# Patient Record
Sex: Female | Born: 1990 | Race: White | Hispanic: No | Smoking: Former smoker
Health system: Southern US, Community
[De-identification: ages and names within clinical notes are randomized; demographics above are authoritative.]

## PROBLEM LIST (undated history)

## (undated) ENCOUNTER — Inpatient Hospital Stay (HOSPITAL_COMMUNITY): Payer: Self-pay

## (undated) ENCOUNTER — Inpatient Hospital Stay (HOSPITAL_COMMUNITY)

## (undated) ENCOUNTER — Emergency Department (HOSPITAL_COMMUNITY): Payer: Medicaid Other

## (undated) DIAGNOSIS — IMO0002 Reserved for concepts with insufficient information to code with codable children: Secondary | ICD-10-CM

## (undated) DIAGNOSIS — T7411XA Adult physical abuse, confirmed, initial encounter: Secondary | ICD-10-CM

## (undated) DIAGNOSIS — F4312 Post-traumatic stress disorder, chronic: Secondary | ICD-10-CM

## (undated) DIAGNOSIS — R112 Nausea with vomiting, unspecified: Secondary | ICD-10-CM

## (undated) DIAGNOSIS — D219 Benign neoplasm of connective and other soft tissue, unspecified: Secondary | ICD-10-CM

## (undated) DIAGNOSIS — F410 Panic disorder [episodic paroxysmal anxiety] without agoraphobia: Secondary | ICD-10-CM

## (undated) DIAGNOSIS — F32A Depression, unspecified: Secondary | ICD-10-CM

## (undated) DIAGNOSIS — N390 Urinary tract infection, site not specified: Secondary | ICD-10-CM

## (undated) DIAGNOSIS — M329 Systemic lupus erythematosus, unspecified: Secondary | ICD-10-CM

## (undated) DIAGNOSIS — Z9889 Other specified postprocedural states: Secondary | ICD-10-CM

## (undated) DIAGNOSIS — F419 Anxiety disorder, unspecified: Secondary | ICD-10-CM

## (undated) DIAGNOSIS — F329 Major depressive disorder, single episode, unspecified: Secondary | ICD-10-CM

## (undated) HISTORY — DX: Adult physical abuse, confirmed, initial encounter: T74.11XA

## (undated) HISTORY — DX: Panic disorder (episodic paroxysmal anxiety): F41.0

## (undated) HISTORY — PX: CHOLECYSTECTOMY: SHX55

## (undated) HISTORY — DX: Post-traumatic stress disorder, chronic: F43.12

## (undated) HISTORY — PX: FOREARM SURGERY: SHX651

---
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
## 2011-01-18 ENCOUNTER — Inpatient Hospital Stay (HOSPITAL_COMMUNITY)
Admission: AD | Admit: 2011-01-18 | Discharge: 2011-01-18 | Disposition: A | Discharge status: Home / Self Care | Payer: Self-pay | Source: Ambulatory Visit | Attending: Obstetrics & Gynecology | Admitting: Obstetrics & Gynecology

## 2011-01-18 ENCOUNTER — Inpatient Hospital Stay (HOSPITAL_COMMUNITY): Payer: Self-pay

## 2011-01-18 DIAGNOSIS — N940 Mittelschmerz: Secondary | ICD-10-CM | POA: Insufficient documentation

## 2011-01-18 DIAGNOSIS — N946 Dysmenorrhea, unspecified: Secondary | ICD-10-CM | POA: Insufficient documentation

## 2011-01-18 DIAGNOSIS — R109 Unspecified abdominal pain: Secondary | ICD-10-CM | POA: Insufficient documentation

## 2011-01-18 LAB — CBC
HCT: 39.4 % (ref 36.0–46.0)
Hemoglobin: 13.2 g/dL (ref 12.0–15.0)
MCH: 30.8 pg (ref 26.0–34.0)
MCV: 92.1 fL (ref 78.0–100.0)
RBC: 4.28 MIL/uL (ref 3.87–5.11)

## 2011-01-18 LAB — URINALYSIS, ROUTINE W REFLEX MICROSCOPIC
Bilirubin Urine: NEGATIVE
Glucose, UA: NEGATIVE mg/dL
Ketones, ur: NEGATIVE mg/dL
pH: 6.5 (ref 5.0–8.0)

## 2011-01-18 LAB — WET PREP, GENITAL

## 2011-01-18 MED ORDER — KETOROLAC TROMETHAMINE 10 MG PO TABS: 10.0000 mg | ORAL_TABLET | Freq: Four times a day (QID) | ORAL | Status: AC | PRN

## 2011-01-18 NOTE — Progress Notes (Signed)
Pt called from triage, no response.  No longer visiting pt in rm 7

## 2011-01-18 NOTE — ED Provider Notes (Signed)
History   The patient is a 20 y.o. year old female who presents to [facility] reporting increased, constant cramping after removal of Implanon 6 weeks ago. Pain primarily suprapubic and > on right. She reports having this pain prior to removal of the Implanon, but the pain has now increased. She  reports having a GI work-up w/ Dx IBS, but does not think that the pain is GI-related. She denies risk of STD exposure, Hx STDs or vaginal discharge. The pain is 4-8/10, well-controlled w/ Ibuprofen, but the pt worries about regular Ibuprofen use. 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 She declines pain meds at this visit.   CSN: [id number] Arrival date & time: 01/18/2011  1:17 PM  Chief Complaint  Patient presents with  . Vaginal Bleeding  . Dysmenorrhea    HPI  (Consider location/radiation/quality/duration/timing/severity/associated sxs/prior treatment)  HPI  No past medical history on file. Rx pain med dependence  No past surgical history on file.  No family history on file.  History  Substance Use Topics  . Smoking status: Not on file  . Smokeless tobacco: Not on file  . Alcohol Use: Not on file    OB History    No data available      Review of Systems  Review of Systems  She denies dysuria, frequency, flank pain, fever, chills vomiting , diarrhea or constipation. She reports a few episodes of nausea.    Allergies  Review of patient's allergies indicates no known allergies.  Home Medications  No current outpatient prescriptions on file.  Physical Exam    BP 113/76  Pulse 87  Temp(Src) 98.6 F (37 C) (Oral)  Resp 20  Ht 5' 5.5" (1.664 m)  Wt 82.328 kg (181 lb 8 oz)  BMI 29.74 kg/m2  LMP 01/09/2011 Abdomen: Soft, NT, no masses, guarding. +BS. No CVAT Pelvic: BUS normal. Vagina no lesions, discharge or VB. No CMT. Mild suprapubic and R adnexal tenderness. Cervix non-friable, no discharge. Uterus: Normal size, ML, NT. Ovaries non-palpable  US Transvaginal  Non-ob  01/18/2011  *RADIOLOGY REPORT*  Clinical Data: Right lower quadrant pelvic pain.  TRANSABDOMINAL AND TRANSVAGINAL ULTRASOUND OF PELVIS Technique:  Both transabdominal and transvaginal ultrasound examinations of the pelvis were performed. Transabdominal technique was performed for global imaging of the pelvis including uterus, ovaries, adnexal regions, and pelvic cul-de-sac.  Comparison: None.   It was necessary to proceed with endovaginal exam following the transabdominal exam to visualize the left ovary, not seen transabdominally.  Findings:  Uterus: 5.8 x 3.8 x 2.4 cm.  Anteverted, anteflexed.  No focal abnormality.  Endometrium: 7 mm.  Uniformly trilaminar in appearance.  Right ovary:  2.5 x 1.6 x 1.6 centimeters.  Normal.  Left ovary: 3.0 x 1.2 x 1.1 cm.  Normal.  Other findings: No free fluid  IMPRESSION: Normal study. No evidence of pelvic mass or other significant abnormality.  Original Report Authenticated By: Harrel Lemon, M.D.   Physical Exam  ED Course  Procedures (including critical care time)   Labs Reviewed  URINALYSIS, ROUTINE W REFLEX MICROSCOPIC  POCT PREGNANCY, URINE  CBC  POCT PREGNANCY, URINE  POCT URINE PREGNANCY  GC/CHLAMYDIA PROBE AMP, GENITAL  WET PREP, GENITAL    Assessment: 1. Dysmenorrhea/mittelschmertz vs ?interstitial cyctitis vs IBS  Plan: 1. If pain continues after three months off Implanon, recommend F/U w/ PCP. If pain worsens emergently, go to [facility] or [facility] PRN. 2. Rx Toradol #20. Discussed non-pharmacologic pain management

## 2011-01-18 NOTE — Progress Notes (Signed)
Pt states, " I had my implant taken out 6 wks ago, and I had a light period for 2 days  (August 15 th) . I started having cramping pain 3 1/2 wk after taking it out, and it has gotten worse in the past 2 wks and more on my right lower side.  I had another two day period ( Sept 15) .

## 2013-01-01 ENCOUNTER — Encounter (HOSPITAL_COMMUNITY): Payer: Self-pay

## 2013-01-01 ENCOUNTER — Emergency Department (HOSPITAL_COMMUNITY)
Admission: EM | Admit: 2013-01-01 | Discharge: 2013-01-01 | Disposition: A | Discharge status: Home / Self Care | Payer: Medicaid Other | Attending: Emergency Medicine | Admitting: Emergency Medicine

## 2013-01-01 ENCOUNTER — Emergency Department (HOSPITAL_COMMUNITY): Payer: Medicaid Other

## 2013-01-01 DIAGNOSIS — F3289 Other specified depressive episodes: Secondary | ICD-10-CM | POA: Insufficient documentation

## 2013-01-01 DIAGNOSIS — R319 Hematuria, unspecified: Secondary | ICD-10-CM | POA: Insufficient documentation

## 2013-01-01 DIAGNOSIS — F329 Major depressive disorder, single episode, unspecified: Secondary | ICD-10-CM | POA: Insufficient documentation

## 2013-01-01 DIAGNOSIS — Z79899 Other long term (current) drug therapy: Secondary | ICD-10-CM | POA: Insufficient documentation

## 2013-01-01 DIAGNOSIS — F411 Generalized anxiety disorder: Secondary | ICD-10-CM | POA: Insufficient documentation

## 2013-01-01 HISTORY — DX: Major depressive disorder, single episode, unspecified: F32.9

## 2013-01-01 HISTORY — DX: Anxiety disorder, unspecified: F41.9

## 2013-01-01 HISTORY — DX: Depression, unspecified: F32.A

## 2013-01-01 LAB — URINALYSIS, ROUTINE W REFLEX MICROSCOPIC
Ketones, ur: NEGATIVE mg/dL
Nitrite: NEGATIVE
Specific Gravity, Urine: 1.008 (ref 1.005–1.030)
pH: 7.5 (ref 5.0–8.0)

## 2013-01-01 LAB — URINE MICROSCOPIC-ADD ON

## 2013-01-01 MED ORDER — CEPHALEXIN 500 MG PO CAPS: 500.0000 mg | ORAL_CAPSULE | Freq: Four times a day (QID) | ORAL | Status: DC

## 2013-01-01 NOTE — ED Provider Notes (Signed)
CSN: [id number]     Arrival date & time 01/01/13  0554 History   First MD Initiated Contact with Patient 01/01/13 [phone]     Chief Complaint  Patient presents with  . Flank Pain   (Consider location/radiation/quality/duration/timing/severity/associated sxs/prior Treatment) Patient is a 22 y.o. female presenting with dysuria. The history is provided by the patient. No language interpreter was used.  Dysuria Pain quality:  Aching Pain severity:  Mild Onset quality:  Gradual Duration:  1 week Timing:  Constant Progression:  Worsening Chronicity:  New Recent urinary tract infections: no   Relieved by:  Nothing Worsened by:  Nothing tried Ineffective treatments: keflex and macrobid. Urinary symptoms: frequent urination and hematuria   Pt reports she was diagnosed with a uti 1 week ago.  Pt complains of continued frequency, pressure sensation.  Pt complains of burning with urination  Past Medical History  Diagnosis Date  . Anxiety   . Depression    Past Surgical History  Procedure Laterality Date  . Forearm surgery     No family history on file. History  Substance Use Topics  . Smoking status: Never Smoker   . Smokeless tobacco: Not on file  . Alcohol Use: No   OB History   Grav Para Term Preterm Abortions TAB SAB Ect Mult Living   1              Review of Systems  Genitourinary: Positive for dysuria and frequency.  All other systems reviewed and are negative.    Allergies  Hydrocodone and Morphine and related  Home Medications   Current Outpatient Rx  Name  Route  Sig  Dispense  Refill  . acetaminophen (TYLENOL) 500 MG tablet   Oral   Take 500 mg by mouth every 6 (six) hours as needed. Patient took this medication for pain.          Marland Kitchen aspirin-acetaminophen-caffeine (EXCEDRIN MIGRAINE) 250-250-65 MG per tablet   Oral   Take 1 tablet by mouth every 6 (six) hours as needed.           . diazepam (VALIUM) 10 MG tablet   Oral   Take 10 mg by mouth every 6  (six) hours as needed.           . meclizine (ANTIVERT) 12.5 MG tablet   Oral   Take 12.5 mg by mouth 3 (three) times daily as needed.           . traZODone (DESYREL) 50 MG tablet   Oral   Take 50 mg by mouth at bedtime.            BP 135/77  Pulse 78  Temp(Src) 98 F (36.7 C)  Resp 18  SpO2 96% Physical Exam  Nursing note and vitals reviewed. Constitutional: She appears well-developed and well-nourished.  HENT:  Head: Normocephalic.  Right Ear: External ear normal.  Left Ear: External ear normal.  Nose: Nose normal.  Mouth/Throat: Oropharynx is clear and moist.  Eyes: Conjunctivae are normal. Pupils are equal, round, and reactive to light.  Neck: Normal range of motion. Neck supple.  Cardiovascular: Normal rate and normal heart sounds.   Pulmonary/Chest: Effort normal.  Abdominal: Soft. Bowel sounds are normal.  Musculoskeletal: Normal range of motion.  Neurological: She is alert.  Skin: Skin is warm.  Psychiatric: She has a normal mood and affect.    ED Course  Procedures (including critical care time) Labs Review Labs Reviewed  URINALYSIS, ROUTINE W REFLEX MICROSCOPIC  Imaging Review No results found.  MDM   1. Hematuria    Urine shows rbc's  Tntc.  Ultrasound shows no hydro, no evidence of stones.    Pt advised continue keflex.   Pt advised to see her OB/Gyn for follow up,  Keflex 500mg  one po qid Pt request pain medication.  Pt reports hydrocodone not strong enough.   I counseled pt,  I advised tylenol.   I don not think narcotics for appropriate for discomforts of pregnancy.     Lonia Skinner [place], PA-C 01/01/13 1308  Elson Areas, PA-C 01/01/13 0824  Elson Areas, PA-C 01/01/13 [phone]

## 2013-01-01 NOTE — ED Notes (Signed)
0600  Pt arrives from home with right flank pain.  Pt was diagnosed with kidney infection at [facility] 1 week ago and was admitted and discharged on Friday.  Pt also thinks she is allergic to her antibiotic.  Pt does not feel any better.  Also has nausea with painful urination still

## 2013-01-01 NOTE — ED Notes (Signed)
Returned from [place]-- continues to c/o pain in right flank area. States was sent home with Vicodin-- doesn't like taking it, because it makes her feel like she has pins and needles in hands and feet.

## 2013-01-01 NOTE — ED Notes (Signed)
Report received from Andrew, RN

## 2013-01-03 NOTE — ED Provider Notes (Signed)
Medical screening examination/treatment/procedure(s) were performed by non-physician practitioner and as supervising physician I was immediately available for consultation/collaboration.  Nyriah Coote R. Lailah Marcelli, MD 01/03/13 2344 

## 2014-02-25 ENCOUNTER — Encounter (HOSPITAL_COMMUNITY): Payer: Self-pay

## 2015-09-17 DIAGNOSIS — R011 Cardiac murmur, unspecified: Secondary | ICD-10-CM | POA: Insufficient documentation

## 2015-10-01 ENCOUNTER — Encounter: Payer: Self-pay | Admitting: Cardiovascular Disease

## 2015-10-02 ENCOUNTER — Telehealth: Payer: Self-pay | Admitting: Cardiovascular Disease

## 2015-10-02 NOTE — Telephone Encounter (Signed)
Received records from [facility] for appointment on 10/16/15 with Dr Gwenlyn Found.  Records given to [facility] (medical records) for Dr Kennon Holter schedule on 10/16/15. lp

## 2015-10-07 ENCOUNTER — Ambulatory Visit: Payer: Medicaid Other | Admitting: Cardiovascular Disease

## 2015-10-16 ENCOUNTER — Encounter: Payer: Self-pay | Admitting: *Deleted

## 2015-10-16 ENCOUNTER — Ambulatory Visit: Payer: Medicaid Other | Admitting: Cardiovascular Disease

## 2016-10-27 ENCOUNTER — Emergency Department (HOSPITAL_COMMUNITY)
Admission: EM | Admit: 2016-10-27 | Discharge: 2016-10-28 | Disposition: A | Discharge status: Home / Self Care | Payer: Medicaid Other | Attending: Emergency Medicine | Admitting: Emergency Medicine

## 2016-10-27 ENCOUNTER — Emergency Department (HOSPITAL_COMMUNITY): Payer: Medicaid Other

## 2016-10-27 ENCOUNTER — Encounter (HOSPITAL_COMMUNITY): Payer: Self-pay | Admitting: Vascular Surgery

## 2016-10-27 DIAGNOSIS — Z87891 Personal history of nicotine dependence: Secondary | ICD-10-CM | POA: Diagnosis not present

## 2016-10-27 DIAGNOSIS — Z79899 Other long term (current) drug therapy: Secondary | ICD-10-CM | POA: Diagnosis not present

## 2016-10-27 DIAGNOSIS — R0789 Other chest pain: Secondary | ICD-10-CM

## 2016-10-27 DIAGNOSIS — Y9389 Activity, other specified: Secondary | ICD-10-CM | POA: Diagnosis not present

## 2016-10-27 DIAGNOSIS — S161XXA Strain of muscle, fascia and tendon at neck level, initial encounter: Secondary | ICD-10-CM | POA: Diagnosis not present

## 2016-10-27 DIAGNOSIS — G4489 Other headache syndrome: Secondary | ICD-10-CM

## 2016-10-27 DIAGNOSIS — Y929 Unspecified place or not applicable: Secondary | ICD-10-CM | POA: Insufficient documentation

## 2016-10-27 DIAGNOSIS — R079 Chest pain, unspecified: Secondary | ICD-10-CM | POA: Diagnosis present

## 2016-10-27 DIAGNOSIS — E876 Hypokalemia: Secondary | ICD-10-CM | POA: Diagnosis not present

## 2016-10-27 DIAGNOSIS — Y999 Unspecified external cause status: Secondary | ICD-10-CM | POA: Diagnosis not present

## 2016-10-27 LAB — BASIC METABOLIC PANEL
Anion gap: 7 (ref 5–15)
BUN: 6 mg/dL (ref 6–20)
CALCIUM: 9.2 mg/dL (ref 8.9–10.3)
CHLORIDE: 110 mmol/L (ref 101–111)
CO2: 24 mmol/L (ref 22–32)
CREATININE: 0.65 mg/dL (ref 0.44–1.00)
Glucose, Bld: 83 mg/dL (ref 65–99)
Potassium: 2.9 mmol/L — ABNORMAL LOW (ref 3.5–5.1)
SODIUM: 141 mmol/L (ref 135–145)

## 2016-10-27 LAB — CBC
HCT: 40.6 % (ref 36.0–46.0)
HEMOGLOBIN: 13.2 g/dL (ref 12.0–15.0)
MCH: 29.8 pg (ref 26.0–34.0)
MCHC: 32.5 g/dL (ref 30.0–36.0)
MCV: 91.6 fL (ref 78.0–100.0)
Platelets: 203 10*3/uL (ref 150–400)
RBC: 4.43 MIL/uL (ref 3.87–5.11)
RDW: 13.2 % (ref 11.5–15.5)
WBC: 8.1 10*3/uL (ref 4.0–10.5)

## 2016-10-27 LAB — I-STAT BETA HCG BLOOD, ED (MC, WL, AP ONLY)

## 2016-10-27 LAB — I-STAT TROPONIN, ED: TROPONIN I, POC: 0 ng/mL (ref 0.00–0.08)

## 2016-10-27 MED ORDER — IOPAMIDOL (ISOVUE-300) INJECTION 61%
INTRAVENOUS | Status: AC
  Administered 2016-10-27: 75 mL
  Filled 2016-10-27: qty 75

## 2016-10-27 MED ORDER — POTASSIUM CHLORIDE 10 MEQ/100ML IV SOLN
10.0000 meq | Freq: Once | INTRAVENOUS | Status: AC
  Administered 2016-10-27: 10 meq via INTRAVENOUS
  Filled 2016-10-27: qty 100

## 2016-10-27 MED ORDER — IOPAMIDOL (ISOVUE-300) INJECTION 61%
INTRAVENOUS | Status: AC
  Filled 2016-10-27: qty 75

## 2016-10-27 MED ORDER — SODIUM CHLORIDE 0.9 % IV SOLN
Freq: Once | INTRAVENOUS | Status: AC
  Administered 2016-10-27: via INTRAVENOUS

## 2016-10-27 MED ORDER — ONDANSETRON HCL 4 MG/2ML IJ SOLN
4.0000 mg | Freq: Once | INTRAMUSCULAR | Status: AC
  Administered 2016-10-27: 4 mg via INTRAVENOUS
  Filled 2016-10-27: qty 2

## 2016-10-27 MED ORDER — KETOROLAC TROMETHAMINE 15 MG/ML IJ SOLN
15.0000 mg | Freq: Once | INTRAMUSCULAR | Status: AC
  Administered 2016-10-28: 15 mg via INTRAVENOUS
  Filled 2016-10-27: qty 1

## 2016-10-27 NOTE — ED Notes (Signed)
Patient transported to CT 
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
## 2016-10-27 NOTE — ED Notes (Signed)
Pt now reporting to NP that she is experiencing some chest heaviness and mild SOB.

## 2016-10-28 MED ORDER — POTASSIUM CHLORIDE CRYS ER 20 MEQ PO TBCR: 20.0000 meq | EXTENDED_RELEASE_TABLET | Freq: Two times a day (BID) | ORAL | 0 refills | Status: DC

## 2016-10-28 MED ORDER — CYCLOBENZAPRINE HCL 10 MG PO TABS: 10.0000 mg | ORAL_TABLET | Freq: Two times a day (BID) | ORAL | 0 refills | Status: DC | PRN

## 2016-10-28 MED ORDER — NAPROXEN 500 MG PO TABS: 500.0000 mg | ORAL_TABLET | Freq: Two times a day (BID) | ORAL | 0 refills | Status: DC | PRN

## 2016-10-28 MED ORDER — ONDANSETRON HCL 4 MG PO TABS: 4.0000 mg | ORAL_TABLET | Freq: Four times a day (QID) | ORAL | 0 refills | Status: DC | PRN

## 2016-12-05 ENCOUNTER — Emergency Department (HOSPITAL_COMMUNITY)
Admission: EM | Admit: 2016-12-05 | Discharge: 2016-12-05 | Disposition: A | Discharge status: Home / Self Care | Payer: Medicaid Other | Attending: Emergency Medicine | Admitting: Emergency Medicine

## 2016-12-05 ENCOUNTER — Encounter (HOSPITAL_COMMUNITY): Payer: Self-pay

## 2016-12-05 ENCOUNTER — Emergency Department (HOSPITAL_COMMUNITY): Payer: Medicaid Other

## 2016-12-05 DIAGNOSIS — Z885 Allergy status to narcotic agent status: Secondary | ICD-10-CM | POA: Insufficient documentation

## 2016-12-05 DIAGNOSIS — K8020 Calculus of gallbladder without cholecystitis without obstruction: Secondary | ICD-10-CM | POA: Diagnosis not present

## 2016-12-05 DIAGNOSIS — Z79899 Other long term (current) drug therapy: Secondary | ICD-10-CM | POA: Insufficient documentation

## 2016-12-05 DIAGNOSIS — R1011 Right upper quadrant pain: Secondary | ICD-10-CM

## 2016-12-05 LAB — URINALYSIS, ROUTINE W REFLEX MICROSCOPIC
Bilirubin Urine: NEGATIVE
Glucose, UA: NEGATIVE mg/dL
HGB URINE DIPSTICK: NEGATIVE
KETONES UR: NEGATIVE mg/dL
Leukocytes, UA: NEGATIVE
Nitrite: NEGATIVE
PROTEIN: NEGATIVE mg/dL
Specific Gravity, Urine: 1.01 (ref 1.005–1.030)
pH: 7 (ref 5.0–8.0)

## 2016-12-05 LAB — COMPREHENSIVE METABOLIC PANEL
ALBUMIN: 3.9 g/dL (ref 3.5–5.0)
ALK PHOS: 49 U/L (ref 38–126)
ALT: 36 U/L (ref 14–54)
AST: 31 U/L (ref 15–41)
Anion gap: 7 (ref 5–15)
BUN: 7 mg/dL (ref 6–20)
CALCIUM: 9.2 mg/dL (ref 8.9–10.3)
CO2: 27 mmol/L (ref 22–32)
CREATININE: 0.74 mg/dL (ref 0.44–1.00)
Chloride: 107 mmol/L (ref 101–111)
GFR calc non Af Amer: >60 mL/min (ref >60)
Glucose, Bld: 98 mg/dL (ref 65–99)
Potassium: 3.9 mmol/L (ref 3.5–5.1)
SODIUM: 141 mmol/L (ref 135–145)
Total Bilirubin: 0.9 mg/dL (ref 0.3–1.2)
Total Protein: 7.4 g/dL (ref 6.5–8.1)

## 2016-12-05 LAB — CBC
HCT: 39 % (ref 36.0–46.0)
Hemoglobin: 13 g/dL (ref 12.0–15.0)
MCH: 29.7 pg (ref 26.0–34.0)
MCHC: 33.3 g/dL (ref 30.0–36.0)
MCV: 89.2 fL (ref 78.0–100.0)
PLATELETS: 202 10*3/uL (ref 150–400)
RBC: 4.37 MIL/uL (ref 3.87–5.11)
RDW: 12.7 % (ref 11.5–15.5)
WBC: 5 10*3/uL (ref 4.0–10.5)

## 2016-12-05 LAB — I-STAT BETA HCG BLOOD, ED (MC, WL, AP ONLY)

## 2016-12-05 LAB — LIPASE, BLOOD: Lipase: 30 U/L (ref 11–51)

## 2016-12-05 MED ORDER — FENTANYL CITRATE (PF) 100 MCG/2ML IJ SOLN
50.0000 ug | Freq: Once | INTRAMUSCULAR | Status: AC
  Administered 2016-12-05: 50 ug via INTRAVENOUS
  Filled 2016-12-05: qty 2

## 2016-12-05 MED ORDER — PROMETHAZINE HCL 25 MG PO TABS: 25.0000 mg | ORAL_TABLET | Freq: Four times a day (QID) | ORAL | 0 refills | Status: DC | PRN

## 2016-12-05 MED ORDER — HYDROMORPHONE HCL 1 MG/ML IJ SOLN
1.0000 mg | Freq: Once | INTRAMUSCULAR | Status: AC
  Administered 2016-12-05: 1 mg via INTRAVENOUS
  Filled 2016-12-05: qty 1

## 2016-12-05 MED ORDER — ONDANSETRON HCL 4 MG/2ML IJ SOLN
4.0000 mg | Freq: Once | INTRAMUSCULAR | Status: AC
Start: 2016-12-05 — End: 2016-12-05
  Administered 2016-12-05: 4 mg via INTRAVENOUS
  Filled 2016-12-05: qty 2

## 2016-12-05 MED ORDER — OXYCODONE-ACETAMINOPHEN 5-325 MG PO TABS: 1.0000 | ORAL_TABLET | ORAL | 0 refills | Status: DC | PRN

## 2016-12-05 NOTE — ED Provider Notes (Signed)
[facility] DEPT Provider Note   CSN: [id number] Arrival date & time: 12/05/16  1537     History   Chief Complaint Chief Complaint  Patient presents with  . Abdominal Pain    HPI Felicia Davila is a 26 y.o. female.  The history is provided by the patient and medical records.  Abdominal Pain   Associated symptoms include nausea and vomiting.    25 year old female with history of anxiety and depression, presenting to the ED with right upper quadrant abdominal pain. Patient reports a few years ago when she was pregnant with her son she was diagnosed with gallstones. States she has not had any real issues until the past few days.  States over the past few days she has been having nagging pain in the RUQ that has started now wrapped around to the flank.  She reports associated nausea/vomiting.  States symptoms got worse yesterday after eating [employer].  States no follow-up with the gallstones since pregnancy.  Prior c-section x2, no other surgeries.  Has tried ODT zofran at home without relief.  Past Medical History:  Diagnosis Date  . Anxiety   . Depression     There are no active problems to display for this patient.   Past Surgical History:  Procedure Laterality Date  . CESAREAN SECTION    . FOREARM SURGERY      OB History    Gravida Para Term Preterm AB Living   1             SAB TAB Ectopic Multiple Live Births                   Home Medications    Prior to Admission medications   Medication Sig Start Date End Date Taking? Authorizing Provider  cephALEXin (KEFLEX) 500 MG capsule Take 500 mg by mouth 4 (four) times daily.    [provider]  cephALEXin (KEFLEX) 500 MG capsule Take 1 capsule (500 mg total) by mouth 4 (four) times daily. 01/01/13   Fransico Meadow, PA-C  cyclobenzaprine (FLEXERIL) 10 MG tablet Take 1 tablet (10 mg total) by mouth 2 (two) times daily as needed for muscle spasms. 10/28/16   Ashley Murrain, NP  FLUoxetine (PROZAC) 40 MG  capsule Take 40 mg by mouth daily.    [provider]  naproxen (NAPROSYN) 500 MG tablet Take 1 tablet (500 mg total) by mouth 2 (two) times daily as needed for moderate pain. 10/28/16   Ashley Murrain, NP  nitrofurantoin, macrocrystal-monohydrate, (MACROBID) 100 MG capsule Take 100 mg by mouth 2 (two) times daily.    [provider]  ondansetron (ZOFRAN) 4 MG tablet Take 1 tablet (4 mg total) by mouth every 6 (six) hours as needed for nausea or vomiting. 10/28/16   Ashley Murrain, NP  potassium chloride SA (K-DUR,KLOR-CON) 20 MEQ tablet Take 1 tablet (20 mEq total) by mouth 2 (two) times daily. 10/28/16   Ashley Murrain, NP    Family History History reviewed. No pertinent family history.  Social History Social History  Substance Use Topics  . Smoking status: Former Research scientist (life sciences)  . Smokeless tobacco: Never Used  . Alcohol use No     Allergies   Morphine and related and Hydrocodone   Review of Systems Review of Systems  Gastrointestinal: Positive for abdominal pain, nausea and vomiting.  All other systems reviewed and are negative.    Physical Exam Updated Vital Signs BP 123/81 (BP Location: Right Arm)  Pulse 78   Temp 98.3 F (36.8 C) (Oral)   Resp 16   Ht 5\' 7"  (1.702 m)   Wt 65.8 kg (145 lb)   SpO2 100%   BMI 22.71 kg/m   Physical Exam  Constitutional: She is oriented to person, place, and time. She appears well-developed and well-nourished.  HENT:  Head: Normocephalic and atraumatic.  Mouth/Throat: Oropharynx is clear and moist.  Eyes: Pupils are equal, round, and reactive to light. Conjunctivae and EOM are normal.  Neck: Normal range of motion.  Cardiovascular: Normal rate, regular rhythm and normal heart sounds.   Pulmonary/Chest: Effort normal and breath sounds normal.  Abdominal: Soft. Bowel sounds are normal. There is tenderness in the right upper quadrant. There is positive Murphy's sign. There is no CVA tenderness.  Tenderness in the right upper  quadrant with positive Murphy sign, does appear to be some radiation of pain into the right flank, no focal CVA tenderness  Musculoskeletal: Normal range of motion.  Neurological: She is alert and oriented to person, place, and time.  Skin: Skin is warm and dry.  Psychiatric: She has a normal mood and affect.  Nursing note and vitals reviewed.    ED Treatments / Results  Labs (all labs ordered are listed, but only abnormal results are displayed) Labs Reviewed  URINALYSIS, ROUTINE W REFLEX MICROSCOPIC - Abnormal; Notable for the following:       Result Value   APPearance HAZY (*)    All other components within normal limits  LIPASE, BLOOD  COMPREHENSIVE METABOLIC PANEL  CBC  I-STAT BETA HCG BLOOD, ED ([facility], [facility], [facility] ONLY)    EKG  EKG Interpretation None       Radiology US Abdomen Limited Ruq  Result Date: 12/05/2016 CLINICAL DATA:  Right upper quadrant abdominal pain EXAM: ULTRASOUND ABDOMEN LIMITED RIGHT UPPER QUADRANT COMPARISON:  10/19/2014 FINDINGS: Gallbladder: Shadowing gallstone present. Slightly contracted. This measures up to 1.2 cm. Mild wall thickening up to 3.7 mm. Negative sonographic Murphy's. Common bile duct: Diameter: 1.5 mm Liver: Increased echogenicity.  No focal abnormality is seen. IMPRESSION: 1. Cholelithiasis. Gallbladder slightly contracted which may be contributing to slightly thickened appearance of the gallbladder wall, although acute or chronic cholecystitis could also produce this appearance. No focal tenderness elicited. Negative for biliary dilatation. 2. Echogenic liver consistent with fatty change Electronically Signed   By: Donavan Foil M.D.   On: 12/05/2016 18:21    Procedures Procedures (including critical care time)  Medications Ordered in ED Medications  fentaNYL (SUBLIMAZE) injection 50 mcg (50 mcg Intravenous Given 12/05/16 1732)  ondansetron (ZOFRAN) injection 4 mg (4 mg Intravenous Given 12/05/16 1730)  HYDROmorphone (DILAUDID) injection  1 mg (1 mg Intravenous Given 12/05/16 1912)     Initial Impression / Assessment and Plan / ED Course  I have reviewed the triage vital signs and the nursing notes.  Pertinent labs & imaging results that were available during my care of the patient were reviewed by me and considered in my medical decision making (see chart for details).  26 year old female here with right upper quadrant pain for the past few days, worse after eating [employer] yesterday. She is afebrile and nontoxic in appearance here. Abdominal exam with tenderness in the right upper quadrant, positive Murphy sign. Screening labs were sent from triage and are overall reassuring. She has history of gallstones, will order ultrasound.  Pain and nausea meds ordered.  Ultrasound revealing gallstones, gallbladder slightly contracted, mildly thickened appearance of the gallbladder wall. Question of  acute versus chronically cystitis. Patient received second dose of pain medicine and is now comfortable. She's not had any active emesis here. Will discuss with general surgery.  7:45 PM Discussed with general surgery, Dr. Grandville Silos-- agrees that patient stable enough to go home as there is full case load/pushed cases from earlier today that will occupy schedule for the next 2 days.  She can follow-up in clinic on Monday.  Discussed this with patient, she is comfortable well with this. We'll plan to discharge home with pain and nausea medicine. She understands to return here for any new or worsening symptoms including worsening pain, uncontrolled vomiting, trouble eating, high fever, etc. Patient discharged home in stable condition.  Final Clinical Impressions(s) / ED Diagnoses   Final diagnoses:  RUQ pain  Symptomatic cholelithiasis    New Prescriptions Discharge Medication List as of 12/05/2016  7:57 PM    START taking these medications   Details  oxyCODONE-acetaminophen (PERCOCET) 5-325 MG tablet Take 1 tablet by mouth every 4 (four)  hours as needed., Starting Sun 12/05/2016, Print    promethazine (PHENERGAN) 25 MG tablet Take 1 tablet (25 mg total) by mouth every 6 (six) hours as needed for nausea or vomiting., Starting Sun 12/05/2016, Print         Larene Pickett, PA-C 12/05/16 2118    Julianne Rice, MD 12/10/16 2348

## 2016-12-05 NOTE — Discharge Instructions (Signed)
Take the prescribed medication as directed.  Follow low-fat diet for the next few days to plan worsening flares. Follow-up with the surgery clinic tomorrow if possible.: The morning to schedule an appointment. Return to the ED for new or worsening symptoms-- increased pain, uncontrolled vomiting, high fever, etc.

## 2016-12-05 NOTE — ED Triage Notes (Signed)
Pt states she has had RUQ pain for several days. She reports the pain has gotten gradually worse to the point of not being able to lift her children. Pt states hx of gall stones. N/v as well. Pt reports minimal relief with zofran at home.

## 2016-12-09 ENCOUNTER — Encounter (HOSPITAL_COMMUNITY): Payer: Self-pay | Admitting: Emergency Medicine

## 2016-12-09 DIAGNOSIS — R112 Nausea with vomiting, unspecified: Secondary | ICD-10-CM | POA: Insufficient documentation

## 2016-12-09 DIAGNOSIS — R109 Unspecified abdominal pain: Secondary | ICD-10-CM | POA: Diagnosis present

## 2016-12-09 DIAGNOSIS — Z885 Allergy status to narcotic agent status: Secondary | ICD-10-CM | POA: Diagnosis not present

## 2016-12-09 LAB — I-STAT BETA HCG BLOOD, ED (MC, WL, AP ONLY): I-stat hCG, quantitative: <5 m[IU]/mL (ref <5)

## 2016-12-09 LAB — CBC
HEMATOCRIT: 35.2 % — AB (ref 36.0–46.0)
Hemoglobin: 11.6 g/dL — ABNORMAL LOW (ref 12.0–15.0)
MCH: 29.4 pg (ref 26.0–34.0)
MCHC: 33 g/dL (ref 30.0–36.0)
MCV: 89.3 fL (ref 78.0–100.0)
PLATELETS: 191 10*3/uL (ref 150–400)
RBC: 3.94 MIL/uL (ref 3.87–5.11)
RDW: 12.3 % (ref 11.5–15.5)
WBC: 4.3 10*3/uL (ref 4.0–10.5)

## 2016-12-09 MED ORDER — OXYCODONE-ACETAMINOPHEN 5-325 MG PO TABS
1.0000 | ORAL_TABLET | Freq: Once | ORAL | Status: AC
  Administered 2016-12-09: 1 via ORAL

## 2016-12-09 MED ORDER — ONDANSETRON 4 MG PO TBDP
4.0000 mg | ORAL_TABLET | Freq: Once | ORAL | Status: AC | PRN
  Administered 2016-12-09: 4 mg via ORAL

## 2016-12-09 MED ORDER — ONDANSETRON 4 MG PO TBDP
ORAL_TABLET | ORAL | Status: AC
  Filled 2016-12-09: qty 1

## 2016-12-09 MED ORDER — OXYCODONE-ACETAMINOPHEN 5-325 MG PO TABS
ORAL_TABLET | ORAL | Status: AC
  Filled 2016-12-09: qty 1

## 2016-12-09 NOTE — ED Triage Notes (Signed)
Patient reports upper abdominal pain with nausea this evening , S/P cholecystectomy last Monday at [facility] , fever today , afebrile at triage . No diarrhea or emesis .

## 2016-12-10 ENCOUNTER — Emergency Department (HOSPITAL_COMMUNITY)
Admission: EM | Admit: 2016-12-10 | Discharge: 2016-12-10 | Disposition: A | Discharge status: Home / Self Care | Payer: Medicaid Other | Attending: Emergency Medicine | Admitting: Emergency Medicine

## 2016-12-10 ENCOUNTER — Emergency Department (HOSPITAL_COMMUNITY): Payer: Medicaid Other

## 2016-12-10 DIAGNOSIS — R109 Unspecified abdominal pain: Secondary | ICD-10-CM

## 2016-12-10 DIAGNOSIS — R112 Nausea with vomiting, unspecified: Secondary | ICD-10-CM

## 2016-12-10 LAB — URINALYSIS, ROUTINE W REFLEX MICROSCOPIC
Bilirubin Urine: NEGATIVE
Glucose, UA: NEGATIVE mg/dL
Hgb urine dipstick: NEGATIVE
Ketones, ur: NEGATIVE mg/dL
LEUKOCYTES UA: NEGATIVE
Nitrite: NEGATIVE
PROTEIN: NEGATIVE mg/dL
Specific Gravity, Urine: 1.018 (ref 1.005–1.030)
pH: 5 (ref 5.0–8.0)

## 2016-12-10 LAB — COMPREHENSIVE METABOLIC PANEL
ALBUMIN: 3.9 g/dL (ref 3.5–5.0)
ALT: 51 U/L (ref 14–54)
ANION GAP: 8 (ref 5–15)
AST: 44 U/L — ABNORMAL HIGH (ref 15–41)
Alkaline Phosphatase: 45 U/L (ref 38–126)
BILIRUBIN TOTAL: 0.8 mg/dL (ref 0.3–1.2)
BUN: <5 mg/dL — ABNORMAL LOW (ref 6–20)
CO2: 27 mmol/L (ref 22–32)
Calcium: 8.8 mg/dL — ABNORMAL LOW (ref 8.9–10.3)
Chloride: 105 mmol/L (ref 101–111)
Creatinine, Ser: 0.74 mg/dL (ref 0.44–1.00)
Glucose, Bld: 105 mg/dL — ABNORMAL HIGH (ref 65–99)
POTASSIUM: 3.4 mmol/L — AB (ref 3.5–5.1)
Sodium: 140 mmol/L (ref 135–145)
TOTAL PROTEIN: 6.4 g/dL — AB (ref 6.5–8.1)

## 2016-12-10 LAB — LIPASE, BLOOD: Lipase: 27 U/L (ref 11–51)

## 2016-12-10 MED ORDER — IOPAMIDOL (ISOVUE-300) INJECTION 61%
INTRAVENOUS | Status: AC
  Administered 2016-12-10: 100 mL
  Filled 2016-12-10: qty 100

## 2016-12-10 MED ORDER — ACETAMINOPHEN 500 MG PO TABS: 1000.0000 mg | ORAL_TABLET | Freq: Four times a day (QID) | ORAL | 0 refills | Status: DC

## 2016-12-10 MED ORDER — IOPAMIDOL (ISOVUE-300) INJECTION 61%
INTRAVENOUS | Status: AC
  Filled 2016-12-10: qty 30

## 2016-12-10 MED ORDER — FENTANYL CITRATE (PF) 100 MCG/2ML IJ SOLN
50.0000 ug | Freq: Once | INTRAMUSCULAR | Status: AC
  Administered 2016-12-10: 50 ug via INTRAVENOUS
  Filled 2016-12-10: qty 2

## 2016-12-10 MED ORDER — SODIUM CHLORIDE 0.9 % IV BOLUS (SEPSIS)
1000.0000 mL | Freq: Once | INTRAVENOUS | Status: AC
  Administered 2016-12-10: 1000 mL via INTRAVENOUS

## 2016-12-10 MED ORDER — IBUPROFEN 600 MG PO TABS: 600.0000 mg | ORAL_TABLET | Freq: Three times a day (TID) | ORAL | 0 refills | Status: DC | PRN

## 2016-12-10 MED ORDER — PROMETHAZINE HCL 25 MG/ML IJ SOLN
25.0000 mg | Freq: Once | INTRAMUSCULAR | Status: AC
  Administered 2016-12-10: 25 mg via INTRAVENOUS
  Filled 2016-12-10: qty 1

## 2016-12-10 MED ORDER — FENTANYL CITRATE (PF) 100 MCG/2ML IJ SOLN
75.0000 ug | Freq: Once | INTRAMUSCULAR | Status: AC
  Administered 2016-12-10: 75 ug via INTRAVENOUS
  Filled 2016-12-10: qty 2

## 2016-12-10 MED ORDER — ONDANSETRON 4 MG PO TBDP: 4.0000 mg | ORAL_TABLET | Freq: Three times a day (TID) | ORAL | 0 refills | Status: DC | PRN

## 2016-12-10 MED ORDER — HYDROCODONE-ACETAMINOPHEN 5-325 MG PO TABS: 1.0000 | ORAL_TABLET | Freq: Four times a day (QID) | ORAL | 0 refills | Status: DC | PRN

## 2016-12-10 MED ORDER — ONDANSETRON HCL 4 MG/2ML IJ SOLN
4.0000 mg | Freq: Once | INTRAMUSCULAR | Status: AC
  Administered 2016-12-10: 4 mg via INTRAVENOUS
  Filled 2016-12-10: qty 2

## 2016-12-10 NOTE — ED Provider Notes (Signed)
[facility] DEPT Provider Note   CSN: [id number] Arrival date & time: 12/09/16  2300     History   Chief Complaint Chief Complaint  Patient presents with  . Abdominal Pain    HPI Felicia Davila is a 26 y.o. female presenting with acute worsening abdominal pain.  Patient states she had a cholecystectomy on Monday, 8/14 at [facility]. There are no consultations with the surgery, and she was discharged home. She has been doing well, until last night when she developed worsening abdominal pain. She states she has had 4 episodes of very sharp and severe epigastric abdominal pain. 2 of the episodes were associated with solid food intake, and the other 2 were associated with spray intake. She reports the pain is very sharp, and then fades on its own. Nothing makes it better or worse. She is not sure if her at-home pain medication was helping. She reports associated nausea and vomiting with these episodes, and has had chills throughout the night. She denies fevers, chest pain, shortness of breath, urinary symptoms, or abnormal bowel movements. She does not have any blood in her stool. She was eating well prior to these events last night. Besides recent surgery, she has no other risk factors for PE including nonsmoker, no mobilization, no pain or swelling of calves, no chest pain, no tachycardia, no estrogen use.  HPI  Past Medical History:  Diagnosis Date  . Anxiety   . Depression     There are no active problems to display for this patient.   Past Surgical History:  Procedure Laterality Date  . CESAREAN SECTION    . CHOLECYSTECTOMY    . FOREARM SURGERY      OB History    Gravida Para Term Preterm AB Living   1             SAB TAB Ectopic Multiple Live Births                   Home Medications    Prior to Admission medications   Medication Sig Start Date End Date Taking? Authorizing Provider  oxyCODONE-acetaminophen (PERCOCET) 5-325 MG tablet Take 1 tablet by mouth  every 4 (four) hours as needed. 12/05/16  Yes Larene Pickett, PA-C  promethazine (PHENERGAN) 25 MG tablet Take 1 tablet (25 mg total) by mouth every 6 (six) hours as needed for nausea or vomiting. 12/05/16  Yes Larene Pickett, PA-C  acetaminophen (TYLENOL) 500 MG tablet Take 2 tablets (1,000 mg total) by mouth every 6 (six) hours. 12/10/16   Suliman Termini, PA-C  HYDROcodone-acetaminophen (NORCO/VICODIN) 5-325 MG tablet Take 1 tablet by mouth every 6 (six) hours as needed for severe pain. 12/10/16   Andre Gallego, PA-C  ibuprofen (ADVIL,MOTRIN) 600 MG tablet Take 1 tablet (600 mg total) by mouth every 8 (eight) hours as needed for moderate pain. 12/10/16   Contrell Ballentine, PA-C  ondansetron (ZOFRAN-ODT) 4 MG disintegrating tablet Take 1 tablet (4 mg total) by mouth every 8 (eight) hours as needed for nausea or vomiting. 12/10/16   Janace Decker, PA-C    Family History No family history on file.  Social History Social History  Substance Use Topics  . Smoking status: Former Research scientist (life sciences)  . Smokeless tobacco: Never Used  . Alcohol use No     Allergies   Morphine and related and Hydrocodone   Review of Systems Review of Systems  Constitutional: Positive for chills. Negative for fever.  HENT: Negative for sore throat.   Eyes:  Negative for photophobia and visual disturbance.  Respiratory: Negative for cough and shortness of breath.   Cardiovascular: Negative for chest pain, palpitations and leg swelling.  Gastrointestinal: Positive for abdominal pain, nausea and vomiting. Negative for blood in stool, constipation and diarrhea.  Genitourinary: Negative for dysuria, flank pain, frequency and hematuria.  Musculoskeletal: Negative for arthralgias and neck pain.  Skin: Negative for wound.  Neurological: Negative for light-headedness and headaches.  Hematological: Does not bruise/bleed easily.  Psychiatric/Behavioral: Negative for confusion.     Physical Exam Updated Vital Signs BP  105/65 (BP Location: Left Arm)   Davila (!) 54   Temp 98.3 F (36.8 C) (Oral)   Resp 16   Ht 5\' 7"  (1.702 m)   Wt 67.1 kg (148 lb)   LMP 06/12/2016   SpO2 100%   BMI 23.18 kg/m   Physical Exam  Constitutional: She is oriented to person, place, and time. She appears well-developed and well-nourished. No distress.  HENT:  Head: Normocephalic and atraumatic.  Eyes: Pupils are equal, round, and reactive to light. EOM are normal.  Neck: Normal range of motion.  Cardiovascular: Normal rate, regular rhythm and intact distal pulses.   Pulmonary/Chest: Effort normal and breath sounds normal. No respiratory distress. She has no wheezes. She exhibits no tenderness.  Abdominal: Soft. Bowel sounds are normal. She exhibits no distension and no mass. There is tenderness in the epigastric area. There is no rigidity, no rebound, no guarding, no CVA tenderness, no tenderness at McBurney's point and negative Murphy's sign.  Tenderness of the epigastric and right upper quadrant abdomen. No obvious masses, bruising, or distention. Patient with 4 well healing laparoscopic incisions, without signs of cellulitis including drainage or erythema.  Musculoskeletal: Normal range of motion.  Lymphadenopathy:    She has no cervical adenopathy.  Neurological: She is alert and oriented to person, place, and time.  Skin: Skin is warm and dry. She is not diaphoretic.  Psychiatric: She has a normal mood and affect.  Nursing note and vitals reviewed.    ED Treatments / Results  Labs (all labs ordered are listed, but only abnormal results are displayed) Labs Reviewed  COMPREHENSIVE METABOLIC PANEL - Abnormal; Notable for the following:       Result Value   Potassium 3.4 (*)    Glucose, Bld 105 (*)    BUN <5 (*)    Calcium 8.8 (*)    Total Protein 6.4 (*)    AST 44 (*)    All other components within normal limits  CBC - Abnormal; Notable for the following:    Hemoglobin 11.6 (*)    HCT 35.2 (*)    All other  components within normal limits  URINALYSIS, ROUTINE W REFLEX MICROSCOPIC - Abnormal; Notable for the following:    APPearance HAZY (*)    All other components within normal limits  LIPASE, BLOOD  I-STAT BETA HCG BLOOD, ED ([facility], [facility], [facility] ONLY)    EKG  EKG Interpretation None       Radiology Ct Abdomen Pelvis W Contrast  Result Date: 12/10/2016 CLINICAL DATA:  Acute upper abdominal pain. EXAM: CT ABDOMEN AND PELVIS WITH CONTRAST TECHNIQUE: Multidetector CT imaging of the abdomen and pelvis was performed using the standard protocol following bolus administration of intravenous contrast. CONTRAST:  131mL ISOVUE-300 IOPAMIDOL (ISOVUE-300) INJECTION 61% COMPARISON:  CT scan of May 02, 2012. FINDINGS: Lower chest: No acute abnormality. Hepatobiliary: No focal liver abnormality is seen. Status post cholecystectomy. No biliary dilatation. Pancreas: Unremarkable. No  pancreatic ductal dilatation or surrounding inflammatory changes. Spleen: Normal in size without focal abnormality. Adrenals/Urinary Tract: Adrenal glands are unremarkable. Kidneys are normal, without renal calculi, focal lesion, or hydronephrosis. Bladder is unremarkable. Stomach/Bowel: Stomach is within normal limits. Appendix appears normal. No evidence of bowel wall thickening, distention, or inflammatory changes. Vascular/Lymphatic: No significant vascular findings are present. No enlarged abdominal or pelvic lymph nodes. Reproductive: Intrauterine device is noted. Other: Moderate amount of free fluid is seen in the pelvis and adnexal regions. Small amount of pneumoperitoneum is noted in the epigastric region as well as in the anterior subcutaneous tissues of the epigastric region consistent with recent laparoscopic surgery. Musculoskeletal: No acute or significant osseous findings. IMPRESSION: Findings are noted in the epigastric region consistent with the history of recent laparoscopic cholecystectomy. Moderate amount of free fluid is  also noted in the pelvis consistent with postoperative status. No abnormal fluid collection is seen in the gallbladder fossa. No other significant abnormality seen in the abdomen or pelvis. Electronically Signed   By: Marijo Conception, M.D.   On: 12/10/2016 12:15    Procedures Procedures (including critical care time)  Medications Ordered in ED Medications  ondansetron (ZOFRAN-ODT) 4 MG disintegrating tablet (not administered)  oxyCODONE-acetaminophen (PERCOCET/ROXICET) 5-325 MG per tablet (not administered)  ondansetron (ZOFRAN-ODT) disintegrating tablet 4 mg (4 mg Oral Given 12/09/16 2321)  oxyCODONE-acetaminophen (PERCOCET/ROXICET) 5-325 MG per tablet 1 tablet (1 tablet Oral Given 12/09/16 2321)  sodium chloride 0.9 % bolus 1,000 mL (0 mLs Intravenous Stopped 12/10/16 1216)  fentaNYL (SUBLIMAZE) injection 75 mcg (75 mcg Intravenous Given 12/10/16 0902)  ondansetron (ZOFRAN) injection 4 mg (4 mg Intravenous Given 12/10/16 0901)  promethazine (PHENERGAN) injection 25 mg (25 mg Intravenous Given 12/10/16 1014)  fentaNYL (SUBLIMAZE) injection 50 mcg (50 mcg Intravenous Given 12/10/16 1018)  iopamidol (ISOVUE-300) 61 % injection (100 mLs  Contrast Given 12/10/16 1143)     Initial Impression / Assessment and Plan / ED Course  I have reviewed the triage vital signs and the nursing notes.  Pertinent labs & imaging results that were available during my care of the patient were reviewed by me and considered in my medical decision making (see chart for details).     Patient presented with acute onset abdominal pain status post laparoscopic cholecystectomy on 8/14. Physical exam shows tenderness to palpation of epigastric and right upper quadrant area. No obvious infection around incision sites. Vital signs and reassuring as patient is afebrile, not tachycardic, and not hypotensive. Lab work reassuring, no leukocytosis, and mild anemia expected after surgery. Case discussed with attending, Dr. Leonette Monarch  evaluated the patient. Will order CT to ensure no infection, free fluid, free air, or other acute abdominal pathology.  Patient resting pain medicine and further assistance with nausea. Will give Phenergan and reorder fentanyl.  CT negative. At this time, doubt infectious etiology, perforation, obstx, or intraabd bleeding. Doubt PE or other pulm cause. Discussed findings with patient. Will ensure that she is able to tolerate by mouth intake prior to discharge. Will dc home with scheduled Tylenol and Norco for breakthrough pain. Zofran as needed. Patient to follow-up with surgeon. Discussed importance of diet for symptom control. At this time, patient appears safe for discharge. Return precautions given. Patient states she understands and agrees to plan.  Final Clinical Impressions(s) / ED Diagnoses   Final diagnoses:  Acute abdominal pain  Non-intractable vomiting with nausea, unspecified vomiting type    New Prescriptions Discharge Medication List as of 12/10/2016 12:41 PM  START taking these medications   Details  acetaminophen (TYLENOL) 500 MG tablet Take 2 tablets (1,000 mg total) by mouth every 6 (six) hours., Starting Fri 12/10/2016, Print    HYDROcodone-acetaminophen (NORCO/VICODIN) 5-325 MG tablet Take 1 tablet by mouth every 6 (six) hours as needed for severe pain., Starting Fri 12/10/2016, Print         Severn, Aziah Brostrom, PA-C 12/10/16 1755    Fatima Blank, MD 12/15/16 [phone]

## 2016-12-10 NOTE — ED Notes (Signed)
Patient transported to CT 

## 2016-12-10 NOTE — ED Notes (Signed)
Pt drinking oral contrast, c/o worsening abdominal pain, nausea. PA made aware, will order meds.

## 2016-12-10 NOTE — Discharge Instructions (Signed)
Use Zofran as needed for nausea or vomiting. He may take Norco as needed for severe pain. Continue taking Tylenol as scheduled to help prevent pain. Try to eat small meals of the day. Avoid fatty, spicy, or acidic foods. It is important that you follow-up with your surgeon for further evaluation of your pain and symptoms. Return to the emergency room if you develop fever, chills, persistent vomiting, swelling of your abdomen, or any new or worsening symptoms.

## 2016-12-10 NOTE — ED Notes (Signed)
PO challenge. Water given at this time.

## 2017-02-01 DIAGNOSIS — D447 Neoplasm of uncertain behavior of aortic body and other paraganglia: Secondary | ICD-10-CM | POA: Insufficient documentation

## 2018-06-25 ENCOUNTER — Ambulatory Visit (HOSPITAL_COMMUNITY)
Admission: EM | Admit: 2018-06-25 | Discharge: 2018-06-25 | Disposition: A | Discharge status: Home / Self Care | Payer: Self-pay | Attending: Family Medicine | Admitting: Family Medicine

## 2018-06-25 ENCOUNTER — Encounter (HOSPITAL_COMMUNITY): Payer: Self-pay | Admitting: Emergency Medicine

## 2018-06-25 DIAGNOSIS — M329 Systemic lupus erythematosus, unspecified: Secondary | ICD-10-CM

## 2018-06-25 HISTORY — DX: Reserved for concepts with insufficient information to code with codable children: IMO0002

## 2018-06-25 HISTORY — DX: Systemic lupus erythematosus, unspecified: M32.9

## 2018-06-25 MED ORDER — METHYLPREDNISOLONE 4 MG PO TBPK: ORAL_TABLET | ORAL | 0 refills | Status: DC

## 2018-06-25 NOTE — Discharge Instructions (Signed)
Take the medrol as directed Take all of day one today

## 2018-06-25 NOTE — ED Triage Notes (Addendum)
Pt states she has lupus and shes had joint pain x5 days, feels like a flair up, states her medicaid is out so she is in between doctors at the moment. Pt states shes been taking old prednisone 10mg  pills for the last few days which has helped.

## 2018-06-25 NOTE — ED Provider Notes (Signed)
[place]    CSN: [id number] Arrival date & time: 06/25/18  1334     History   Chief Complaint Chief Complaint  Patient presents with  . Joint Pain    HPI Felicia Davila is a 28 y.o. female.   HPI  Patient is here for body aches.  She feels very tired.  She feels that she is having a flare of her lupus.  She states that she ate green peppers for 5 days ago.  She thinks that this triggered her reaction.  Shortly after eating the pepper she developed a rash across her cheeks.  Then the body aches.  She is tried taking some over-the-counter anti-inflammatories.  She tried taking some old prednisone pills.  The prednisone helped somewhat.  She is currently not under the care of a rheumatologist on any suppressive medication  Past Medical History:  Diagnosis Date  . Anxiety   . Depression   . Lupus ([place])     There are no active problems to display for this patient.   Past Surgical History:  Procedure Laterality Date  . CESAREAN SECTION    . CHOLECYSTECTOMY    . FOREARM SURGERY      OB History    Gravida  1   Para      Term      Preterm      AB      Living        SAB      TAB      Ectopic      Multiple      Live Births               Home Medications    Prior to Admission medications   Medication Sig Start Date End Date Taking? Authorizing Provider  methylPREDNISolone (MEDROL DOSEPAK) 4 MG TBPK tablet tad 06/25/18   Raylene Everts, MD    Family History Family History  Problem Relation Age of Onset  . Lupus Father     Social History Social History   Tobacco Use  . Smoking status: Former Research scientist (life sciences)  . Smokeless tobacco: Never Used  Substance Use Topics  . Alcohol use: No  . Drug use: No     Allergies   Morphine and related; Hydrocodone; and Macrobid [nitrofurantoin]   Review of Systems Review of Systems  Constitutional: Positive for fatigue. Negative for chills and fever.  HENT: Negative for ear pain and sore  throat.   Eyes: Negative for pain and visual disturbance.  Respiratory: Negative for cough and shortness of breath.   Cardiovascular: Negative for chest pain and palpitations.  Gastrointestinal: Negative for abdominal pain and vomiting.  Genitourinary: Negative for dysuria and hematuria.  Musculoskeletal: Positive for arthralgias and myalgias. Negative for back pain.  Skin: Positive for rash. Negative for color change.  Neurological: Negative for seizures and syncope.  All other systems reviewed and are negative.    Physical Exam Triage Vital Signs ED Triage Vitals  Enc Vitals Group     BP 06/25/18 1355 119/79     Pulse Rate 06/25/18 1355 86     Resp 06/25/18 1355 16     Temp 06/25/18 1355 98.3 F (36.8 C)     Temp Source 06/25/18 1355 Oral     SpO2 06/25/18 1355 99 %     Weight --      Height --      Head Circumference --      Peak Flow --  Pain Score 06/25/18 1356 7     Pain Loc --      Pain Edu? --      Excl. in [place]? --    No data found.  Updated Vital Signs BP 119/79 (BP Location: Left Arm)   Pulse 86   Temp 98.3 F (36.8 C) (Oral)   Resp 16   SpO2 99%      Physical Exam Constitutional:      General: She is not in acute distress.    Appearance: She is well-developed.  HENT:     Head: Normocephalic and atraumatic.     Comments: Malar rash across cheeks    Right Ear: Tympanic membrane and ear canal normal.     Left Ear: Tympanic membrane and ear canal normal.     Nose: No rhinorrhea.  Eyes:     Conjunctiva/sclera: Conjunctivae normal.     Pupils: Pupils are equal, round, and reactive to light.  Neck:     Musculoskeletal: Normal range of motion.  Cardiovascular:     Rate and Rhythm: Normal rate and regular rhythm.     Heart sounds: Normal heart sounds.  Pulmonary:     Effort: Pulmonary effort is normal. No respiratory distress.     Breath sounds: Normal breath sounds.  Abdominal:     General: There is no distension.     Palpations: Abdomen is  soft.  Musculoskeletal: Normal range of motion.     Comments: No joint swelling or synovitis appreciated  Skin:    General: Skin is warm and dry.  Neurological:     Mental Status: She is alert.      UC Treatments / Results  Labs (all labs ordered are listed, but only abnormal results are displayed) Labs Reviewed - No data to display  EKG None  Radiology No results found.  Procedures Procedures (including critical care time)  Medications Ordered in UC Medications - No data to display  Initial Impression / Assessment and Plan / UC Course  I have reviewed the triage vital signs and the nursing notes.  Pertinent labs & imaging results that were available during my care of the patient were reviewed by me and considered in my medical decision making (see chart for details).     Patient needs a PCP.  She needs to be under the care of a rheumatologist.  I recommend she go to the [facility] health wellness clinic. Final Clinical Impressions(s) / UC Diagnoses   Final diagnoses:  Systemic lupus erythematosus, unspecified SLE type, unspecified organ involvement status [facility])     Discharge Instructions     Take the medrol as directed Take all of day one today   ED Prescriptions    Medication Sig Dispense Auth. Provider   methylPREDNISolone (MEDROL DOSEPAK) 4 MG TBPK tablet tad 21 tablet Raylene Everts, MD     Controlled Substance Prescriptions [place] Controlled Substance Registry consulted? Not Applicable   Raylene Everts, MD 06/25/18 1836

## 2018-12-13 ENCOUNTER — Emergency Department (HOSPITAL_COMMUNITY)
Admission: EM | Admit: 2018-12-13 | Discharge: 2018-12-13 | Disposition: A | Discharge status: Home / Self Care | Payer: Medicaid Other | Attending: Emergency Medicine | Admitting: Emergency Medicine

## 2018-12-13 ENCOUNTER — Encounter (HOSPITAL_COMMUNITY): Payer: Self-pay

## 2018-12-13 ENCOUNTER — Other Ambulatory Visit: Payer: Self-pay

## 2018-12-13 DIAGNOSIS — K029 Dental caries, unspecified: Secondary | ICD-10-CM

## 2018-12-13 DIAGNOSIS — Z87891 Personal history of nicotine dependence: Secondary | ICD-10-CM | POA: Insufficient documentation

## 2018-12-13 MED ORDER — PENICILLIN V POTASSIUM 250 MG PO TABS
500.0000 mg | ORAL_TABLET | Freq: Once | ORAL | Status: AC
Start: 2018-12-13 — End: 2018-12-13
  Administered 2018-12-13: 500 mg via ORAL
  Filled 2018-12-13: qty 2

## 2018-12-13 MED ORDER — OXYCODONE-ACETAMINOPHEN 5-325 MG PO TABS
1.0000 | ORAL_TABLET | Freq: Once | ORAL | Status: AC
  Administered 2018-12-13: 1 via ORAL
  Filled 2018-12-13: qty 1

## 2018-12-13 MED ORDER — NAPROXEN 500 MG PO TABS: 500.0000 mg | ORAL_TABLET | Freq: Two times a day (BID) | ORAL | 0 refills | Status: DC

## 2018-12-13 MED ORDER — PENICILLIN V POTASSIUM 500 MG PO TABS: 500.0000 mg | ORAL_TABLET | Freq: Four times a day (QID) | ORAL | 0 refills | Status: AC

## 2018-12-13 NOTE — ED Provider Notes (Signed)
[place] EMERGENCY DEPARTMENT Provider Note   CSN: [id number] Arrival date & time: 12/13/18  5366    History   Chief Complaint Chief Complaint  Patient presents with  . Dental Pain    HPI Felicia Davila is a 28 y.o. female presenting to the emergency department with 4 days of left upper molar dental pain.  The pain is radiating to her left ear. She states she is planning to be able to get an appointment next week, however her pain is difficult to control with ibuprofen and Goody's powder at home.  She denies fever or swelling,  Difficulty breathing or swallowing.     The history is provided by the patient.    Past Medical History:  Diagnosis Date  . Anxiety   . Depression   . Lupus ([place])     There are no active problems to display for this patient.   Past Surgical History:  Procedure Laterality Date  . CESAREAN SECTION    . CHOLECYSTECTOMY    . FOREARM SURGERY       OB History    Gravida  1   Para      Term      Preterm      AB      Living        SAB      TAB      Ectopic      Multiple      Live Births               Home Medications    Prior to Admission medications   Medication Sig Start Date End Date Taking? Authorizing Provider  methylPREDNISolone (MEDROL DOSEPAK) 4 MG TBPK tablet tad 06/25/18   Raylene Everts, MD  naproxen (NAPROSYN) 500 MG tablet Take 1 tablet (500 mg total) by mouth 2 (two) times daily. 12/13/18   Robinson, [place] N, PA-C  penicillin v potassium (VEETID) 500 MG tablet Take 1 tablet (500 mg total) by mouth 4 (four) times daily for 7 days. 12/14/18 12/21/18  Robinson, [place] N, PA-C    Family History Family History  Problem Relation Age of Onset  . Lupus Father     Social History Social History   Tobacco Use  . Smoking status: Former Research scientist (life sciences)  . Smokeless tobacco: Never Used  Substance Use Topics  . Alcohol use: No  . Drug use: No     Allergies   Morphine and related,  Hydrocodone, and Macrobid [nitrofurantoin]   Review of Systems Review of Systems  Constitutional: Negative for fever.  HENT: Positive for dental problem.      Physical Exam Updated Vital Signs BP 114/75   Pulse 72   Temp 98.5 F (36.9 C) (Oral)   Resp 16   SpO2 98%   Physical Exam Vitals signs and nursing note reviewed.  Constitutional:      General: She is not in acute distress.    Appearance: She is well-developed.  HENT:     Head: Normocephalic and atraumatic.     Left Ear: Tympanic membrane and ear canal normal.     Mouth/Throat:     Comments: Poor dentition throughout.  Left upper last molar with dental carry.  There is no gingival erythema or fluctuance.  Uvula is midline, tolerating secretions.  No facial swelling. Eyes:     Conjunctiva/sclera: Conjunctivae normal.  Pulmonary:     Effort: Pulmonary effort is normal.  Neurological:     Mental Status: She  is alert.  Psychiatric:        Mood and Affect: Mood normal.        Behavior: Behavior normal.      ED Treatments / Results  Labs (all labs ordered are listed, but only abnormal results are displayed) Labs Reviewed - No data to display  EKG None  Radiology No results found.  Procedures Procedures (including critical care time)  Medications Ordered in ED Medications  penicillin v potassium (VEETID) tablet 500 mg (has no administration in time range)  oxyCODONE-acetaminophen (PERCOCET/ROXICET) 5-325 MG per tablet 1 tablet (has no administration in time range)     Initial Impression / Assessment and Plan / ED Course  I have reviewed the triage vital signs and the nursing notes.  Pertinent labs & imaging results that were available during my care of the patient were reviewed by me and considered in my medical decision making (see chart for details).        Patient with dental caries.  No gross abscess.  VSS, afebrile, tolerating secretions. Exam unconcerning for peritonsillar abscess, Ludwig's  angina or spread of infection.  Will treat with penicillin and pain medicine.  Urged patient to follow-up with dentist. Pt safe for discharge.  Discussed results, findings, treatment and follow up. Patient advised of return precautions. Patient verbalized understanding and agreed with plan.   Final Clinical Impressions(s) / ED Diagnoses   Final diagnoses:  Pain due to dental caries    ED Discharge Orders         Ordered    penicillin v potassium (VEETID) 500 MG tablet  4 times daily     12/13/18 2013    naproxen (NAPROSYN) 500 MG tablet  2 times daily     12/13/18 2013           Robinson, [place] N, Hershal Coria 12/13/18 2015    Veryl Speak, MD 12/13/18 2053

## 2018-12-13 NOTE — ED Notes (Signed)
Patient verbalizes understanding of discharge instructions. Opportunity for questioning and answers were provided. Armband removed by staff, pt discharged from ED.  

## 2018-12-13 NOTE — Discharge Instructions (Addendum)
Please read instructions below. Take the antibiotic, Penicillin V, 4 times per day until they are gone. You can take Naproxen up to 2 times per day with meals, as needed for pain. Schedule an appointment with a dentist, using the dental resource guide attached. Return to the ER for difficulty swallowing or breathing, fever, or new or worsening symptoms.

## 2018-12-13 NOTE — ED Triage Notes (Signed)
Onset 1 week ago upper left back molar pain.  Tooth has needed a root canal for about a year but lost insurance.

## 2019-01-28 ENCOUNTER — Other Ambulatory Visit: Payer: Self-pay

## 2019-01-28 ENCOUNTER — Encounter (HOSPITAL_COMMUNITY): Payer: Self-pay | Admitting: Emergency Medicine

## 2019-01-28 ENCOUNTER — Emergency Department (HOSPITAL_COMMUNITY)
Admission: EM | Admit: 2019-01-28 | Discharge: 2019-01-28 | Disposition: A | Discharge status: Home / Self Care | Payer: Self-pay | Attending: Emergency Medicine | Admitting: Emergency Medicine

## 2019-01-28 DIAGNOSIS — K0889 Other specified disorders of teeth and supporting structures: Secondary | ICD-10-CM | POA: Insufficient documentation

## 2019-01-28 DIAGNOSIS — Z79899 Other long term (current) drug therapy: Secondary | ICD-10-CM | POA: Insufficient documentation

## 2019-01-28 DIAGNOSIS — Z87891 Personal history of nicotine dependence: Secondary | ICD-10-CM | POA: Insufficient documentation

## 2019-01-28 MED ORDER — CLINDAMYCIN HCL 150 MG PO CAPS
300.0000 mg | ORAL_CAPSULE | Freq: Once | ORAL | Status: AC
Start: 2019-01-28 — End: 2019-01-28
  Administered 2019-01-28: 300 mg via ORAL
  Filled 2019-01-28: qty 2

## 2019-01-28 MED ORDER — CLINDAMYCIN HCL 150 MG PO CAPS: 150.0000 mg | ORAL_CAPSULE | Freq: Three times a day (TID) | ORAL | 0 refills | Status: AC

## 2019-01-28 MED ORDER — OXYCODONE-ACETAMINOPHEN 5-325 MG PO TABS
1.0000 | ORAL_TABLET | Freq: Once | ORAL | Status: AC
  Administered 2019-01-28: 18:00:00 1 via ORAL
  Filled 2019-01-28: qty 1

## 2019-01-28 NOTE — ED Provider Notes (Signed)
[place] EMERGENCY DEPARTMENT Provider Note   CSN: [id number] Arrival date & time: 01/28/19  1603     History   Chief Complaint Chief Complaint  Patient presents with  . Dental Pain    HPI Felicia Davila is a 28 y.o. female who presents to the ED today complaining of gradual onset, constant, achy, left upper dental pain that began yesterday.  Patient reports she had a tooth pulled 3 days ago.  She states that her pain was controlled until last night.  She could not call them as they are closed over the weekend so she came to the ED instead.  She reports she was on penicillin prior to getting her tooth pulled although she states that the dentist told her after the tooth was pulled that there may still be infection there.  She was not prescribed antibiotics after her tooth was pulled.  Denies fever, chills, facial swelling, sore throat, difficulty swallowing, drainage from tooth, any other associated symptoms.  Patient states she has been taking ibuprofen and Tylenol as needed for the pain and applying ice without much relief.        Past Medical History:  Diagnosis Date  . Anxiety   . Depression   . Lupus ([place])     There are no active problems to display for this patient.   Past Surgical History:  Procedure Laterality Date  . CESAREAN SECTION    . CHOLECYSTECTOMY    . FOREARM SURGERY       OB History    Gravida  1   Para      Term      Preterm      AB      Living        SAB      TAB      Ectopic      Multiple      Live Births               Home Medications    Prior to Admission medications   Medication Sig Start Date End Date Taking? Authorizing Provider  clindamycin (CLEOCIN) 150 MG capsule Take 1 capsule (150 mg total) by mouth 3 (three) times daily for 7 days. 01/28/19 02/04/19  Eustaquio Maize, PA-C  methylPREDNISolone (MEDROL DOSEPAK) 4 MG TBPK tablet tad 06/25/18   Raylene Everts, MD  naproxen (NAPROSYN) 500 MG  tablet Take 1 tablet (500 mg total) by mouth 2 (two) times daily. 12/13/18   Robinson, [place] N, PA-C    Family History Family History  Problem Relation Age of Onset  . Lupus Father     Social History Social History   Tobacco Use  . Smoking status: Former Research scientist (life sciences)  . Smokeless tobacco: Never Used  Substance Use Topics  . Alcohol use: No  . Drug use: No     Allergies   Morphine and related, Hydrocodone, and Macrobid [nitrofurantoin]   Review of Systems Review of Systems  Constitutional: Negative for chills and fever.  HENT: Positive for dental problem. Negative for drooling, ear discharge, ear pain, facial swelling, sore throat and trouble swallowing.      Physical Exam Updated Vital Signs BP 110/71 (BP Location: Left Arm)   Pulse 83   Temp 98.3 F (36.8 C) (Oral)   Resp 12   Ht 5\' 6"  (1.676 m)   Wt 61.2 kg   SpO2 100%   BMI 21.79 kg/m   Physical Exam Vitals signs and nursing note reviewed.  Constitutional:      Appearance: She is not ill-appearing.  HENT:     Head: Normocephalic and atraumatic.     Right Ear: Tympanic membrane normal.     Left Ear: Tympanic membrane normal.     Mouth/Throat:     Comments: Nose clear.  L upper tooth #15 removed; no erythema or edema; no dental abscess appreciated;  Minimal gingival swelling around the area, no evidence of ludwig's.  Oropharynx clear and moist, without uvular swelling or deviation, no trismus or drooling, no tonsillar swelling or erythema, no exudates.    Eyes:     Conjunctiva/sclera: Conjunctivae normal.  Cardiovascular:     Rate and Rhythm: Normal rate and regular rhythm.  Pulmonary:     Effort: Pulmonary effort is normal.     Breath sounds: Normal breath sounds.  Skin:    General: Skin is warm and dry.     Coloration: Skin is not jaundiced.  Neurological:     Mental Status: She is alert.      ED Treatments / Results  Labs (all labs ordered are listed, but only abnormal results are  displayed) Labs Reviewed - No data to display  EKG None  Radiology No results found.  Procedures Procedures (including critical care time)  Medications Ordered in ED Medications  oxyCODONE-acetaminophen (PERCOCET/ROXICET) 5-325 MG per tablet 1 tablet (1 tablet Oral Given 01/28/19 1801)  clindamycin (CLEOCIN) capsule 300 mg (300 mg Oral Given 01/28/19 1801)     Initial Impression / Assessment and Plan / ED Course  I have reviewed the triage vital signs and the nursing notes.  Pertinent labs & imaging results that were available during my care of the patient were reviewed by me and considered in my medical decision making (see chart for details).    -year-old female who presents the ED today complaining of worsening dental pain after getting tooth extracted 3 days ago.  Unable to follow-up with dentist given they are closed for the weekend.  Is afebrile in the ED tachycardia or tachypnea.  Does have some mild gingival swelling around the extracted tooth but no signs of abscess today.  No Ludwigs angina.  She reports she took antibiotics prior to getting tooth extracted -penicillin.  When she is having worsening pain will prescribe clindamycin today to cover her until she can be seen by dentist again.  I have given 1 dose narcotic pain medication in the ED but have advised that I will not be writing for narcotics today.  Patient advised to continue taking ibuprofen and Tylenol as needed for the pain as well as applying ice to the area.   This note was prepared using Dragon voice recognition software and may include unintentional dictation errors due to the inherent limitations of voice recognition software.       Final Clinical Impressions(s) / ED Diagnoses   Final diagnoses:  Pain, dental    ED Discharge Orders         Ordered    clindamycin (CLEOCIN) 150 MG capsule  3 times daily     01/28/19 1820           Eustaquio Maize, PA-C 01/28/19 1820    Elnora Morrison,  MD 01/30/19 [phone]

## 2019-01-28 NOTE — Discharge Instructions (Signed)
Please take antibiotics as prescribed Follow up with your dentist tomorrow morning Continue taking Ibuprofen and Tylenol every 8 hours for pain

## 2019-01-28 NOTE — ED Triage Notes (Signed)
C/o left upper dental pain since yesterday.

## 2019-02-13 ENCOUNTER — Emergency Department (HOSPITAL_COMMUNITY)
Admission: EM | Admit: 2019-02-13 | Discharge: 2019-02-13 | Disposition: A | Discharge status: Home / Self Care | Payer: Medicaid Other | Attending: Emergency Medicine | Admitting: Emergency Medicine

## 2019-02-13 ENCOUNTER — Other Ambulatory Visit: Payer: Self-pay

## 2019-02-13 ENCOUNTER — Encounter (HOSPITAL_COMMUNITY): Payer: Self-pay

## 2019-02-13 DIAGNOSIS — J988 Other specified respiratory disorders: Secondary | ICD-10-CM

## 2019-02-13 DIAGNOSIS — Z87891 Personal history of nicotine dependence: Secondary | ICD-10-CM | POA: Insufficient documentation

## 2019-02-13 DIAGNOSIS — Z20828 Contact with and (suspected) exposure to other viral communicable diseases: Secondary | ICD-10-CM | POA: Insufficient documentation

## 2019-02-13 DIAGNOSIS — J22 Unspecified acute lower respiratory infection: Secondary | ICD-10-CM | POA: Insufficient documentation

## 2019-02-13 DIAGNOSIS — M3210 Systemic lupus erythematosus, organ or system involvement unspecified: Secondary | ICD-10-CM | POA: Insufficient documentation

## 2019-02-13 NOTE — ED Triage Notes (Addendum)
Patient complains of fever with cough and congestion x 2 days. Denies known covid exposure. Alert and oriented, NAD. Complains of chest tightness with cough and inspiration

## 2019-02-13 NOTE — ED Notes (Signed)
Patient verbalizes understanding of discharge instructions. Opportunity for questioning and answers were provided. Armband removed by staff, pt discharged from ED.  

## 2019-02-13 NOTE — ED Provider Notes (Signed)
[facility] EMERGENCY DEPARTMENT Provider Note   CSN: [id number] Arrival date & time: 02/13/19  [id number]     History   Chief Complaint Chief Complaint  Patient presents with  . cough/ congestion    HPI Felicia Davila is a 28 y.o. female.     HPI   28 year old female presents today with complaints of respiratory infection.  Patient notes that approximate 4 days ago she developed tightness and pain in her chest.  This was followed by cough and minor shortness of breath.  She notes the cough is dry nonproductive and minor sore throat.  She notes that shortness of breath is worse with ambulation.  She notes no immediate exposure to any Covid positive patients but notes that her grandmother has Covid and she has been spending time around people that have been with her.  She notes she vapes and does not smoke cigarettes, she denies any cardiac history, no history DVT or PE, no lower extremity swelling or edema.  She notes a fever up to 100.2 last night, she took NyQuil last night and DayQuil this morning.  Past Medical History:  Diagnosis Date  . Anxiety   . Depression   . Lupus ([place])     There are no active problems to display for this patient.   Past Surgical History:  Procedure Laterality Date  . CESAREAN SECTION    . CHOLECYSTECTOMY    . FOREARM SURGERY       OB History    Gravida  1   Para      Term      Preterm      AB      Living        SAB      TAB      Ectopic      Multiple      Live Births               Home Medications    Prior to Admission medications   Medication Sig Start Date End Date Taking? Authorizing Provider  methylPREDNISolone (MEDROL DOSEPAK) 4 MG TBPK tablet tad 06/25/18   Raylene Everts, MD  naproxen (NAPROSYN) 500 MG tablet Take 1 tablet (500 mg total) by mouth 2 (two) times daily. 12/13/18   Robinson, [place] N, PA-C    Family History Family History  Problem Relation Age of Onset  . Lupus Father      Social History Social History   Tobacco Use  . Smoking status: Former Research scientist (life sciences)  . Smokeless tobacco: Never Used  Substance Use Topics  . Alcohol use: No  . Drug use: No     Allergies   Morphine and related, Hydrocodone, and Macrobid [nitrofurantoin]   Review of Systems Review of Systems  All other systems reviewed and are negative.    Physical Exam Updated Vital Signs BP 119/70 (BP Location: Right Arm)   Pulse 68   Temp 98.5 F (36.9 C) (Oral)   Resp 14   SpO2 100%   Physical Exam Vitals signs and nursing note reviewed.  Constitutional:      Appearance: She is well-developed.  HENT:     Head: Normocephalic and atraumatic.  Eyes:     General: No scleral icterus.       Right eye: No discharge.        Left eye: No discharge.     Conjunctiva/sclera: Conjunctivae normal.     Pupils: Pupils are equal, round, and reactive to light.  Neck:     Musculoskeletal: Normal range of motion.     Vascular: No JVD.     Trachea: No tracheal deviation.  Cardiovascular:     Rate and Rhythm: Normal rate and regular rhythm.  Pulmonary:     Effort: Pulmonary effort is normal. No respiratory distress.     Breath sounds: Normal breath sounds. No stridor. No wheezing, rhonchi or rales.  Musculoskeletal:     Comments: No edema to the lower extremities-nontender  Neurological:     Mental Status: She is alert and oriented to person, place, and time.     Coordination: Coordination normal.  Psychiatric:        Behavior: Behavior normal.        Thought Content: Thought content normal.        Judgment: Judgment normal.      ED Treatments / Results  Labs (all labs ordered are listed, but only abnormal results are displayed) Labs Reviewed - No data to display  EKG None  Radiology No results found.  Procedures Procedures (including critical care time)  Medications Ordered in ED Medications - No data to display   Initial Impression / Assessment and Plan / ED Course  I  have reviewed the triage vital signs and the nursing notes.  Pertinent labs & imaging results that were available during my care of the patient were reviewed by me and considered in my medical decision making (see chart for details).        28 year old female presents today with likely viral illness.  She is very well-appearing no acute distress.  She is afebrile nontoxic.  Clear lung sounds no respiratory distress.  No signs of cardiac or significant pulmonary etiology including pulmonary embolism.  Discharged with symptomatic care and strict return precautions.  Verbalized understanding and agreement to today's plan.  Felicia Davila was evaluated in Emergency Department on 02/13/2019 for the symptoms described in the history of present illness. She was evaluated in the context of the global COVID-19 pandemic, which necessitated consideration that the patient might be at risk for infection with the SARS-CoV-2 virus that causes COVID-19. Institutional protocols and algorithms that pertain to the evaluation of patients at risk for COVID-19 are in a state of rapid change based on information released by regulatory bodies including the CDC and federal and state organizations. These policies and algorithms were followed during the patient's care in the ED.  Final Clinical Impressions(s) / ED Diagnoses   Final diagnoses:  Respiratory infection    ED Discharge Orders    None       Francee Gentile 02/13/19 [id number]    Hayden Rasmussen, MD 02/13/19 [phone]

## 2019-02-13 NOTE — Discharge Instructions (Signed)
Please read attached information. If you experience any new or worsening signs or symptoms please return to the emergency room for evaluation. Please follow-up with your primary care provider or specialist as discussed.  °

## 2019-02-14 LAB — NOVEL CORONAVIRUS, NAA (HOSP ORDER, SEND-OUT TO REF LAB; TAT 18-24 HRS): SARS-CoV-2, NAA: NOT DETECTED

## 2019-02-15 ENCOUNTER — Emergency Department (HOSPITAL_COMMUNITY)
Admission: EM | Admit: 2019-02-15 | Discharge: 2019-02-16 | Disposition: A | Discharge status: Home / Self Care | Payer: Medicaid Other | Attending: Emergency Medicine | Admitting: Emergency Medicine

## 2019-02-15 ENCOUNTER — Emergency Department (HOSPITAL_COMMUNITY): Payer: Medicaid Other

## 2019-02-15 ENCOUNTER — Encounter (HOSPITAL_COMMUNITY): Payer: Self-pay | Admitting: Emergency Medicine

## 2019-02-15 ENCOUNTER — Other Ambulatory Visit: Payer: Self-pay

## 2019-02-15 DIAGNOSIS — Z5321 Procedure and treatment not carried out due to patient leaving prior to being seen by health care provider: Secondary | ICD-10-CM | POA: Insufficient documentation

## 2019-02-15 DIAGNOSIS — R0602 Shortness of breath: Secondary | ICD-10-CM | POA: Insufficient documentation

## 2019-02-15 DIAGNOSIS — R079 Chest pain, unspecified: Secondary | ICD-10-CM | POA: Insufficient documentation

## 2019-02-15 LAB — BASIC METABOLIC PANEL
Anion gap: 11 (ref 5–15)
BUN: 5 mg/dL — ABNORMAL LOW (ref 6–20)
CO2: 23 mmol/L (ref 22–32)
Calcium: 9.1 mg/dL (ref 8.9–10.3)
Chloride: 108 mmol/L (ref 98–111)
Creatinine, Ser: 0.75 mg/dL (ref 0.44–1.00)
GFR calc Af Amer: >60 mL/min (ref >60)
GFR calc non Af Amer: >60 mL/min (ref >60)
Glucose, Bld: 66 mg/dL — ABNORMAL LOW (ref 70–99)
Potassium: 3.2 mmol/L — ABNORMAL LOW (ref 3.5–5.1)
Sodium: 142 mmol/L (ref 135–145)

## 2019-02-15 LAB — CBC
HCT: 39.6 % (ref 36.0–46.0)
Hemoglobin: 12.5 g/dL (ref 12.0–15.0)
MCH: 30.3 pg (ref 26.0–34.0)
MCHC: 31.6 g/dL (ref 30.0–36.0)
MCV: 95.9 fL (ref 80.0–100.0)
Platelets: 239 10*3/uL (ref 150–400)
RBC: 4.13 MIL/uL (ref 3.87–5.11)
RDW: 11.8 % (ref 11.5–15.5)
WBC: 4.6 10*3/uL (ref 4.0–10.5)
nRBC: 0 % (ref 0.0–0.2)

## 2019-02-15 LAB — TROPONIN I (HIGH SENSITIVITY): Troponin I (High Sensitivity): <2 ng/L (ref <18)

## 2019-02-15 LAB — I-STAT BETA HCG BLOOD, ED (MC, WL, AP ONLY): I-stat hCG, quantitative: <5 m[IU]/mL (ref <5)

## 2019-02-15 MED ORDER — SODIUM CHLORIDE 0.9% FLUSH: 3.0000 mL | Freq: Once | INTRAVENOUS | Status: DC

## 2019-02-15 NOTE — ED Triage Notes (Signed)
Pt in with c/o chest pain/pressure x 3 days. Endorses cough, came to ED 3 days ago and tested negative for Covid. States pain is worse today - worse w/mvmt and deep breaths

## 2019-02-16 NOTE — ED Notes (Addendum)
Pt called for repeat blood work, no answer

## 2019-03-01 ENCOUNTER — Other Ambulatory Visit: Payer: Self-pay

## 2019-03-01 ENCOUNTER — Encounter (HOSPITAL_COMMUNITY): Payer: Self-pay | Admitting: Emergency Medicine

## 2019-03-01 ENCOUNTER — Emergency Department (HOSPITAL_COMMUNITY)
Admission: EM | Admit: 2019-03-01 | Discharge: 2019-03-01 | Disposition: A | Discharge status: Home / Self Care | Payer: Medicaid Other | Attending: Emergency Medicine | Admitting: Emergency Medicine

## 2019-03-01 DIAGNOSIS — Z791 Long term (current) use of non-steroidal anti-inflammatories (NSAID): Secondary | ICD-10-CM | POA: Insufficient documentation

## 2019-03-01 DIAGNOSIS — Y9389 Activity, other specified: Secondary | ICD-10-CM | POA: Insufficient documentation

## 2019-03-01 DIAGNOSIS — M25512 Pain in left shoulder: Secondary | ICD-10-CM

## 2019-03-01 DIAGNOSIS — Y990 Civilian activity done for income or pay: Secondary | ICD-10-CM | POA: Insufficient documentation

## 2019-03-01 DIAGNOSIS — X500XXA Overexertion from strenuous movement or load, initial encounter: Secondary | ICD-10-CM | POA: Insufficient documentation

## 2019-03-01 DIAGNOSIS — Y9259 Other trade areas as the place of occurrence of the external cause: Secondary | ICD-10-CM | POA: Insufficient documentation

## 2019-03-01 DIAGNOSIS — Z87891 Personal history of nicotine dependence: Secondary | ICD-10-CM | POA: Insufficient documentation

## 2019-03-01 MED ORDER — KETOROLAC TROMETHAMINE 60 MG/2ML IM SOLN
30.0000 mg | Freq: Once | INTRAMUSCULAR | Status: AC
  Administered 2019-03-01: 11:00:00 30 mg via INTRAMUSCULAR
  Filled 2019-03-01: qty 2

## 2019-03-01 MED ORDER — MELOXICAM 15 MG PO TBDP: 15.0000 mg | ORAL_TABLET | Freq: Every day | ORAL | 0 refills | Status: DC | PRN

## 2019-03-01 MED ORDER — CYCLOBENZAPRINE HCL 10 MG PO TABS: 10.0000 mg | ORAL_TABLET | Freq: Two times a day (BID) | ORAL | 0 refills | Status: DC | PRN

## 2019-03-01 NOTE — ED Notes (Signed)
Patient verbalizes understanding of discharge instructions. Opportunity for questioning and answers were provided. Armband removed by staff, pt discharged from ED.  

## 2019-03-01 NOTE — ED Triage Notes (Signed)
Pt states she went to an ED on Monday and was told she tore her rotator cuff. Pt states she continues to have pain. Pt's left arm in a sling. Requesting pain medication.

## 2019-03-01 NOTE — ED Provider Notes (Signed)
[facility] DEPART[facility] Provider Note   CSN: [id number] Arrival date & time: 03/01/19  [id number]     History   Chief Complaint Chief Complaint  Patient presents with  . Shoulder Pain    HPI Felicia Davila is a 28 y.o. female with a hx of anxiety, depression, & lupus who presents to the ED with complaints of continued L shoulder pain & request for work note. Patient states that several days prior she was doing overhead lifting of heavy furniture at work when she felt a pop in her L shoulder. She has been having mild soreness prior to this but with pop developed more significant pain which has been constant, worse with movement, no alleviating factors. Went to [facility] had xrays which were negative, there was concern for rotator cuff injury, given prescriptions for naproxen/robaxin/lidoderm which she has tried without much relief. Her insurance company is trying to coordinate ortho referral.  Denies fever, chills, numbness, tingling, weakness, or chance of pregnancy.  Denies Reinjury.    HPI  Past Medical History:  Diagnosis Date  . Anxiety   . Depression   . Lupus ([place])     There are no active problems to display for this patient.   Past Surgical History:  Procedure Laterality Date  . CESAREAN SECTION    . CHOLECYSTECTOMY    . FOREARM SURGERY       OB History    Gravida  1   Para      Term      Preterm      AB      Living        SAB      TAB      Ectopic      Multiple      Live Births               Home Medications    Prior to Admission medications   Medication Sig Start Date End Date Taking? Authorizing Provider  methylPREDNISolone (MEDROL DOSEPAK) 4 MG TBPK tablet tad 06/25/18   Raylene Everts, MD  naproxen (NAPROSYN) 500 MG tablet Take 1 tablet (500 mg total) by mouth 2 (two) times daily. 12/13/18   Robinson, [place] N, PA-C    Family History Family History  Problem Relation Age of Onset  . Lupus Father     Social History Social History   Tobacco Use  . Smoking status: Former Research scientist (life sciences)  . Smokeless tobacco: Never Used  Substance Use Topics  . Alcohol use: No  . Drug use: No     Allergies   Morphine and related, Hydrocodone, and Macrobid [nitrofurantoin]   Review of Systems Review of Systems  Constitutional: Negative for chills and fever.  Respiratory: Negative for shortness of breath.   Cardiovascular: Negative for chest pain.  Musculoskeletal: Positive for arthralgias.  Skin: Negative for color change, rash and wound.  Neurological: Negative for weakness and numbness.     Physical Exam Updated Vital Signs BP 112/76 (BP Location: Right Arm)   Pulse 73   Temp 98.3 F (36.8 C) (Oral)   Resp 14   SpO2 100%   Physical Exam Vitals signs and nursing note reviewed.  Constitutional:      General: She is not in acute distress.    Appearance: Normal appearance. She is not ill-appearing or toxic-appearing.  HENT:     Head: Normocephalic and atraumatic.  Neck:     Musculoskeletal: Normal range of motion and neck supple.  Comments: No midline tenderness.  Cardiovascular:     Rate and Rhythm: Normal rate.     Pulses:          Radial pulses are 2+ on the right side and 2+ on the left side.  Pulmonary:     Effort: No respiratory distress.     Breath sounds: Normal breath sounds.  Musculoskeletal:     Comments: Upper extremities: Patient initially in shoulder sling. No obvious deformity, appreciable swelling, edema, erythema, ecchymosis, warmth, or open wounds. Patient has intact AROM throughout with the exception of L shoulder- able to flex/abduct to about 90 degrees- difficult past this actively. Tender to palpation over the L shoulder SITS muscles & the trapezius. No point/focal bony tenderness.  She has pain with empty can test with the left upper extremity but does have good strength.  Skin:    General: Skin is warm and dry.     Capillary Refill: Capillary refill takes less  than 2 seconds.  Neurological:     Mental Status: She is alert.     Comments: Alert. Clear speech. Sensation grossly intact to bilateral upper extremities. 5/5 symmetric grip strength. Ambulatory.   Psychiatric:        Mood and Affect: Mood normal.        Behavior: Behavior normal.    ED Treatments / Results  Labs (all labs ordered are listed, but only abnormal results are displayed) Labs Reviewed - No data to display  EKG None  Radiology No results found.  Procedures Procedures (including critical care time)  Medications Ordered in ED Medications  ketorolac (TORADOL) injection 30 mg (has no administration in time range)     Initial Impression / Assessment and Plan / ED Course  I have reviewed the triage vital signs and the nursing notes.  Pertinent labs & imaging results that were available during my care of the patient were reviewed by me and considered in my medical decision making (see chart for details).   Patient presents to the emergency department with continued left shoulder pain and request for work note after injury several days prior.  She was seen at an alternative ER a few days prior with negative x-rays-no fracture or dislocation.  She is nontoxic-appearing, no apparent distress, vitals WNL.  No fever, erythema, warmth, or redness to indicate septic joint.  She is able to range the joint somewhat, not consistent with frozen shoulder.  No point/focal bony tenderness, given recent negative x-ray do not feel this needs repeating.  Potential rotator cuff injury.  Given naproxen/Robaxin as not giving her much relief will trial meloxicam/Flexeril, discussed with patient not to drive or operate heavy machinery when taking this medicine.  We will also provide IM Toradol in the emergency department.  Orthopedic surgery information for follow-up provided.  Additional work note provided as well. I discussed  treatment plan, need for follow-up, and return precautions with the  patient. Provided opportunity for questions, patient confirmed understanding and is in agreement with plan.    Final Clinical Impressions(s) / ED Diagnoses   Final diagnoses:  Acute pain of left shoulder    ED Discharge Orders         Ordered    Meloxicam 15 MG TBDP  Daily PRN     03/01/19 1053    cyclobenzaprine (FLEXERIL) 10 MG tablet  2 times daily PRN     03/01/19 1053           Coalton Arch R, PA-C 03/01/19 1058  Carmin Muskrat, MD 03/01/19 1121

## 2019-03-01 NOTE — Discharge Instructions (Signed)
You were seen in the emergency department today for shoulder pain.  It is possible that this pain is related to a rotator cuff injury, please see attached exercises to help strengthen this area.  We are sending you home with the following medicines:  - Meloxicam is a nonsteroidal anti-inflammatory medication that will help with pain and swelling. Be sure to take this medication as prescribed with food, 1 pill every 12 hours,  It should be taken with food, as it can cause stomach upset, and more seriously, stomach bleeding. Do not take other nonsteroidal anti-inflammatory medications with this such as Advil, Motrin, Aleve, Mobic, naproxen, Goodie Powder, or Motrin.    - Flexeril is the muscle relaxer I have prescribed, this is meant to help with muscle tightness. Be aware that this medication may make you drowsy therefore the first time you take this it should be at a time you are in an environment where you can rest. Do not drive or operate heavy machinery when taking this medication. Do not drink alcohol or take other sedating medications with this medicine such as narcotics or benzodiazepines.   You make take Tylenol per over the counter dosing with these medications.   We have prescribed you new medication(s) today. Discuss the medications prescribed today with your pharmacist as they can have adverse effects and interactions with your other medicines including over the counter and prescribed medications. Seek medical evaluation if you start to experience new or abnormal symptoms after taking one of these medicines, seek care immediately if you start to experience difficulty breathing, feeling of your throat closing, facial swelling, or rash as these could be indications of a more serious allergic reaction    We would like you to follow-up with orthopedics within 3 days.  Return to the ER for new or worsening symptoms or any other concerns.

## 2019-06-30 ENCOUNTER — Ambulatory Visit
Admission: EM | Admit: 2019-06-30 | Discharge: 2019-06-30 | Disposition: A | Discharge status: Home / Self Care | Payer: BC Managed Care – PPO | Attending: Physician Assistant | Admitting: Physician Assistant

## 2019-06-30 ENCOUNTER — Other Ambulatory Visit: Payer: Self-pay

## 2019-06-30 ENCOUNTER — Ambulatory Visit: Payer: BC Managed Care – PPO

## 2019-06-30 ENCOUNTER — Ambulatory Visit (INDEPENDENT_AMBULATORY_CARE_PROVIDER_SITE_OTHER): Payer: BC Managed Care – PPO

## 2019-06-30 DIAGNOSIS — M79632 Pain in left forearm: Secondary | ICD-10-CM

## 2019-06-30 DIAGNOSIS — M79602 Pain in left arm: Secondary | ICD-10-CM | POA: Diagnosis not present

## 2019-06-30 MED ORDER — PREDNISONE 50 MG PO TABS: 50.0000 mg | ORAL_TABLET | Freq: Every day | ORAL | 0 refills | Status: DC

## 2019-06-30 MED ORDER — KETOROLAC TROMETHAMINE 30 MG/ML IJ SOLN
15.0000 mg | Freq: Once | INTRAMUSCULAR | Status: AC
  Administered 2019-06-30: 15 mg via INTRAMUSCULAR

## 2019-06-30 NOTE — Discharge Instructions (Signed)
X-ray negative for new injury.  Toradol injection in office today.  Start prednisone as directed.  Ice compress, rest.  Follow-up with orthopedics for further evaluation if symptoms not improving.

## 2019-06-30 NOTE — ED Provider Notes (Signed)
[facility]    CSN: [id number] Arrival date & time: 06/30/19  [id number]      History   Chief Complaint Chief Complaint  Patient presents with  . Arm Pain    HPI Felicia Davila is a 29 y.o. female.   29 year old female comes in for left forearm pain after injury 2 days ago. States had fracture to the proximal ulna requiring surgical repair as a child approx 15 years ago. At baseline, able to feel the 2 screws s/p surgery. 2 days ago, swung her hand and hit the area on a table. Has had swelling to the screw head since with increased pain. When injury first happened, felt numbness/tingling to the 4th and 5th finger, this has resolved. Denies contusion. States wrist ROM has been causing more pain. Ibuprofen/tylenol without relief.      Past Medical History:  Diagnosis Date  . Anxiety   . Depression   . Lupus ([place])     There are no problems to display for this patient.   Past Surgical History:  Procedure Laterality Date  . CESAREAN SECTION    . CHOLECYSTECTOMY    . FOREARM SURGERY      OB History    Gravida  1   Para      Term      Preterm      AB      Living        SAB      TAB      Ectopic      Multiple      Live Births               Home Medications    Prior to Admission medications   Medication Sig Start Date End Date Taking? Authorizing Provider  predniSONE (DELTASONE) 50 MG tablet Take 1 tablet (50 mg total) by mouth daily with breakfast. 06/30/19   Ok Edwards, PA-C    Family History Family History  Problem Relation Age of Onset  . Lupus Father     Social History Social History   Tobacco Use  . Smoking status: Former Research scientist (life sciences)  . Smokeless tobacco: Never Used  Substance Use Topics  . Alcohol use: No  . Drug use: No     Allergies   Morphine and related, Hydrocodone, and Macrobid [nitrofurantoin]   Review of Systems Review of Systems  Reason unable to perform ROS: See HPI as above.     Physical Exam Triage  Vital Signs ED Triage Vitals  Enc Vitals Group     BP 06/30/19 0849 117/85     Pulse Rate 06/30/19 0849 93     Resp 06/30/19 0849 15     Temp 06/30/19 0849 98.2 F (36.8 C)     Temp Source 06/30/19 0849 Oral     SpO2 06/30/19 0849 99 %     Weight --      Height --      Head Circumference --      Peak Flow --      Pain Score 06/30/19 0850 7     Pain Loc --      Pain Edu? --      Excl. in [place]? --    No data found.  Updated Vital Signs BP 117/85 (BP Location: Right Arm)   Pulse 93   Temp 98.2 F (36.8 C) (Oral)   Resp 15   SpO2 99%   Physical Exam Constitutional:  General: She is not in acute distress.    Appearance: Normal appearance. She is well-developed. She is not toxic-appearing or diaphoretic.  HENT:     Head: Normocephalic and atraumatic.  Eyes:     Conjunctiva/sclera: Conjunctivae normal.     Pupils: Pupils are equal, round, and reactive to light.  Pulmonary:     Effort: Pulmonary effort is normal. No respiratory distress.     Comments: Speaking in full sentences without difficulty Musculoskeletal:     Cervical back: Normal range of motion and neck supple.     Comments: Left arm with localized swelling around proximal ulna where hardware is also felt. No contusion, erythema, warmth. No tenderness to palpation of the elbow, radius, hand. Tenderness to palpation along proximal ulna. Second screw felt distal to the first, no tenderness. Full ROM of the elbow. Full ROM of wrist, though pain with supination/pronation/flexion. Grip strength 5/5 bilaterally. Sensation intact and equal bilaterally. Radial pulse 2+  Skin:    General: Skin is warm and dry.  Neurological:     Mental Status: She is alert and oriented to person, place, and time.      UC Treatments / Results  Labs (all labs ordered are listed, but only abnormal results are displayed) Labs Reviewed - No data to display  EKG   Radiology DG Forearm Left  Result Date: 06/30/2019 CLINICAL DATA:   29 year old with prior ORIF of proximal radius and ulnar fractures, presenting with LATERAL LEFT forearm pain after striking her forearm at work 2 days ago. Initial encounter. EXAM: LEFT FOREARM - 2 VIEW COMPARISON:  None. FINDINGS: No evidence of acute fracture involving the radius or ulna. Prior ORIF of proximal radius and ulna fractures with plate and screw fixation, normal healing, without complicating features. Visualized elbow joint and wrist joint anatomically aligned without degenerative changes. IMPRESSION: No acute osseous abnormality. Prior ORIF of proximal radius and ulna fractures with normal healing and no complicating features. Electronically Signed   By: Evangeline Dakin M.D.   On: 06/30/2019 09:54    Procedures Procedures (including critical care time)  Medications Ordered in UC Medications  ketorolac (TORADOL) 30 MG/ML injection 15 mg (15 mg Intramuscular Given 06/30/19 1032)    Initial Impression / Assessment and Plan / UC Course  I have reviewed the triage vital signs and the nursing notes.  Pertinent labs & imaging results that were available during my care of the patient were reviewed by me and considered in my medical decision making (see chart for details).    X-ray negative for complicating features on prior ORIF.  Will provide symptomatic treatment at this time.  Sugar tong Sam-splint applied to provide protection to that area.  Toradol injection in office today for symptomatic treatment per patient request.  Prednisone as directed.  Return precautions given.  Final Clinical Impressions(s) / UC Diagnoses   Final diagnoses:  Left arm pain    ED Prescriptions    Medication Sig Dispense Auth. Provider   predniSONE (DELTASONE) 50 MG tablet Take 1 tablet (50 mg total) by mouth daily with breakfast. 5 tablet Ok Edwards, PA-C     I have reviewed the PDMP during this encounter.   Ok Edwards, PA-C 06/30/19 1246

## 2019-06-30 NOTE — ED Triage Notes (Signed)
Patient her for evaluation of left forearm pain that started 2 nights ago when she bumped her arm while at work. She has h/o orthopedic surgery 15 years ago to the affected arm.  Home intervention without relief.

## 2020-02-14 ENCOUNTER — Other Ambulatory Visit: Payer: Self-pay | Admitting: Family Medicine

## 2020-02-14 ENCOUNTER — Ambulatory Visit: Payer: Self-pay

## 2020-02-14 ENCOUNTER — Other Ambulatory Visit: Payer: Self-pay

## 2020-02-14 DIAGNOSIS — M79644 Pain in right finger(s): Secondary | ICD-10-CM

## 2020-05-29 ENCOUNTER — Other Ambulatory Visit: Payer: Self-pay | Admitting: Family Medicine

## 2020-05-29 DIAGNOSIS — M25512 Pain in left shoulder: Secondary | ICD-10-CM

## 2020-06-17 ENCOUNTER — Ambulatory Visit
Admission: RE | Admit: 2020-06-17 | Discharge: 2020-06-17 | Disposition: A | Discharge status: Home / Self Care | Payer: Medicaid Other | Source: Ambulatory Visit | Attending: Family Medicine | Admitting: Family Medicine

## 2020-06-17 ENCOUNTER — Other Ambulatory Visit: Payer: Self-pay

## 2020-06-17 DIAGNOSIS — M25512 Pain in left shoulder: Secondary | ICD-10-CM

## 2021-01-22 ENCOUNTER — Ambulatory Visit
Admission: EM | Admit: 2021-01-22 | Discharge: 2021-01-22 | Disposition: A | Discharge status: Home / Self Care | Payer: Medicaid Other | Attending: Internal Medicine | Admitting: Internal Medicine

## 2021-01-22 ENCOUNTER — Other Ambulatory Visit: Payer: Self-pay

## 2021-01-22 DIAGNOSIS — H65193 Other acute nonsuppurative otitis media, bilateral: Secondary | ICD-10-CM

## 2021-01-22 DIAGNOSIS — J069 Acute upper respiratory infection, unspecified: Secondary | ICD-10-CM

## 2021-01-22 DIAGNOSIS — U071 COVID-19: Secondary | ICD-10-CM

## 2021-01-22 MED ORDER — PREDNISONE 20 MG PO TABS: 40.0000 mg | ORAL_TABLET | Freq: Every day | ORAL | 0 refills | Status: AC

## 2021-01-22 MED ORDER — OFLOXACIN 0.3 % OT SOLN: 10.0000 [drp] | Freq: Every day | OTIC | 0 refills | Status: AC

## 2021-01-22 MED ORDER — GUAIFENESIN 200 MG PO TABS: 200.0000 mg | ORAL_TABLET | ORAL | 0 refills | Status: DC | PRN

## 2021-01-22 MED ORDER — AMOXICILLIN-POT CLAVULANATE 875-125 MG PO TABS: 1.0000 | ORAL_TABLET | Freq: Two times a day (BID) | ORAL | 0 refills | Status: DC

## 2021-01-22 NOTE — ED Triage Notes (Signed)
Pt tested covid(+) 9 days ago with home rapid test. States she had sore throat and headache at that time. Now pt c/o congestion with cough, left ear painful, states she "is in a bubble" is what her head feels like, states the sounds she hears in the left ear is not normal. States she had an old rx for keflex at home that she initiated herself without relief. Also tried hydrogen peroxide without relief.

## 2021-01-22 NOTE — ED Provider Notes (Signed)
[facility]    CSN: [id number] Arrival date & time: 01/22/21  [id number]      History   Chief Complaint Chief Complaint  Patient presents with   Otalgia    HPI Gordon Carlson is a 30 y.o. female.   Patient presents with pain, nasal congestion, cough that has been present for multiple days.  Patient reports she tested positive for COVID-19 with a rapid test at [facility] approximately 9 days ago.  Although, current symptoms started a few days prior.  Denies any known fevers or sick contacts.  Left ear pain is worse in the left ear and patient has noticed drainage from the left ear.  Denies any decreased hearing to either ear.  Patient has taken ibuprofen and cephalexin for a few days.  Patient reports that she had cephalexin leftover at home and took a few days of the medication.   Otalgia  Past Medical History:  Diagnosis Date   Anxiety    Depression    Lupus ([place])     There are no problems to display for this patient.   Past Surgical History:  Procedure Laterality Date   CESAREAN SECTION     CHOLECYSTECTOMY     FOREARM SURGERY      OB History     Gravida  1   Para      Term      Preterm      AB      Living         SAB      IAB      Ectopic      Multiple      Live Births               Home Medications    Prior to Admission medications   Medication Sig Start Date End Date Taking? Authorizing Provider  amoxicillin-clavulanate (AUGMENTIN) 875-125 MG tablet Take 1 tablet by mouth every 12 (twelve) hours. 01/22/21  Yes Odis Luster, FNP  guaiFENesin 200 MG tablet Take 1 tablet (200 mg total) by mouth every 4 (four) hours as needed for cough or to loosen phlegm. 01/22/21  Yes Odis Luster, FNP  ofloxacin (FLOXIN) 0.3 % OTIC solution Place 10 drops into the left ear daily for 7 days. 01/22/21 01/29/21 Yes Odis Luster, FNP  predniSONE (DELTASONE) 20 MG tablet Take 2 tablets (40 mg total) by mouth daily for 5 days. 01/22/21 01/27/21  Yes Odis Luster, FNP    Family History Family History  Problem Relation Age of Onset   Lupus Father     Social History Social History   Tobacco Use   Smoking status: Former   Smokeless tobacco: Never  Substance Use Topics   Alcohol use: No   Drug use: No     Allergies   Morphine and related, Hydrocodone, and Macrobid [nitrofurantoin]   Review of Systems Review of Systems Per HPI  Physical Exam Triage Vital Signs ED Triage Vitals  Enc Vitals Group     BP 01/22/21 0943 134/79     Pulse Rate 01/22/21 0943 86     Resp 01/22/21 0943 16     Temp 01/22/21 0943 98.8 F (37.1 C)     Temp Source 01/22/21 0943 Oral     SpO2 01/22/21 0943 96 %     Weight --      Height --      Head Circumference --      Peak Flow --  Pain Score 01/22/21 0948 0     Pain Loc --      Pain Edu? --      Excl. in [place]? --    No data found.  Updated Vital Signs BP 134/79 (BP Location: Left Arm)   Pulse 86   Temp 98.8 F (37.1 C) (Oral)   Resp 16   SpO2 96%   Breastfeeding No   Visual Acuity Right Eye Distance:   Left Eye Distance:   Bilateral Distance:    Right Eye Near:   Left Eye Near:    Bilateral Near:     Physical Exam Constitutional:      General: She is not in acute distress.    Appearance: Normal appearance. She is not toxic-appearing or diaphoretic.  HENT:     Head: Normocephalic and atraumatic.     Right Ear: Ear canal normal. No mastoid tenderness. Tympanic membrane is erythematous. Tympanic membrane is not bulging.     Left Ear: Ear canal normal. Tenderness present. No swelling. No mastoid tenderness. Tympanic membrane is erythematous. Tympanic membrane is not bulging.     Ears:     Comments: Erythema noted to left external canal.  No swelling noted.    Nose: Congestion present.     Mouth/Throat:     Mouth: Mucous membranes are moist.     Pharynx: Posterior oropharyngeal erythema present.  Eyes:     Extraocular Movements: Extraocular movements intact.      Conjunctiva/sclera: Conjunctivae normal.     Pupils: Pupils are equal, round, and reactive to light.  Cardiovascular:     Rate and Rhythm: Normal rate and regular rhythm.     Pulses: Normal pulses.     Heart sounds: Normal heart sounds.  Pulmonary:     Effort: Pulmonary effort is normal. No respiratory distress.     Breath sounds: Normal breath sounds. No wheezing.  Abdominal:     General: Abdomen is flat. Bowel sounds are normal.     Palpations: Abdomen is soft.  Musculoskeletal:        General: Normal range of motion.     Cervical back: Normal range of motion.  Skin:    General: Skin is warm and dry.  Neurological:     General: No focal deficit present.     Mental Status: She is alert and oriented to person, place, and time. Mental status is at baseline.  Psychiatric:        Mood and Affect: Mood normal.        Behavior: Behavior normal.     UC Treatments / Results  Labs (all labs ordered are listed, but only abnormal results are displayed) Labs Reviewed - No data to display  EKG   Radiology No results found.  Procedures Procedures (including critical care time)  Medications Ordered in UC Medications - No data to display  Initial Impression / Assessment and Plan / UC Course  I have reviewed the triage vital signs and the nursing notes.  Pertinent labs & imaging results that were available during my care of the patient were reviewed by me and considered in my medical decision making (see chart for details).     Augmentin and ofloxacin prescribed to treat bilateral ear infection.  Ofloxacin to treat left otitis externa.  Do not think that prednisone steroid treatment is necessary at this time, but patient was adamant about receiving steroid treatment.  Patient states that she has taken prednisone steroid before and has tolerated well.  Prescribe  low-dose and short course of prednisone steroid to help alleviate cough and inflammation in the chest.  Also  prescribed guaifenesin to take as needed for cough.Discussed strict return precautions. Patient verbalized understanding and is agreeable with plan.  Final Clinical Impressions(s) / UC Diagnoses   Final diagnoses:  Other non-recurrent acute nonsuppurative otitis media of both ears  Acute upper respiratory infection  COVID-19     Discharge Instructions      You have been prescribed Augmentin antibiotic to treat respiratory infection as well as bilateral ear infection.  You have been prescribed prednisone steroid to decrease inflammation in chest and help with cough.  You have also been prescribed a cough medication to take as needed for cough.     ED Prescriptions     Medication Sig Dispense Auth. Provider   predniSONE (DELTASONE) 20 MG tablet Take 2 tablets (40 mg total) by mouth daily for 5 days. 10 tablet Odis Luster, FNP   amoxicillin-clavulanate (AUGMENTIN) 875-125 MG tablet Take 1 tablet by mouth every 12 (twelve) hours. 14 tablet Phillips Odor E, FNP   guaiFENesin 200 MG tablet Take 1 tablet (200 mg total) by mouth every 4 (four) hours as needed for cough or to loosen phlegm. 30 suppository Odis Luster, FNP   ofloxacin (FLOXIN) 0.3 % OTIC solution Place 10 drops into the left ear daily for 7 days. 3.5 mL Odis Luster, FNP      PDMP not reviewed this encounter.   Odis Luster, FNP 01/22/21 1022

## 2021-01-22 NOTE — Discharge Instructions (Addendum)
You have been prescribed Augmentin antibiotic to treat respiratory infection as well as bilateral ear infection.  You have been prescribed prednisone steroid to decrease inflammation in chest and help with cough.  You have also been prescribed a cough medication to take as needed for cough.

## 2021-07-05 ENCOUNTER — Other Ambulatory Visit: Payer: Self-pay

## 2021-07-05 ENCOUNTER — Emergency Department (HOSPITAL_COMMUNITY)
Admission: EM | Admit: 2021-07-05 | Discharge: 2021-07-05 | Disposition: A | Discharge status: Home / Self Care | Payer: Medicaid Other | Attending: Emergency Medicine | Admitting: Emergency Medicine

## 2021-07-05 ENCOUNTER — Emergency Department (HOSPITAL_COMMUNITY): Payer: Medicaid Other

## 2021-07-05 DIAGNOSIS — R102 Pelvic and perineal pain: Secondary | ICD-10-CM | POA: Diagnosis present

## 2021-07-05 DIAGNOSIS — R52 Pain, unspecified: Secondary | ICD-10-CM

## 2021-07-05 DIAGNOSIS — R42 Dizziness and giddiness: Secondary | ICD-10-CM | POA: Insufficient documentation

## 2021-07-05 DIAGNOSIS — R110 Nausea: Secondary | ICD-10-CM | POA: Insufficient documentation

## 2021-07-05 LAB — WET PREP, GENITAL
Clue Cells Wet Prep HPF POC: NONE SEEN
Sperm: NONE SEEN
Trich, Wet Prep: NONE SEEN
WBC, Wet Prep HPF POC: >=10 — AB (ref <10)
Yeast Wet Prep HPF POC: NONE SEEN

## 2021-07-05 LAB — CBC
HCT: 46.5 % — ABNORMAL HIGH (ref 36.0–46.0)
Hemoglobin: 15.5 g/dL — ABNORMAL HIGH (ref 12.0–15.0)
MCH: 29.9 pg (ref 26.0–34.0)
MCHC: 33.3 g/dL (ref 30.0–36.0)
MCV: 89.8 fL (ref 80.0–100.0)
Platelets: 250 10*3/uL (ref 150–400)
RBC: 5.18 MIL/uL — ABNORMAL HIGH (ref 3.87–5.11)
RDW: 12.6 % (ref 11.5–15.5)
WBC: 5.9 10*3/uL (ref 4.0–10.5)
nRBC: 0 % (ref 0.0–0.2)

## 2021-07-05 LAB — URINALYSIS, ROUTINE W REFLEX MICROSCOPIC
Bilirubin Urine: NEGATIVE
Glucose, UA: NEGATIVE mg/dL
Hgb urine dipstick: NEGATIVE
Ketones, ur: 80 mg/dL — AB
Leukocytes,Ua: NEGATIVE
Nitrite: NEGATIVE
Protein, ur: NEGATIVE mg/dL
Specific Gravity, Urine: 1.01 (ref 1.005–1.030)
pH: 5 (ref 5.0–8.0)

## 2021-07-05 LAB — BASIC METABOLIC PANEL
Anion gap: 16 — ABNORMAL HIGH (ref 5–15)
BUN: 7 mg/dL (ref 6–20)
CO2: 19 mmol/L — ABNORMAL LOW (ref 22–32)
Calcium: 9.4 mg/dL (ref 8.9–10.3)
Chloride: 100 mmol/L (ref 98–111)
Creatinine, Ser: 0.92 mg/dL (ref 0.44–1.00)
GFR, Estimated: >60 mL/min (ref >60)
Glucose, Bld: 86 mg/dL (ref 70–99)
Potassium: 4.2 mmol/L (ref 3.5–5.1)
Sodium: 135 mmol/L (ref 135–145)

## 2021-07-05 LAB — I-STAT BETA HCG BLOOD, ED (MC, WL, AP ONLY): I-stat hCG, quantitative: <5 m[IU]/mL (ref <5)

## 2021-07-05 LAB — DIFFERENTIAL
Abs Immature Granulocytes: 0.02 10*3/uL (ref 0.00–0.07)
Basophils Absolute: 0 10*3/uL (ref 0.0–0.1)
Basophils Relative: 1 %
Eosinophils Absolute: 0.1 10*3/uL (ref 0.0–0.5)
Eosinophils Relative: 2 %
Immature Granulocytes: 0 %
Lymphocytes Relative: 23 %
Lymphs Abs: 1.4 10*3/uL (ref 0.7–4.0)
Monocytes Absolute: 0.6 10*3/uL (ref 0.1–1.0)
Monocytes Relative: 10 %
Neutro Abs: 3.8 10*3/uL (ref 1.7–7.7)
Neutrophils Relative %: 64 %

## 2021-07-05 MED ORDER — SODIUM CHLORIDE 0.9 % IV BOLUS
1000.0000 mL | Freq: Once | INTRAVENOUS | Status: AC
  Administered 2021-07-05: 1000 mL via INTRAVENOUS

## 2021-07-05 MED ORDER — ONDANSETRON HCL 4 MG/2ML IJ SOLN
4.0000 mg | Freq: Once | INTRAMUSCULAR | Status: AC
  Administered 2021-07-05: 4 mg via INTRAVENOUS
  Filled 2021-07-05: qty 2

## 2021-07-05 MED ORDER — ONDANSETRON HCL 4 MG PO TABS: 4.0000 mg | ORAL_TABLET | Freq: Four times a day (QID) | ORAL | 0 refills | Status: DC

## 2021-07-05 MED ORDER — FENTANYL CITRATE PF 50 MCG/ML IJ SOSY
100.0000 ug | PREFILLED_SYRINGE | Freq: Once | INTRAMUSCULAR | Status: AC
  Administered 2021-07-05: 100 ug via INTRAVENOUS
  Filled 2021-07-05: qty 2

## 2021-07-05 MED ORDER — KETOROLAC TROMETHAMINE 30 MG/ML IJ SOLN
30.0000 mg | Freq: Once | INTRAMUSCULAR | Status: AC
  Administered 2021-07-05: 30 mg via INTRAVENOUS
  Filled 2021-07-05: qty 1

## 2021-07-05 NOTE — ED Provider Notes (Addendum)
[place] EMERGENCY DEPARTMENT Provider Note   CSN: [id number] Arrival date & time: 07/05/21  1240     History  Chief Complaint  Patient presents with   Dizziness   Pelvic Pain    Felicia Davila is a 31 y.o. female.   Dizziness Pelvic Pain   Patient is a 31 year old female presenting today with pelvic pain and dizziness.  Her symptoms started about 2 months ago, the pelvic pain is intermittent.  It is mostly on suprapubic lower part of the abdomen but it will moved to the right and left side.  Otherwise been going on for months the pain worsened 3 days ago.  Patient states there is an uncomfortable dull pain that is constant with a sharp pain is still intermittent just more severe.  She has sharp pain that goes from the vagina to the back.  Unable to identify any aggravating factors, states laying down helps alleviate pain.  Is related to movement.  States she has irregular menstrual cycles in general, 2 prior C-sections and cholecystectomy.  She has nausea but no vomiting.  Not currently on any birth control.  Denies any chest pain or shortness of breath.  She does states she feels dizzy starting the last few days, feels like the room is spinning.  Is worse when the pain is worse.  Home Medications Prior to Admission medications   Medication Sig Start Date End Date Taking? Authorizing Provider  ondansetron (ZOFRAN) 4 MG tablet Take 1 tablet (4 mg total) by mouth every 6 (six) hours. 07/05/21  Yes Sherrill Raring, PA-C  amoxicillin-clavulanate (AUGMENTIN) 875-125 MG tablet Take 1 tablet by mouth every 12 (twelve) hours. 01/22/21   Teodora Medici, FNP  guaiFENesin 200 MG tablet Take 1 tablet (200 mg total) by mouth every 4 (four) hours as needed for cough or to loosen phlegm. 01/22/21   Teodora Medici, FNP      Allergies    Morphine and related, Nitrofurantoin, and Hydrocodone    Review of Systems   Review of Systems  Genitourinary:  Positive for pelvic pain.   Neurological:  Positive for dizziness.   Physical Exam Updated Vital Signs BP 114/72    Pulse 86    Temp 97.7 F (36.5 C) (Oral)    Resp 16    SpO2 100%  Physical Exam Vitals and nursing note reviewed. Exam conducted with a chaperone present.  Constitutional:      Appearance: Normal appearance.  HENT:     Head: Normocephalic and atraumatic.  Eyes:     General: No scleral icterus.       Right eye: No discharge.        Left eye: No discharge.     Extraocular Movements: Extraocular movements intact.     Pupils: Pupils are equal, round, and reactive to light.  Cardiovascular:     Rate and Rhythm: Regular rhythm. Tachycardia present.     Pulses: Normal pulses.     Heart sounds: Normal heart sounds. No murmur heard.   No friction rub. No gallop.  Pulmonary:     Effort: Pulmonary effort is normal. No respiratory distress.     Breath sounds: Normal breath sounds.  Abdominal:     General: Abdomen is flat. Bowel sounds are normal. There is no distension.     Palpations: Abdomen is soft.     Tenderness: There is no abdominal tenderness.  Genitourinary:    Comments: Bilateral adnexal tenderness, no discharge. No chandelier sign  Skin:  General: Skin is warm and dry.     Coloration: Skin is not jaundiced.  Neurological:     Mental Status: She is alert. Mental status is at baseline.     Coordination: Coordination normal.    ED Results / Procedures / Treatments   Labs (all labs ordered are listed, but only abnormal results are displayed) Labs Reviewed  WET PREP, GENITAL - Abnormal; Notable for the following components:      Result Value   WBC, Wet Prep HPF POC >=10 (*)    All other components within normal limits  BASIC METABOLIC PANEL - Abnormal; Notable for the following components:   CO2 19 (*)    Anion gap 16 (*)    All other components within normal limits  CBC - Abnormal; Notable for the following components:   RBC 5.18 (*)    Hemoglobin 15.5 (*)    HCT 46.5 (*)     All other components within normal limits  URINALYSIS, ROUTINE W REFLEX MICROSCOPIC - Abnormal; Notable for the following components:   Color, Urine STRAW (*)    Ketones, ur 80 (*)    All other components within normal limits  DIFFERENTIAL  I-STAT BETA HCG BLOOD, ED ([facility], [facility], [facility] ONLY)  GC/CHLAMYDIA PROBE AMP ([place]) NOT AT [facility]    EKG EKG Interpretation  Date/Time:  Sunday July 05 2021 12:47:22 EDT Ventricular Rate:  124 PR Interval:  112 QRS Duration: 68 QT Interval:  406 QTC Calculation: 583 R Axis:   60 Text Interpretation: Sinus tachycardia Low voltage QRS Cannot rule out Anterior infarct , age undetermined Abnormal ECG When compared with ECG of 15-Feb-2019 16:48, PREVIOUS ECG IS PRESENT Confirmed by Davonna Belling [phone]) on 07/05/2021 1:31:43 PM  Radiology US PELVIC COMPLETE W TRANSVAGINAL AND TORSION R/O  Result Date: 07/05/2021 CLINICAL DATA:  pelvic pain.  Last menstrual period 06/18/2021. EXAM: TRANSABDOMINAL AND TRANSVAGINAL ULTRASOUND OF PELVIS DOPPLER ULTRASOUND OF OVARIES TECHNIQUE: Both transabdominal and transvaginal ultrasound examinations of the pelvis were performed. Transabdominal technique was performed for global imaging of the pelvis including uterus, ovaries, adnexal regions, and pelvic cul-de-sac. It was necessary to proceed with endovaginal exam following the transabdominal exam to visualize the endometrium and bilateral ovaries/adnexa. Color and duplex Doppler ultrasound was utilized to evaluate blood flow to the ovaries. COMPARISON:  None. FINDINGS: Uterus Measurements: 7.4 x 3.4 x 5 cm = volume: 65 mL. No fibroids or other mass visualized. Endometrium Thickness: 7 mm.  No focal abnormality visualized. Right ovary Measurements: 3.1 x 1.9 x 1.6 cm = volume: 5 mL. Normal appearance/no adnexal mass. Left ovary Measurements: 3.9 x 1.8 x 2.3 cm = volume: 9 mL. Normal appearance/no adnexal mass. Pulsed Doppler evaluation of both ovaries demonstrates normal  low-resistance arterial and venous waveforms. Other findings Trace pelvic free fluid. IMPRESSION: Unremarkable pelvic ultrasound. Electronically Signed   By: Iven Finn M.D.   On: 07/05/2021 15:07    Procedures Procedures    Medications Ordered in ED Medications  sodium chloride 0.9 % bolus 1,000 mL (1,000 mLs Intravenous New Bag/Given 07/05/21 1507)  ketorolac (TORADOL) 30 MG/ML injection 30 mg (30 mg Intravenous Given 07/05/21 1508)  fentaNYL (SUBLIMAZE) injection 100 mcg (100 mcg Intravenous Given 07/05/21 1558)  ondansetron (ZOFRAN) injection 4 mg (4 mg Intravenous Given 07/05/21 1558)    ED Course/ Medical Decision Making/ A&P  Medical Decision Making Amount and/or Complexity of Data Reviewed Labs: ordered. Radiology: ordered.  Risk Prescription drug management.   This patient presents to the ED for concern of pelvic pain, this involves an extensive number of treatment options, and is a complaint that carries with it a high risk of complications and morbidity.  The differential diagnosis includes ovarian torsion, ruptured ectopic pregnancy, ruptured ovarian cyst, tubo-ovarian abscess, UTI, pyelonephritis, BV, trichomoniasis, pelvic inflammatory disease, other   Additional history obtained:    Reviewed surgical history and patient is status post C-section and cholecystectomy.  Currently on birth control, medical history is notable for anxiety and depression but currently not on any medications.    Lab Tests:  I ordered, viewed, and personally interpreted labs.  The pertinent results include:   -CBC without leukocytosis.  No anemia but appears slightly hemoconcentrated. - BMP does not show any gross electrolyte derangement.  Slightly decreased bicarb, no hypoglycemia noted. - Beta hcg negative, not pregnant. - Wet prep pending - GC chlamydia pending - UA negative    Imaging Studies ordered:  I directly visualized the pelvic ultrasound,  which showed no acute process   I agree with the radiologist interpretation    ECG/Cardiac monitoring:   Per my interpretation, EKG shows sinus tachycardia.  There is slight prolongation of the QT interval so we will abstain from any antiemetics at this time.  On the monitor there is no evidence of V-fib despite her prolonged dizziness, do not think the QT prolongation is contributing to her intermittent dizziness.  The patient was maintained on a cardiac monitor.  Visualized monitor strip which showed sinus rhythm and sinus tachycardia with a rate up to 130 in the room. Per my interpretation.    Medicines ordered and prescription drug management:  I ordered medication including: Toradol, fluids  I have reviewed the patients home medicines and have made adjustments as needed   Test Considered:  Consider CT abdomen pelvis but ultimately patient does not have leukocytosis, not febrile there is no real focal tenderness to the abdomen.  Given the pain is pelvic I suspect this is more gynecologic in nature than intra-abdominal.  I discussed this with my attending Davonna Belling who is in agreement.    Reevaluation:  After the interventions noted above, I reevaluated the patient and found improvement of pain.    Problems addressed / ED Course: Patient is a 31 year old female presenting due to pelvic pain and dizziness.  The pain has been intermittent with periods of picture that could be suggestive of ovarian torsion.  It did become constant 3 days ago although the sharpness comes and goes, she also having some lower abdominal pain which is likely radiation but given the tenderness cannot rule out lower intra-abdominal pathology such as appendicitis at this time.  However, she does not have a leukocytosis and the pain is been long enough where I would expect her to be febrile, have a leukocytosis, have more obvious peritoneal signs as she did have an intra-abdominal process  occurring.  Laboratory work-up is as documented above.  Ultrasound unremarkable, I doubt ovarian torsion.  I considered ultimately think PID, pyelonephritis, ovarian torsion, ectopic pregnancy or any emergent etiology for her pelvic pain is unlikely.  Additionally the fact pain has been going on for multiple months is somewhat reassuring.   I think patient is appropriate for outpatient discharge and follow-up with gynecology.  I provided resources for gynecology and primary care, patient also given Zofran as needed for any  nausea that she experiences at home.  These instructions were discussed with the patient.  Social Determinants of Health: No consistent out patient PCP. Provided resource and gynecology follow up   Disposition:   After consideration of the diagnostic results and the patients response to treatment, I feel that the patent would benefit from D/C if UA normal.       Final Clinical Impression(s) / ED Diagnoses Final diagnoses:  Pelvic pain in female  Dizziness    Rx / DC Orders ED Discharge Orders          Ordered    ondansetron (ZOFRAN) 4 MG tablet  Every 6 hours        07/05/21 1552              Sherrill Raring, PA-C 07/05/21 1607    Sherrill Raring, PA-C 07/05/21 1609    Davonna Belling, MD 07/05/21 2048

## 2021-07-05 NOTE — Discharge Instructions (Addendum)
Your work-up today looked good.  The ultrasound did not show any signs of ovarian torsion or emergent source of the pelvic pain.  Though the pain is real, it would be better followed up on by a gynecologist.  Information provided above for gynecology, also provided information for primary care provider if you currently do not have 1.  Drink plenty of fluids, take Tylenol and ibuprofen for pain.  If you have any nausea at home you can take the Zofran every 8 hours as needed.  If the pain becomes more abdominal and localized you should return back to the ED for additional evaluation as we do not scan your abdomen. ?

## 2021-07-05 NOTE — Care Management (Signed)
Patient asked about financial assistance for OBGYN follow up. Sent information over to [facility] for consideration. Also she can call the OBGYN office to make payment arrangemets ?

## 2021-07-05 NOTE — ED Notes (Signed)
Awaiting discharge at this time per PA for social work  ?

## 2021-07-05 NOTE — ED Notes (Signed)
Social worker at bedside.

## 2021-07-05 NOTE — ED Triage Notes (Signed)
Pt here for pelvic pain x3 days, denies discharge or urinary symptoms. Pt also c/o dizziness, lightheadedness, tachycardia and HA x2 days. No LOC ?

## 2021-07-06 LAB — GC/CHLAMYDIA PROBE AMP (~~LOC~~) NOT AT ARMC
Chlamydia: NEGATIVE
Comment: NEGATIVE
Comment: NORMAL
Neisseria Gonorrhea: NEGATIVE

## 2021-07-23 ENCOUNTER — Inpatient Hospital Stay (HOSPITAL_COMMUNITY)
Admission: AD | Admit: 2021-07-23 | Discharge: 2021-07-23 | Disposition: A | Discharge status: Home / Self Care | Payer: Medicaid Other | Attending: Obstetrics and Gynecology | Admitting: Obstetrics and Gynecology

## 2021-07-23 ENCOUNTER — Inpatient Hospital Stay (HOSPITAL_COMMUNITY): Payer: Medicaid Other

## 2021-07-23 ENCOUNTER — Encounter (HOSPITAL_COMMUNITY): Payer: Self-pay | Admitting: Obstetrics and Gynecology

## 2021-07-23 DIAGNOSIS — O219 Vomiting of pregnancy, unspecified: Secondary | ICD-10-CM | POA: Diagnosis not present

## 2021-07-23 DIAGNOSIS — R109 Unspecified abdominal pain: Secondary | ICD-10-CM

## 2021-07-23 DIAGNOSIS — O209 Hemorrhage in early pregnancy, unspecified: Secondary | ICD-10-CM | POA: Insufficient documentation

## 2021-07-23 DIAGNOSIS — O26891 Other specified pregnancy related conditions, first trimester: Secondary | ICD-10-CM | POA: Insufficient documentation

## 2021-07-23 DIAGNOSIS — R1032 Left lower quadrant pain: Secondary | ICD-10-CM | POA: Insufficient documentation

## 2021-07-23 DIAGNOSIS — O3680X0 Pregnancy with inconclusive fetal viability, not applicable or unspecified: Secondary | ICD-10-CM

## 2021-07-23 DIAGNOSIS — O0281 Inappropriate change in quantitative human chorionic gonadotropin (hCG) in early pregnancy: Secondary | ICD-10-CM | POA: Insufficient documentation

## 2021-07-23 DIAGNOSIS — Z3A01 Less than 8 weeks gestation of pregnancy: Secondary | ICD-10-CM | POA: Diagnosis not present

## 2021-07-23 LAB — COMPREHENSIVE METABOLIC PANEL
ALT: 50 U/L — ABNORMAL HIGH (ref 0–44)
AST: 27 U/L (ref 15–41)
Albumin: 4 g/dL (ref 3.5–5.0)
Alkaline Phosphatase: 62 U/L (ref 38–126)
Anion gap: 7 (ref 5–15)
BUN: 6 mg/dL (ref 6–20)
CO2: 26 mmol/L (ref 22–32)
Calcium: 9.2 mg/dL (ref 8.9–10.3)
Chloride: 107 mmol/L (ref 98–111)
Creatinine, Ser: 0.77 mg/dL (ref 0.44–1.00)
GFR, Estimated: >60 mL/min (ref >60)
Glucose, Bld: 94 mg/dL (ref 70–99)
Potassium: 4.2 mmol/L (ref 3.5–5.1)
Sodium: 140 mmol/L (ref 135–145)
Total Bilirubin: 0.5 mg/dL (ref 0.3–1.2)
Total Protein: 6.9 g/dL (ref 6.5–8.1)

## 2021-07-23 LAB — CBC
HCT: 40.5 % (ref 36.0–46.0)
Hemoglobin: 13.3 g/dL (ref 12.0–15.0)
MCH: 29.8 pg (ref 26.0–34.0)
MCHC: 32.8 g/dL (ref 30.0–36.0)
MCV: 90.8 fL (ref 80.0–100.0)
Platelets: 228 10*3/uL (ref 150–400)
RBC: 4.46 MIL/uL (ref 3.87–5.11)
RDW: 13.2 % (ref 11.5–15.5)
WBC: 5.4 10*3/uL (ref 4.0–10.5)
nRBC: 0 % (ref 0.0–0.2)

## 2021-07-23 LAB — WET PREP, GENITAL
Sperm: NONE SEEN
Trich, Wet Prep: NONE SEEN
WBC, Wet Prep HPF POC: <10 (ref <10)
Yeast Wet Prep HPF POC: NONE SEEN

## 2021-07-23 LAB — ABO/RH: ABO/RH(D): A POS

## 2021-07-23 LAB — URINALYSIS, ROUTINE W REFLEX MICROSCOPIC
Bilirubin Urine: NEGATIVE
Glucose, UA: NEGATIVE mg/dL
Ketones, ur: NEGATIVE mg/dL
Leukocytes,Ua: NEGATIVE
Nitrite: NEGATIVE
Protein, ur: NEGATIVE mg/dL
Specific Gravity, Urine: 1.015 (ref 1.005–1.030)
pH: 7 (ref 5.0–8.0)

## 2021-07-23 LAB — HCG, QUANTITATIVE, PREGNANCY: hCG, Beta Chain, Quant, S: 203 m[IU]/mL — ABNORMAL HIGH (ref <5)

## 2021-07-23 MED ORDER — CYCLOBENZAPRINE HCL 5 MG PO TABS
10.0000 mg | ORAL_TABLET | Freq: Once | ORAL | Status: AC
  Administered 2021-07-23: 10 mg via ORAL
  Filled 2021-07-23: qty 2

## 2021-07-23 MED ORDER — OXYCODONE-ACETAMINOPHEN 5-325 MG PO TABS
2.0000 | ORAL_TABLET | Freq: Once | ORAL | Status: AC
  Administered 2021-07-23: 2 via ORAL
  Filled 2021-07-23: qty 2

## 2021-07-23 MED ORDER — CYCLOBENZAPRINE HCL 10 MG PO TABS: 10.0000 mg | ORAL_TABLET | Freq: Two times a day (BID) | ORAL | 0 refills | Status: DC | PRN

## 2021-07-23 MED ORDER — METOCLOPRAMIDE HCL 10 MG PO TABS
10.0000 mg | ORAL_TABLET | Freq: Once | ORAL | Status: AC
  Administered 2021-07-23: 10 mg via ORAL
  Filled 2021-07-23: qty 1

## 2021-07-23 NOTE — MAU Note (Signed)
.  Felicia Davila is a 31 y.o. at Unknown here in MAU reporting: pt c/o lower abd pain in LLQ. Pain is sharp and pt stated it feels like someone is trying to stab me with a knife.c/o dark brown discharge ?LMP: 06/17/2021 ?Onset of complaint: 2 hours ago ?Pain score: 7/10 ?Vitals:  ? 07/23/21 1239  ?BP: 123/68  ?Pulse: (!) 108  ?Resp: 18  ?Temp: 98.8 ?F (37.1 ?C)  ?   ?FHT:n/a ?Lab orders placed from triage:   u/a ?

## 2021-07-23 NOTE — MAU Provider Note (Signed)
?History  ?  ? ?CSN: [id number] ? ?Arrival date and time: 07/23/21 1211 ? ? Event Date/Time  ? First Provider Initiated Contact with Patient 07/23/21 1415   ?  ? ?Chief Complaint  ?Patient presents with  ? Abdominal Pain  ? ?Felicia Davila is a 31 y.o. [id number] at 71w1dby Definite LMP of 06/17/2021.  She presents today for Abdominal Pain.  She reports her pain started about one week ago on her left side.  She describes the pain as a dull constant sensation that is intermittently sharp.  She rates the pain a 7/10 and states it is worsened with movement, but not relieved with any known factors.  Patient questions if the pain could be gas pain/pressure. She reports some nausea/vomiting and requests medication.  She also reports some vaginal bleeding that has been ongoing.  She describes it as a brown color and states it is a "trickle." She denies recent sexual activity.  ? ? ?OB History   ? ? Gravida  ?3  ? Para  ?2  ? Term  ?1  ? Preterm  ?1  ? AB  ?   ? Living  ?2  ?  ? ? SAB  ?   ? IAB  ?   ? Ectopic  ?   ? Multiple  ?   ? Live Births  ?2  ?   ?  ?  ? ? ?Past Medical History:  ?Diagnosis Date  ? Anxiety   ? Depression   [place]   ? ? ?Past Surgical History:  ?Procedure Laterality Date  ? CESAREAN SECTION    ? CHOLECYSTECTOMY    ? FOREARM SURGERY    ? ? ?Family History  ?Problem Relation Age of Onset  ? Lupus Father   ? ? ?Social History  ? ?Tobacco Use  ? Smoking status: Former  ? Smokeless tobacco: Never  ?Substance Use Topics  ? Alcohol use: No  ? Drug use: No  ? ? ?Allergies:  ?Allergies  ?Allergen Reactions  ? Morphine And Related Anaphylaxis, Rash and Other (See Comments)  ?  Per extensive conversation with patient, surgical attending and surgical PA, patient does NOT have an anaphylactic reaction to morphine. Has taken for short periods in the past without issues. Pins and needle feeling in extremities. (??) ? ?  ? Nitrofurantoin Nausea And Vomiting and Other (See Comments)  ?  Also, "felt "weird in the  head" ?  ? Hydrocodone Other (See Comments)  ?  Pins and needle feeling in extremities  ? ? ?Medications Prior to Admission  ?Medication Sig Dispense Refill Last Dose  ? amoxicillin-clavulanate (AUGMENTIN) 875-125 MG tablet Take 1 tablet by mouth every 12 (twelve) hours. (Patient not taking: Reported on 07/23/2021) 14 tablet 0 Not Taking  ? guaiFENesin 200 MG tablet Take 1 tablet (200 mg total) by mouth every 4 (four) hours as needed for cough or to loosen phlegm. (Patient not taking: Reported on 07/05/2021) 30 suppository 0   ? ibuprofen (ADVIL) 200 MG tablet Take 400-600 mg by mouth every 6 (six) hours as needed for mild pain or headache.     ? ondansetron (ZOFRAN) 4 MG tablet Take 1 tablet (4 mg total) by mouth every 6 (six) hours. 12 tablet 0   ? ? ?Review of Systems  ?Constitutional:  Negative for chills and fever.  ?Gastrointestinal:  Positive for abdominal pain (Left side), nausea and vomiting.  ?Genitourinary:  Positive for vaginal bleeding and vaginal discharge. Negative for difficulty  urinating and dysuria.  ?Neurological:  Positive for dizziness, light-headedness and headaches.  ?Physical Exam  ? ?Blood pressure 115/69, pulse 93, temperature 98.8 ?F (37.1 ?C), resp. rate 18, height '5\' 7"'$  (1.702 m), weight 76.7 kg, last menstrual period 06/17/2021. ? ?Physical Exam ?Vitals reviewed.  ?Constitutional:   ?   Appearance: Normal appearance. She is well-developed.  ?HENT:  ?   Head: Normocephalic and atraumatic.  ?Eyes:  ?   Conjunctiva/sclera: Conjunctivae normal.  ?Cardiovascular:  ?   Rate and Rhythm: Normal rate.  ?   Heart sounds: Normal heart sounds.  ?Pulmonary:  ?   Effort: Pulmonary effort is normal. No respiratory distress.  ?   Breath sounds: Normal breath sounds.  ?Abdominal:  ?   General: Bowel sounds are normal.  ?   Tenderness: There is abdominal tenderness in the left lower quadrant.  ?Musculoskeletal:     ?   General: Normal range of motion.  ?   Cervical back: Normal range of motion.  ?Skin: ?    General: Skin is warm and dry.  ?Neurological:  ?   Mental Status: She is alert and oriented to person, place, and time.  ?Psychiatric:     ?   Mood and Affect: Mood normal.     ?   Behavior: Behavior normal.  ? ? ?MAU Course  ?Procedures ?Results for orders placed or performed during the hospital encounter of 07/23/21 (from the past 24 hour(s))  ?Urinalysis, Routine w reflex microscopic Urine, Clean Catch     Status: Abnormal  ? Collection Time: 07/23/21 12:41 PM  ?Result Value Ref Range  ? Color, Urine YELLOW YELLOW  ? APPearance HAZY (A) CLEAR  ? Specific Gravity, Urine 1.015 1.005 - 1.030  ? pH 7.0 5.0 - 8.0  ? Glucose, UA NEGATIVE NEGATIVE mg/dL  ? Hgb urine dipstick LARGE (A) NEGATIVE  ? Bilirubin Urine NEGATIVE NEGATIVE  ? Ketones, ur NEGATIVE NEGATIVE mg/dL  ? Protein, ur NEGATIVE NEGATIVE mg/dL  ? Nitrite NEGATIVE NEGATIVE  ? Leukocytes,Ua NEGATIVE NEGATIVE  ? RBC / HPF 0-5 0 - 5 RBC/hpf  ? WBC, UA 0-5 0 - 5 WBC/hpf  ? Bacteria, UA RARE (A) NONE SEEN  ? Squamous Epithelial / LPF 11-20 0 - 5  ?CBC     Status: None  ? Collection Time: 07/23/21 12:50 PM  ?Result Value Ref Range  ? WBC 5.4 4.0 - 10.5 K/uL  ? RBC 4.46 3.87 - 5.11 MIL/uL  ? Hemoglobin 13.3 12.0 - 15.0 g/dL  ? HCT 40.5 36.0 - 46.0 %  ? MCV 90.8 80.0 - 100.0 fL  ? MCH 29.8 26.0 - 34.0 pg  ? MCHC 32.8 30.0 - 36.0 g/dL  ? RDW 13.2 11.5 - 15.5 %  ? Platelets 228 150 - 400 K/uL  ? nRBC 0.0 0.0 - 0.2 %  ?ABO/Rh     Status: None  ? Collection Time: 07/23/21 12:50 PM  ?Result Value Ref Range  ? ABO/RH(D) A POS   ? No rh immune globuloin    ?  NOT A RH IMMUNE GLOBULIN CANDIDATE, PT RH POSITIVE ?Performed at [facility], [place] [address], [place], [place] [address] ?  ?hCG, quantitative, pregnancy     Status: Abnormal  ? Collection Time: 07/23/21 12:50 PM  ?Result Value Ref Range  ? hCG, Beta Chain, Quant, S 203 (H) <5 mIU/mL  ?Comprehensive metabolic panel     Status: Abnormal  ? Collection Time: 07/23/21 12:50 PM  ?Result Value Ref  Range  ? Sodium  140 135 - 145 mmol/L  ? Potassium 4.2 3.5 - 5.1 mmol/L  ? Chloride 107 98 - 111 mmol/L  ? CO2 26 22 - 32 mmol/L  ? Glucose, Bld 94 70 - 99 mg/dL  ? BUN 6 6 - 20 mg/dL  ? Creatinine, Ser 0.77 0.44 - 1.00 mg/dL  ? Calcium 9.2 8.9 - 10.3 mg/dL  ? Total Protein 6.9 6.5 - 8.1 g/dL  ? Albumin 4.0 3.5 - 5.0 g/dL  ? AST 27 15 - 41 U/L  ? ALT 50 (H) 0 - 44 U/L  ? Alkaline Phosphatase 62 38 - 126 U/L  ? Total Bilirubin 0.5 0.3 - 1.2 mg/dL  ? GFR, Estimated >60 >60 mL/min  ? Anion gap 7 5 - 15  ?Wet prep, genital     Status: Abnormal  ? Collection Time: 07/23/21  1:07 PM  ? Specimen: Vaginal  ?Result Value Ref Range  ? Yeast Wet Prep HPF POC NONE SEEN NONE SEEN  ? Trich, Wet Prep NONE SEEN NONE SEEN  ? Clue Cells Wet Prep HPF POC PRESENT (A) NONE SEEN  ? WBC, Wet Prep HPF POC <10 <10  ? Sperm NONE SEEN   ? ?US OB LESS THAN 14 WEEKS WITH OB TRANSVAGINAL ? ?Result Date: 07/23/2021 ?CLINICAL DATA:  Left lower quadrant abdominal pain over the last day. EXAM: OBSTETRIC <14 WK ULTRASOUND TECHNIQUE: Transabdominal ultrasound was performed for evaluation of the gestation as well as the maternal uterus and adnexal regions. COMPARISON:  Pelvic ultrasound 07/05/2021 FINDINGS: Intrauterine gestational sac: Not present Maternal uterus/adnexae: The uterus appears normal. Endometrial tissue appears normal. Both ovaries are normal, the right measuring 2.7 by 1.5 x 2.0 cm and the left measuring 3.8 x 1.4 x 1.8 cm. Trace amount of free fluid. IMPRESSION: No intrauterine pregnancy. Normal appearance of the uterus and ovaries. Trace amount of free fluid. Electronically Signed   By: Nelson Chimes M.D.   On: 07/23/2021 15:49   ? ?MDM ?Exam ?Culture: Wet Prep and GC/CT ?Labs: UA, UPT, CBC, CMP, hCG, ABO ?Ultrasound ?Antiemetic ?Pain Medication ?Assessment and Plan  ?31 year old ?[id number] at 5.1 weeks ?Vaginal Bleeding ?Abdominal Pain ? ?-Reviewed POC with patient. ?-Reviewed previous labs from outside hospital via care everywhere. Previous hCG  175 ?-Informed that hCG level will need to be repeated in 48 hours for adequate evaluation. ?-Exam performed.  ?-Discussed labs today and Ultrasound. ?-Patient offered and accepts pain medication. Will give pe

## 2021-07-24 LAB — GC/CHLAMYDIA PROBE AMP (~~LOC~~) NOT AT ARMC
Chlamydia: NEGATIVE
Comment: NEGATIVE
Comment: NORMAL
Neisseria Gonorrhea: NEGATIVE

## 2021-07-25 ENCOUNTER — Inpatient Hospital Stay (HOSPITAL_COMMUNITY): Payer: Medicaid Other

## 2021-07-25 ENCOUNTER — Encounter (HOSPITAL_COMMUNITY): Payer: Self-pay | Admitting: Obstetrics & Gynecology

## 2021-07-25 ENCOUNTER — Other Ambulatory Visit: Payer: Self-pay

## 2021-07-25 ENCOUNTER — Inpatient Hospital Stay (HOSPITAL_COMMUNITY)
Admission: EM | Admit: 2021-07-25 | Discharge: 2021-07-26 | Disposition: A | Discharge status: Home / Self Care | Payer: Medicaid Other | Attending: Obstetrics & Gynecology | Admitting: Obstetrics & Gynecology

## 2021-07-25 DIAGNOSIS — O26891 Other specified pregnancy related conditions, first trimester: Secondary | ICD-10-CM | POA: Insufficient documentation

## 2021-07-25 DIAGNOSIS — O3680X0 Pregnancy with inconclusive fetal viability, not applicable or unspecified: Secondary | ICD-10-CM

## 2021-07-25 DIAGNOSIS — Z881 Allergy status to other antibiotic agents status: Secondary | ICD-10-CM | POA: Insufficient documentation

## 2021-07-25 DIAGNOSIS — O26899 Other specified pregnancy related conditions, unspecified trimester: Secondary | ICD-10-CM

## 2021-07-25 DIAGNOSIS — R109 Unspecified abdominal pain: Secondary | ICD-10-CM | POA: Diagnosis present

## 2021-07-25 DIAGNOSIS — Z3A01 Less than 8 weeks gestation of pregnancy: Secondary | ICD-10-CM | POA: Diagnosis not present

## 2021-07-25 DIAGNOSIS — Z885 Allergy status to narcotic agent status: Secondary | ICD-10-CM | POA: Insufficient documentation

## 2021-07-25 DIAGNOSIS — R1032 Left lower quadrant pain: Secondary | ICD-10-CM | POA: Diagnosis not present

## 2021-07-25 NOTE — MAU Provider Note (Signed)
?History  ?  ? ?CSN: [id number] ? ?Arrival date and time: 07/25/21 2241 ? ? None  ?  ? ?Chief Complaint  ?Patient presents with  ? Abdominal Pain  ? ?HPI ?Felicia Davila is a 31 y.o. [id number] at 26w3dby LMP who presents to MAU for abdominal pain. Patient reports around 1700 she started having LLQ pain. Pain is a constant, stabbing sensation that she rates 7/10. She reports she had an episode of vomiting at one point because the pain was so bad. She tried taking Tylenol around 1800, but is unsure if she kept it down because of the vomiting. She says she has a lot of pressure with voiding or bowel movements, but denies urinary s/s, constipation, or diarrhea. She reports intermittent vaginal bleeding and spotting for the past few days, not "as heavy" today, although she is not having to wear a pad. ? ?OB History   ? ? Gravida  ?3  ? Para  ?2  ? Term  ?1  ? Preterm  ?1  ? AB  ?   ? Living  ?2  ?  ? ? SAB  ?   ? IAB  ?   ? Ectopic  ?   ? Multiple  ?   ? Live Births  ?2  ?   ?  ?  ? ? ?Past Medical History:  ?Diagnosis Date  ? Anxiety   ? Depression   ? Lupus ([place]   ? ? ?Past Surgical History:  ?Procedure Laterality Date  ? CESAREAN SECTION    ? CHOLECYSTECTOMY    ? FOREARM SURGERY    ? ? ?Family History  ?Problem Relation Age of Onset  ? Lupus Father   ? ? ?Social History  ? ?Tobacco Use  ? Smoking status: Former  ? Smokeless tobacco: Never  ?Substance Use Topics  ? Alcohol use: No  ? Drug use: No  ? ? ?Allergies:  ?Allergies  ?Allergen Reactions  ? Morphine And Related Anaphylaxis, Rash and Other (See Comments)  ?  Per extensive conversation with patient, surgical attending and surgical PA, patient does NOT have an anaphylactic reaction to morphine. Has taken for short periods in the past without issues. Pins and needle feeling in extremities. (??) ? ?  ? Nitrofurantoin Nausea And Vomiting and Other (See Comments)  ?  Also, "felt "weird in the head" ?  ? Hydrocodone Other (See Comments)  ?  Pins and needle feeling in  extremities  ? ? ?No medications prior to admission.  ? ?Review of Systems  ?Constitutional: Negative.   ?Respiratory: Negative.    ?Cardiovascular: Negative.   ?Gastrointestinal:  Positive for abdominal pain and vomiting. Negative for constipation and diarrhea.  ?Genitourinary:  Positive for vaginal bleeding. Negative for dysuria and frequency.  ?Musculoskeletal: Negative.   ?Neurological: Negative.   ? ?Physical Exam  ? ?Blood pressure 120/67, pulse 99, temperature 97.9 ?F (36.6 ?C), temperature source Oral, resp. rate 18, height '5\' 7"'$  (1.702 m), weight 78 kg, last menstrual period 06/17/2021, SpO2 100 %. ? ?Physical Exam ?Vitals and nursing note reviewed.  ?Constitutional:   ?   General: She is not in acute distress. ?   Comments: tearful  ?Eyes:  ?   Extraocular Movements: Extraocular movements intact.  ?   Pupils: Pupils are equal, round, and reactive to light.  ?Cardiovascular:  ?   Rate and Rhythm: Tachycardia present.  ?Pulmonary:  ?   Effort: Pulmonary effort is normal.  ?Abdominal:  ?   Palpations: Abdomen  is soft.  ?   Tenderness: There is abdominal tenderness in the left lower quadrant. There is no guarding or rebound.  ?Skin: ?   General: Skin is warm and dry.  ?Neurological:  ?   General: No focal deficit present.  ?   Mental Status: She is alert and oriented to person, place, and time.  ?Psychiatric:     ?   Mood and Affect: Mood normal.     ?   Behavior: Behavior normal.  ? ?US OB Transvaginal ? ?Result Date: 07/26/2021 ?CLINICAL DATA:  Left lower quadrant abdominal pain, positive beta HCG EXAM: TRANSVAGINAL OB ULTRASOUND TECHNIQUE: Transvaginal ultrasound was performed for complete evaluation of the gestation as well as the maternal uterus, adnexal regions, and pelvic cul-de-sac. COMPARISON:  07/23/2021 FINDINGS: Intrauterine gestational sac: None identified Maternal uterus/adnexae: The uterus is anteverted. The cervix is unremarkable. No intrauterine masses are seen. C-section defect noted within the  lower anterior uterine segment. The endometrial stripe is uniform measuring 6 mm in thickness. No free fluid seen within the cul-de-sac. The maternal ovaries are normal in size and echogenicity. Multiple follicles are seen within the ovaries bilaterally. No adnexal masses are seen. IMPRESSION: No intrauterine gestational sac identified. No interval change since prior examination. Electronically Signed   By: Fidela Salisbury M.D.   On: 07/26/2021 00:27   ? ?MAU Course  ?Procedures ? ?MDM ?HCG slightly increased from previous MAU visit, however not an appropriate rise. [place] unchanged. Given various HCG levels from MAU and [facility], I reviewed results with Dr. Roselie Awkward. Will repeat HCG in 48 hours. Patient given strict return precautions.  ? ?Assessment and Plan  ?Pregnancy of unknown location ?Abdominal pain affecting pregnancy ? ? ?- Discharge home in stable condition ?- F/u bHCG in 48 hours at [facility] ?- Strict return precautions reviewed at length. Return to MAU sooner or as needed for worsening symptoms  ? ? ?Renee Harder, CNM ?07/26/2021, 2:10 AM  ?

## 2021-07-25 NOTE — MAU Note (Signed)
.  Felicia Davila is a 31 y.o. at 73w3dhere in MAU reporting: with c/o increased abdominal pain on left side since her last visit to MAU on 07/22/21.  Pt reports taking Tylenol around 1900.   ?LMP: 06/17/21 ?Onset of complaint: 1700 ?Pain score: 7 ?Vitals:  ? 07/25/21 2309 07/25/21 2310  ?BP:  120/75  ?Pulse:  (!) 103  ?Resp:  17  ?Temp:  98.4 ?F (36.9 ?C)  ?SpO2: 100% 100%  ?   ?FQJF:HLKTGYto obtain due to early gestation  ?Lab orders placed from triage:   UA ?

## 2021-07-26 DIAGNOSIS — O3680X0 Pregnancy with inconclusive fetal viability, not applicable or unspecified: Secondary | ICD-10-CM | POA: Diagnosis not present

## 2021-07-26 LAB — URINALYSIS, ROUTINE W REFLEX MICROSCOPIC
Bilirubin Urine: NEGATIVE
Glucose, UA: NEGATIVE mg/dL
Ketones, ur: NEGATIVE mg/dL
Leukocytes,Ua: NEGATIVE
Nitrite: NEGATIVE
Protein, ur: NEGATIVE mg/dL
Specific Gravity, Urine: 1.006 (ref 1.005–1.030)
pH: 6 (ref 5.0–8.0)

## 2021-07-26 LAB — HCG, QUANTITATIVE, PREGNANCY: hCG, Beta Chain, Quant, S: 295 m[IU]/mL — ABNORMAL HIGH (ref <5)

## 2021-07-28 ENCOUNTER — Encounter (HOSPITAL_COMMUNITY): Payer: Self-pay | Admitting: Family Medicine

## 2021-07-28 ENCOUNTER — Ambulatory Visit (INDEPENDENT_AMBULATORY_CARE_PROVIDER_SITE_OTHER): Payer: Self-pay

## 2021-07-28 ENCOUNTER — Inpatient Hospital Stay (HOSPITAL_COMMUNITY): Payer: Medicaid Other

## 2021-07-28 ENCOUNTER — Other Ambulatory Visit: Payer: Self-pay

## 2021-07-28 ENCOUNTER — Inpatient Hospital Stay (HOSPITAL_COMMUNITY)
Admission: AD | Admit: 2021-07-28 | Discharge: 2021-07-28 | Disposition: A | Discharge status: Home / Self Care | Payer: Medicaid Other | Attending: Family Medicine | Admitting: Family Medicine

## 2021-07-28 VITALS — BP 116/79 | HR 77 | Wt 170.0 lb

## 2021-07-28 DIAGNOSIS — Z885 Allergy status to narcotic agent status: Secondary | ICD-10-CM | POA: Diagnosis not present

## 2021-07-28 DIAGNOSIS — Z881 Allergy status to other antibiotic agents status: Secondary | ICD-10-CM | POA: Diagnosis not present

## 2021-07-28 DIAGNOSIS — Z3A01 Less than 8 weeks gestation of pregnancy: Secondary | ICD-10-CM | POA: Insufficient documentation

## 2021-07-28 DIAGNOSIS — O26891 Other specified pregnancy related conditions, first trimester: Secondary | ICD-10-CM | POA: Diagnosis not present

## 2021-07-28 DIAGNOSIS — O26899 Other specified pregnancy related conditions, unspecified trimester: Secondary | ICD-10-CM

## 2021-07-28 DIAGNOSIS — Z9221 Personal history of antineoplastic chemotherapy: Secondary | ICD-10-CM

## 2021-07-28 DIAGNOSIS — O0281 Inappropriate change in quantitative human chorionic gonadotropin (hCG) in early pregnancy: Secondary | ICD-10-CM

## 2021-07-28 DIAGNOSIS — R109 Unspecified abdominal pain: Secondary | ICD-10-CM | POA: Diagnosis present

## 2021-07-28 DIAGNOSIS — O009 Unspecified ectopic pregnancy without intrauterine pregnancy: Secondary | ICD-10-CM | POA: Diagnosis not present

## 2021-07-28 DIAGNOSIS — R1032 Left lower quadrant pain: Secondary | ICD-10-CM | POA: Diagnosis not present

## 2021-07-28 LAB — URINALYSIS, ROUTINE W REFLEX MICROSCOPIC
Bilirubin Urine: NEGATIVE
Glucose, UA: NEGATIVE mg/dL
Ketones, ur: 5 mg/dL — AB
Leukocytes,Ua: NEGATIVE
Nitrite: NEGATIVE
Protein, ur: NEGATIVE mg/dL
RBC / HPF: >50 RBC/hpf — ABNORMAL HIGH (ref 0–5)
Specific Gravity, Urine: 1.009 (ref 1.005–1.030)
pH: 6 (ref 5.0–8.0)

## 2021-07-28 LAB — CBC
HCT: 39.1 % (ref 36.0–46.0)
Hemoglobin: 13.1 g/dL (ref 12.0–15.0)
MCH: 30.5 pg (ref 26.0–34.0)
MCHC: 33.5 g/dL (ref 30.0–36.0)
MCV: 91.1 fL (ref 80.0–100.0)
Platelets: 218 10*3/uL (ref 150–400)
RBC: 4.29 MIL/uL (ref 3.87–5.11)
RDW: 13.1 % (ref 11.5–15.5)
WBC: 6.1 10*3/uL (ref 4.0–10.5)
nRBC: 0 % (ref 0.0–0.2)

## 2021-07-28 LAB — COMPREHENSIVE METABOLIC PANEL
ALT: 54 U/L — ABNORMAL HIGH (ref 0–44)
AST: 35 U/L (ref 15–41)
Albumin: 4 g/dL (ref 3.5–5.0)
Alkaline Phosphatase: 57 U/L (ref 38–126)
Anion gap: 8 (ref 5–15)
BUN: 5 mg/dL — ABNORMAL LOW (ref 6–20)
CO2: 25 mmol/L (ref 22–32)
Calcium: 9.1 mg/dL (ref 8.9–10.3)
Chloride: 108 mmol/L (ref 98–111)
Creatinine, Ser: 0.73 mg/dL (ref 0.44–1.00)
GFR, Estimated: >60 mL/min (ref >60)
Glucose, Bld: 93 mg/dL (ref 70–99)
Potassium: 4 mmol/L (ref 3.5–5.1)
Sodium: 141 mmol/L (ref 135–145)
Total Bilirubin: 0.4 mg/dL (ref 0.3–1.2)
Total Protein: 6.6 g/dL (ref 6.5–8.1)

## 2021-07-28 LAB — HCG, QUANTITATIVE, PREGNANCY: hCG, Beta Chain, Quant, S: 262 m[IU]/mL — ABNORMAL HIGH (ref <5)

## 2021-07-28 MED ORDER — ONDANSETRON 4 MG PO TBDP
8.0000 mg | ORAL_TABLET | Freq: Once | ORAL | Status: AC
Start: 2021-07-28 — End: 2021-07-28
  Administered 2021-07-28: 8 mg via ORAL
  Filled 2021-07-28: qty 2

## 2021-07-28 MED ORDER — IBUPROFEN 600 MG PO TABS: 600.0000 mg | ORAL_TABLET | Freq: Four times a day (QID) | ORAL | 0 refills | Status: DC | PRN

## 2021-07-28 MED ORDER — HYDROMORPHONE HCL 2 MG PO TABS
2.0000 mg | ORAL_TABLET | Freq: Once | ORAL | Status: AC
  Administered 2021-07-28: 2 mg via ORAL
  Filled 2021-07-28 (×2): qty 1

## 2021-07-28 MED ORDER — FENTANYL CITRATE (PF) 100 MCG/2ML IJ SOLN
100.0000 ug | Freq: Once | INTRAMUSCULAR | Status: AC
  Administered 2021-07-28: 100 ug via INTRAMUSCULAR
  Filled 2021-07-28: qty 2

## 2021-07-28 MED ORDER — ONDANSETRON HCL 4 MG PO TABS
4.0000 mg | ORAL_TABLET | Freq: Once | ORAL | 0 refills | Status: AC
Start: 2021-07-28 — End: 2021-07-28

## 2021-07-28 MED ORDER — METHOTREXATE FOR ECTOPIC PREGNANCY
50.0000 mg/m2 | Freq: Once | INTRAMUSCULAR | Status: AC
Start: 2021-07-28 — End: 2021-07-28
  Administered 2021-07-28: 95 mg via INTRAMUSCULAR
  Filled 2021-07-28: qty 10

## 2021-07-28 NOTE — MAU Provider Note (Signed)
?History  ?  ? ?CSN: [id number] ? ?Arrival date and time: 07/28/21 1108 ? ? Event Date/Time  ? First Provider Initiated Contact with Patient 07/28/21 1447   ?  ? ?Chief Complaint  ?Patient presents with  ? Abdominal Pain  ? ?Felicia Davila is a 31 y.o. [id number] at 79w6dby LMP.  She presents today for Abdominal Pain.  The is an ongoing problem that has been present for approximately 2 weeks.   Patient reports today pain is worse and continuous with radiation to her lower back.  Patient rates pain a 10/10. Patient has been seen at our facility and has had repeat quant's with initial 203 and repeat at 48 hours to 295.  She states she also has nausea and vomiting and questions if it is related to the pain or the pregnancy. ? ? ?OB History   ? ? Gravida  ?3  ? Para  ?2  ? Term  ?1  ? Preterm  ?1  ? AB  ?   ? Living  ?2  ?  ? ? SAB  ?   ? IAB  ?   ? Ectopic  ?   ? Multiple  ?   ? Live Births  ?2  ?   ?  ?  ? ? ?Past Medical History:  ?Diagnosis Date  ? Anxiety   ? Depression   ? [place] ([place]   ? ? ?Past Surgical History:  ?Procedure Laterality Date  ? CESAREAN SECTION    ? CHOLECYSTECTOMY    ? FOREARM SURGERY    ? ? ?Family History  ?Problem Relation Age of Onset  ? Lupus Father   ? ? ?Social History  ? ?Tobacco Use  ? Smoking status: Former  ? Smokeless tobacco: Never  ?Substance Use Topics  ? Alcohol use: No  ? Drug use: No  ? ? ?Allergies:  ?Allergies  ?Allergen Reactions  ? Morphine And Related Anaphylaxis, Rash and Other (See Comments)  ?  Per extensive conversation with patient, surgical attending and surgical PA, patient does NOT have an anaphylactic reaction to morphine. Has taken for short periods in the past without issues. Pins and needle feeling in extremities. (??) ? ?  ? Nitrofurantoin Nausea And Vomiting and Other (See Comments)  ?  Also, "felt "weird in the head" ?  ? Hydrocodone Other (See Comments)  ?  Pins and needle feeling in extremities  ? ? ?Medications Prior to Admission  ?Medication Sig Dispense  Refill Last Dose  ? cyclobenzaprine (FLEXERIL) 10 MG tablet Take 1 tablet (10 mg total) by mouth 2 (two) times daily as needed for muscle spasms. 20 tablet 0 Past Month  ? ondansetron (ZOFRAN) 4 MG tablet Take 1 tablet (4 mg total) by mouth every 6 (six) hours. 12 tablet 0 07/28/2021  ? ? ?Review of Systems ?Physical Exam  ? ?Blood pressure 126/86, pulse 97, temperature 98.3 ?F (36.8 ?C), temperature source Oral, resp. rate 20, height '5\' 7"'$  (1.702 m), weight 77 kg, last menstrual period 06/17/2021, SpO2 99 %. ? ?Physical Exam ?Vitals reviewed. Exam conducted with a chaperone present.  ?Constitutional:   ?   Appearance: Normal appearance. She is well-developed.  ?HENT:  ?   Head: Normocephalic and atraumatic.  ?Eyes:  ?   Conjunctiva/sclera: Conjunctivae normal.  ?Cardiovascular:  ?   Rate and Rhythm: Normal rate.  ?Pulmonary:  ?   Effort: Pulmonary effort is normal. No respiratory distress.  ?Abdominal:  ?   General: Abdomen is flat.  ?  Tenderness: There is abdominal tenderness in the left lower quadrant. There is guarding. There is no rebound.  ?Musculoskeletal:  ?   Cervical back: Normal range of motion.  ?Skin: ?   General: Skin is warm and dry.  ?Neurological:  ?   Mental Status: She is alert and oriented to person, place, and time.  ?Psychiatric:     ?   Mood and Affect: Mood normal.     ?   Behavior: Behavior normal.  ? ? ?MAU Course  ?Procedures ?Results for orders placed or performed during the hospital encounter of 07/28/21 (from the past 24 hour(s))  ?Comprehensive metabolic panel     Status: Abnormal  ? Collection Time: 07/28/21 11:45 AM  ?Result Value Ref Range  ? Sodium 141 135 - 145 mmol/L  ? Potassium 4.0 3.5 - 5.1 mmol/L  ? Chloride 108 98 - 111 mmol/L  ? CO2 25 22 - 32 mmol/L  ? Glucose, Bld 93 70 - 99 mg/dL  ? BUN 5 (L) 6 - 20 mg/dL  ? Creatinine, Ser 0.73 0.44 - 1.00 mg/dL  ? Calcium 9.1 8.9 - 10.3 mg/dL  ? Total Protein 6.6 6.5 - 8.1 g/dL  ? Albumin 4.0 3.5 - 5.0 g/dL  ? AST 35 15 - 41 U/L  ? ALT  54 (H) 0 - 44 U/L  ? Alkaline Phosphatase 57 38 - 126 U/L  ? Total Bilirubin 0.4 0.3 - 1.2 mg/dL  ? GFR, Estimated >60 >60 mL/min  ? Anion gap 8 5 - 15  ?CBC     Status: None  ? Collection Time: 07/28/21 11:45 AM  ?Result Value Ref Range  ? WBC 6.1 4.0 - 10.5 K/uL  ? RBC 4.29 3.87 - 5.11 MIL/uL  ? Hemoglobin 13.1 12.0 - 15.0 g/dL  ? HCT 39.1 36.0 - 46.0 %  ? MCV 91.1 80.0 - 100.0 fL  ? MCH 30.5 26.0 - 34.0 pg  ? MCHC 33.5 30.0 - 36.0 g/dL  ? RDW 13.1 11.5 - 15.5 %  ? Platelets 218 150 - 400 K/uL  ? nRBC 0.0 0.0 - 0.2 %  ?hCG, quantitative, pregnancy     Status: Abnormal  ? Collection Time: 07/28/21 11:45 AM  ?Result Value Ref Range  ? hCG, Beta Chain, Quant, S 262 (H) <5 mIU/mL  ?Urinalysis, Routine w reflex microscopic Urine, Clean Catch     Status: Abnormal  ? Collection Time: 07/28/21  1:30 PM  ?Result Value Ref Range  ? Color, Urine YELLOW YELLOW  ? APPearance HAZY (A) CLEAR  ? Specific Gravity, Urine 1.009 1.005 - 1.030  ? pH 6.0 5.0 - 8.0  ? Glucose, UA NEGATIVE NEGATIVE mg/dL  ? Hgb urine dipstick LARGE (A) NEGATIVE  ? Bilirubin Urine NEGATIVE NEGATIVE  ? Ketones, ur 5 (A) NEGATIVE mg/dL  ? Protein, ur NEGATIVE NEGATIVE mg/dL  ? Nitrite NEGATIVE NEGATIVE  ? Leukocytes,Ua NEGATIVE NEGATIVE  ? RBC / HPF >50 (H) 0 - 5 RBC/hpf  ? WBC, UA 0-5 0 - 5 WBC/hpf  ? Bacteria, UA FEW (A) NONE SEEN  ? Squamous Epithelial / LPF 11-20 0 - 5  ? Mucus PRESENT   ? ? ?MDM ?Labs: CMP, CBC, hCG, ?Ultrasound ?Assessment and Plan  ?31 year old  ?[id number] at 5.6 weeks ?Left Side Abdominal Pain ?Abnormal rise in hCG ? ?-Labs completed and provider to bedside to review. ?-Informed of provider concern with continued and worsening pain.  ?-Briefly discussed concern for ectopic pregnancy and treatment with MTX. ?-However,  will await [place] results r/o potential rupture or other causes of LLQ pain.  ?-Discussed treatment of nausea with oral medication. Patient agreeable. ?-Discussed management of pain with Fentanyl.  Patient agreeable. ?-[place]  results pending. ?-Plan to consult with MD once results final.  ? ?Maryann Conners ?07/28/2021, 2:08 PM  ? ?Reassessment (3:54 PM) ?-US shows no IUP or ectopic pregnancy. ?- T. Kennon Rounds, MD consulted and informed of patient status, evaluation, interventions, results, and recommendation of offering MTX dosing. Advised: ?*Agreeable with recommendation. ?-Provider to bedside. ?-Patient reports pain unmanaged with Fentanyl dosing. ?-Discussed usage of oral dilaudid. Patient agreeable. ?-Reviewed MTX recommendation, risks, and benefits and follow up as below. ?-Addressed questions regarding follow up, management of pain, and management of potential side effects.  ?-Patient declines time to think about decision and would like to proceed. ?-Patient questions if she can follow up with this provider in office as appropriate.  Absolutely!! ?-Informed that follow up will occur ~ 2 weeks after confirmed downward trend. ?-MTX ordered. ?-Encouraged to call primary office or return to MAU if symptoms worsen or with the onset of new symptoms. ?-Plan to return to MAU on Friday April 7th at 2pm for repeat labs. Scheduled per protocol. ?-Rx for Zofran and Ibuprofen sent to pharmacy on file. ?-Encouraged to return to MAU if symptoms worsen or with the onset of new symptoms. ?-Discharged to home in stable condition. ? ? ?The risks of methotrexate were reviewed including failure requiring repeat dosing or eventual surgery. She understands that methotrexate involves frequent return visits to monitor lab values and that she remains at risk of ectopic rupture until her beta is less than assay. ?The patient opts to proceed with methotrexate.  She has no history of hepatic or renal dysfunction, has normal BUN/Cr/platelets. AST normal, but ALT slightly elevated at 54, but not concerning for abnormal function.  She is felt to be reliable for follow-up. Side effects of photosensitivity & GI upset were discussed.  She knows to avoid direct sunlight and  abstain from alcohol and sexual intercourse for two weeks. She was counseled to discontinue any MVI with folic acid. ? ??She understands to follow up on D4 (Tuesday) and D7 (Monday) for repeat BHCG and w

## 2021-07-28 NOTE — Progress Notes (Deleted)
Beta HCG Follow-up Visit ? ?Felicia Davila presents to [facility] for follow-up beta HCG lab. She was seen in MAU for LLQ abdominal pain and dark brown discharge on 07/23/21. Patient reports LLQ abdominal pain and and vaginal spotting today. Discussed with patient that we are following beta HCG levels today. Results will be back in approximately 2 hours. Valid contact number for patient confirmed. I will call the patient with results.  ? ?Beta HCG results: ? 07/23/21  203  ? 07/26/21  295  ? 07/28/21   ? ?Results and patient history reviewed with ***, who states ***. Patient called and informed of plan for follow-up. ? ?[name] ?07/28/2021  ?

## 2021-07-28 NOTE — Progress Notes (Signed)
Patient here today following up from MAU visits on 07/23/21 and 07/25/21 for LLQ abdominal pain and dark brown discharge. Per patient her pain was a 6/10 while at MAU. Patient states her pain has gotten much worse and feels "like a baseball wrapped in barbed wire" in her LLQ of her abdomen. Patient rates the pain a 8/10. Patient states she tried taking 1000 mg of Tylenol earlier this morning with no change in the pain. Patient states her dark brown discharge is now bright red vaginal spotting. I reviewed this with Dr. Higinio Plan. Per Dr. Higinio Plan patient will need another hCG level drawn and ultrasound done. Per MFM, patient will not be able to get an ultrasound today until 3:45 PM. I reviewed this with Dr. Higinio Plan. Per Dr. Higinio Plan patient should go to MAU for evaluation. I reviewed this with the patient. Patient verbalized understanding and denies any other questions.  ? ?Paulina Fusi, RN ?07/28/21 ?

## 2021-07-28 NOTE — MAU Note (Signed)
Felicia Davila is a 31 y.o. at 59w6dhere in MAU reporting: left lower abdominal pain that has worsened since Saturday.  Reports spotting x2 weeks with wiping.  Denies passing clots ? ?Onset of complaint: Saturday ?Pain score: 9 ?Vitals:  ? 07/28/21 1255  ?BP: 126/86  ?Pulse: 97  ?Resp: 20  ?Temp: 98.3 ?F (36.8 ?C)  ?SpO2: 99%  ?   ?FHT:N/A ?Lab orders placed from triage:   UA ?

## 2021-07-31 ENCOUNTER — Other Ambulatory Visit: Payer: Self-pay

## 2021-07-31 ENCOUNTER — Inpatient Hospital Stay (HOSPITAL_COMMUNITY): Payer: Medicaid Other

## 2021-07-31 ENCOUNTER — Other Ambulatory Visit (HOSPITAL_COMMUNITY): Payer: MEDICAID

## 2021-07-31 ENCOUNTER — Encounter (HOSPITAL_COMMUNITY): Payer: Self-pay | Admitting: Obstetrics & Gynecology

## 2021-07-31 ENCOUNTER — Inpatient Hospital Stay (HOSPITAL_COMMUNITY)
Admission: AD | Admit: 2021-07-31 | Discharge: 2021-07-31 | Disposition: A | Discharge status: Home / Self Care | Payer: Medicaid Other | Attending: Obstetrics & Gynecology | Admitting: Obstetrics & Gynecology

## 2021-07-31 DIAGNOSIS — Z79899 Other long term (current) drug therapy: Secondary | ICD-10-CM | POA: Insufficient documentation

## 2021-07-31 DIAGNOSIS — O00102 Left tubal pregnancy without intrauterine pregnancy: Secondary | ICD-10-CM | POA: Insufficient documentation

## 2021-07-31 DIAGNOSIS — R102 Pelvic and perineal pain: Secondary | ICD-10-CM | POA: Diagnosis not present

## 2021-07-31 DIAGNOSIS — Z9221 Personal history of antineoplastic chemotherapy: Secondary | ICD-10-CM

## 2021-07-31 DIAGNOSIS — Z79631 Long term (current) use of antimetabolite agent: Secondary | ICD-10-CM

## 2021-07-31 LAB — HCG, QUANTITATIVE, PREGNANCY: hCG, Beta Chain, Quant, S: 349 m[IU]/mL — ABNORMAL HIGH (ref <5)

## 2021-07-31 MED ORDER — LACTATED RINGERS IV BOLUS
1000.0000 mL | Freq: Once | INTRAVENOUS | Status: AC
Start: 2021-07-31 — End: 2021-07-31
  Administered 2021-07-31: 1000 mL via INTRAVENOUS

## 2021-07-31 MED ORDER — OXYCODONE-ACETAMINOPHEN 5-325 MG PO TABS
2.0000 | ORAL_TABLET | Freq: Once | ORAL | Status: AC
  Administered 2021-07-31: 2 via ORAL
  Filled 2021-07-31: qty 2

## 2021-07-31 MED ORDER — FENTANYL CITRATE (PF) 100 MCG/2ML IJ SOLN
50.0000 ug | Freq: Once | INTRAMUSCULAR | Status: AC
  Administered 2021-07-31: 50 ug via INTRAVENOUS
  Filled 2021-07-31: qty 2

## 2021-07-31 MED ORDER — FENTANYL CITRATE (PF) 100 MCG/2ML IJ SOLN: 50.0000 ug | Freq: Once | INTRAMUSCULAR | Status: DC

## 2021-07-31 MED ORDER — OXYCODONE-ACETAMINOPHEN 5-325 MG PO TABS: 1.0000 | ORAL_TABLET | Freq: Four times a day (QID) | ORAL | 0 refills | Status: DC | PRN

## 2021-07-31 NOTE — MAU Note (Signed)
Felicia Davila is a 31 y.o. at 53w2dhere in MAU reporting: pain in left lower abdomen that began yesterday.  Pt s/p MTX on Tuesday afternoon.  Endorses spotting. ? ?Onset of complaint: yesterday ?Pain score: 10/10 ?Vitals:  ? 07/31/21 1156  ?BP: 112/71  ?Pulse: (!) 101  ?Resp: 19  ?Temp: 98.7 ?F (37.1 ?C)  ?SpO2: 100%  ?   ?FHT:N/A ?Lab orders placed from triage:   None ?

## 2021-07-31 NOTE — MAU Provider Note (Signed)
?History  ?  ? ?CSN: [id number] ? ?Arrival date and time: 07/31/21 1131 ? ? Event Date/Time  ? First Provider Initiated Contact with Patient 07/31/21 1257   ?  ? ?Chief Complaint  ?Patient presents with  ? Abdominal Pain  ? ?Felicia Davila is a 31 y.o. [id number] at 64w2dwho presents today for day #4 S/P MTX. She states that now she is having worsening pain from when she was here on Tuesday. She states that since waking up this morning she has had 10/10 pain in her LLQ that radiates down her leg. She has tried tylenol and ibuprofen, but it has not helped. She states that the pain is constant, but sometimes a little less. She reports that she last ate at 0930 today.  ? ?Pelvic Pain ?The patient's primary symptoms include pelvic pain. This is a new problem. The current episode started today. The problem occurs constantly. The problem has been gradually worsening. The pain is severe. The problem affects the left side. She is pregnant. There has been no bleeding. Nothing aggravates the symptoms. She has tried acetaminophen and NSAIDs for the symptoms. The treatment provided no relief.  ? ?OB History   ? ? Gravida  ?3  ? Para  ?2  ? Term  ?1  ? Preterm  ?1  ? AB  ?   ? Living  ?2  ?  ? ? SAB  ?   ? IAB  ?   ? Ectopic  ?   ? Multiple  ?   ? Live Births  ?2  ?   ?  ?  ? ? ?Past Medical History:  ?Diagnosis Date  ? Anxiety   ? Depression   ? Lupus ([place]   ? ? ?Past Surgical History:  ?Procedure Laterality Date  ? CESAREAN SECTION    ? CHOLECYSTECTOMY    ? FOREARM SURGERY    ? ? ?Family History  ?Problem Relation Age of Onset  ? Healthy Mother   ? Lupus Father   ? ? ?Social History  ? ?Tobacco Use  ? Smoking status: Former  ? Smokeless tobacco: Never  ?Vaping Use  ? Vaping Use: Former  ?Substance Use Topics  ? Alcohol use: No  ? Drug use: No  ? ? ?Allergies:  ?Allergies  ?Allergen Reactions  ? Morphine And Related Anaphylaxis, Rash and Other (See Comments)  ?  Per extensive conversation with patient, surgical attending and  surgical PA, patient does NOT have an anaphylactic reaction to morphine. Has taken for short periods in the past without issues. Pins and needle feeling in extremities. (??) ? ?  ? Nitrofurantoin Nausea And Vomiting and Other (See Comments)  ?  Also, "felt "weird in the head" ?  ? Hydrocodone Other (See Comments)  ?  Pins and needle feeling in extremities  ? ? ?Medications Prior to Admission  ?Medication Sig Dispense Refill Last Dose  ? ibuprofen (ADVIL) 600 MG tablet Take 1 tablet (600 mg total) by mouth every 6 (six) hours as needed. 14 tablet 0 07/31/2021 at 0900  ? ondansetron (ZOFRAN) 4 MG tablet Take 4 mg by mouth every 8 (eight) hours as needed for nausea or vomiting.   07/31/2021 at 0900  ? cyclobenzaprine (FLEXERIL) 10 MG tablet Take 1 tablet (10 mg total) by mouth 2 (two) times daily as needed for muscle spasms. 20 tablet 0   ? ? ?Review of Systems  ?Genitourinary:  Positive for pelvic pain.  ?All other systems reviewed and are negative. ?  Physical Exam  ? ?Blood pressure 112/71, pulse (!) 101, temperature 98.7 ?F (37.1 ?C), temperature source Oral, resp. rate 19, height '5\' 7"'$  (1.702 m), weight 77.9 kg, last menstrual period 06/17/2021, SpO2 100 %. ? ?Physical Exam ?Constitutional:   ?   Appearance: She is well-developed.  ?HENT:  ?   Head: Normocephalic.  ?Eyes:  ?   Pupils: Pupils are equal, round, and reactive to light.  ?Cardiovascular:  ?   Rate and Rhythm: Normal rate and regular rhythm.  ?   Heart sounds: Normal heart sounds.  ?Pulmonary:  ?   Effort: Pulmonary effort is normal. No respiratory distress.  ?   Breath sounds: Normal breath sounds.  ?Abdominal:  ?   Palpations: Abdomen is soft.  ?   Tenderness: There is abdominal tenderness (LLQ). There is guarding.  ?Genitourinary: ?   Vagina: No bleeding. Vaginal discharge: mucusy. ?   Comments:  ? ? ?Musculoskeletal:     ?   General: Normal range of motion.  ?   Cervical back: Normal range of motion and neck supple.  ?Skin: ?   General: Skin is warm and  dry.  ?Neurological:  ?   Mental Status: She is alert and oriented to person, place, and time.  ?Psychiatric:     ?   Mood and Affect: Mood normal.     ?   Behavior: Behavior normal.  ? ? ?Results for orders placed or performed during the hospital encounter of 07/31/21 (from the past 24 hour(s))  ?hCG, quantitative, pregnancy     Status: Abnormal  ? Collection Time: 07/31/21 12:01 PM  ?Result Value Ref Range  ? hCG, Beta Chain, Quant, S 349 (H) <5 mIU/mL  ? ?US OB Transvaginal ? ?Result Date: 07/31/2021 ?CLINICAL DATA:  Status post methotrexate therapy for presumed ectopic pregnancy. EXAM: TRANSVAGINAL OB ULTRASOUND TECHNIQUE: Transvaginal ultrasound was performed for complete evaluation of the gestation as well as the maternal uterus, adnexal regions, and pelvic cul-de-sac. COMPARISON:  Ultrasound of July 28, 2021. FINDINGS: Intrauterine gestational sac: None Yolk sac:  Not Visualized. Embryo:  Not Visualized. Cardiac Activity: Not Visualized. Subchorionic hemorrhage:  None visualized. Maternal uterus/adnexae: Right ovary is unremarkable. No free fluid is noted. Left ovary is unremarkable. Possible complex rounded area is seen adjacent to the left ovary measuring 1.7 x 1.4 x 1.2 cm. It is uncertain if this represents possible ectopic pregnancy, or other possible normal structure. IMPRESSION: No evidence of intrauterine gestation is noted. 1.7 x 1.4 x 1.2 cm complex rounded area is seen adjacent to the left ovary which potentially may represent ectopic pregnancy, but this cannot be said definitively. Critical Value/emergent results were called by telephone at the time of interpretation on 07/31/2021 at 3:23 pm to provider [facility] , who verbally acknowledged these results. Electronically Signed   By: Marijo Conception M.D.   On: 07/31/2021 15:23   ? ? ?MAU Course  ?Procedures ? ?MDM ?HCG on day #1 262 ?HCG on day #4 349 ?No evidence of rupture on [place] today ?1548: DW Dr. Harolyn Rutherford, reviewed [place] results, no emergent need  at this time. Will DC home with pain medication and for her to FU in the clinic on day #7 on Monday 4/10 for HCG. Appt made for stat hcg in the clinic.  ? ? ?Assessment and Plan  ? ?1. Left tubal pregnancy without intrauterine pregnancy   ?2. Encounter for methotrexate monitoring   ?3. Prior methotrexate therapy   ? ?DC home ?Bleeding precautions ?Ectopic  precautions ?RX: percocet 3/325 PRN #5 tabs  ?Return to MAU as needed ?FU with OB as planned ? ? Follow-up Information   ? ? [facility] Follow up.   ?Specialty: Obstetrics and Gynecology ?Why: Monday 08/03/2021 at 9:00am for repeat blood work ?Contact information: ?[address] ?[place] [address] ?[phone] ? ?  ?  ? ?  ?  ? ?  ? ?Marcille Buffy DNP, CNM  ?07/31/21  3:56 PM  ? ? ?

## 2021-08-03 ENCOUNTER — Ambulatory Visit (INDEPENDENT_AMBULATORY_CARE_PROVIDER_SITE_OTHER): Payer: Self-pay

## 2021-08-03 VITALS — BP 125/84 | HR 88 | Wt 173.8 lb

## 2021-08-03 DIAGNOSIS — R109 Unspecified abdominal pain: Secondary | ICD-10-CM

## 2021-08-03 DIAGNOSIS — Z79631 Long term (current) use of antimetabolite agent: Secondary | ICD-10-CM

## 2021-08-03 DIAGNOSIS — Z5181 Encounter for therapeutic drug level monitoring: Secondary | ICD-10-CM

## 2021-08-03 LAB — BETA HCG QUANT (REF LAB): hCG Quant: 52 m[IU]/mL

## 2021-08-03 MED ORDER — OXYCODONE-ACETAMINOPHEN 5-325 MG PO TABS: 1.0000 | ORAL_TABLET | Freq: Four times a day (QID) | ORAL | 0 refills | Status: DC | PRN

## 2021-08-03 NOTE — Progress Notes (Signed)
Patient here today following up from MAU visit on 07/28/21. Patient is here for a stat beta hCG day #7 post methotrexate. She states her pain has improved since her MAU visit and is now a 8/10 in her LLQ. Patient takes Tylenol and Ibuprofen but states it has not helped. I reviewed this with Dr. Kennon Rounds. Prescription for percocet sent to patient's pharmacy. Patient states her spotting has increased to period-like bleeding. I reviewed patient's phone number with her and informed her I would call her today when the results of her blood work came back. Patient denies any other questions or concerns.  ? ?Patient's hCG today is 52, on 07/31/21 hCG was 349. Reviewed this with Dr. Rip Harbour, per Dr. Rip Harbour this is a reassuring result and patient to return to clinic in a week for a non-stat beta hCG. I attempted to call the patient 3 times today to review this with her. I left a voicemail for the patient asking her to call the clinic back. Message sent to patient over mychart to notify her of results and of her upcoming appointment.  ? ? ?Paulina Fusi, RN ?08/03/21 ? ?

## 2021-08-10 ENCOUNTER — Other Ambulatory Visit: Payer: Self-pay

## 2021-08-10 ENCOUNTER — Other Ambulatory Visit: Payer: Medicaid Other

## 2021-08-10 DIAGNOSIS — O00102 Left tubal pregnancy without intrauterine pregnancy: Secondary | ICD-10-CM

## 2021-08-11 LAB — BETA HCG QUANT (REF LAB): hCG Quant: 3 m[IU]/mL

## 2021-08-20 ENCOUNTER — Ambulatory Visit: Payer: Medicaid Other

## 2021-08-31 ENCOUNTER — Ambulatory Visit: Payer: Medicaid Other

## 2021-10-03 ENCOUNTER — Inpatient Hospital Stay (HOSPITAL_COMMUNITY)
Admission: AD | Admit: 2021-10-03 | Discharge: 2021-10-03 | Disposition: A | Discharge status: Home / Self Care | Payer: Medicaid Other | Attending: Obstetrics and Gynecology | Admitting: Obstetrics and Gynecology

## 2021-10-03 ENCOUNTER — Encounter (HOSPITAL_COMMUNITY): Payer: Self-pay

## 2021-10-03 ENCOUNTER — Inpatient Hospital Stay (HOSPITAL_COMMUNITY): Payer: Medicaid Other

## 2021-10-03 DIAGNOSIS — Z3491 Encounter for supervision of normal pregnancy, unspecified, first trimester: Secondary | ICD-10-CM

## 2021-10-03 DIAGNOSIS — O26891 Other specified pregnancy related conditions, first trimester: Secondary | ICD-10-CM | POA: Diagnosis not present

## 2021-10-03 DIAGNOSIS — Z9221 Personal history of antineoplastic chemotherapy: Secondary | ICD-10-CM

## 2021-10-03 DIAGNOSIS — Z3A01 Less than 8 weeks gestation of pregnancy: Secondary | ICD-10-CM | POA: Diagnosis not present

## 2021-10-03 LAB — COMPREHENSIVE METABOLIC PANEL
ALT: 297 U/L — ABNORMAL HIGH (ref 0–44)
AST: 130 U/L — ABNORMAL HIGH (ref 15–41)
Albumin: 3.9 g/dL (ref 3.5–5.0)
Alkaline Phosphatase: 61 U/L (ref 38–126)
Anion gap: 12 (ref 5–15)
BUN: 9 mg/dL (ref 6–20)
CO2: 22 mmol/L (ref 22–32)
Calcium: 8.9 mg/dL (ref 8.9–10.3)
Chloride: 101 mmol/L (ref 98–111)
Creatinine, Ser: 0.7 mg/dL (ref 0.44–1.00)
GFR, Estimated: >60 mL/min (ref >60)
Glucose, Bld: 93 mg/dL (ref 70–99)
Potassium: 4.1 mmol/L (ref 3.5–5.1)
Sodium: 135 mmol/L (ref 135–145)
Total Bilirubin: 0.7 mg/dL (ref 0.3–1.2)
Total Protein: 6.6 g/dL (ref 6.5–8.1)

## 2021-10-03 LAB — URINALYSIS, ROUTINE W REFLEX MICROSCOPIC
Bilirubin Urine: NEGATIVE
Glucose, UA: NEGATIVE mg/dL
Hgb urine dipstick: NEGATIVE
Ketones, ur: 5 mg/dL — AB
Leukocytes,Ua: NEGATIVE
Nitrite: NEGATIVE
Protein, ur: NEGATIVE mg/dL
Specific Gravity, Urine: 1.012 (ref 1.005–1.030)
pH: 8 (ref 5.0–8.0)

## 2021-10-03 LAB — POCT PREGNANCY, URINE: Preg Test, Ur: POSITIVE — AB

## 2021-10-03 LAB — CBC
HCT: 39 % (ref 36.0–46.0)
Hemoglobin: 12.8 g/dL (ref 12.0–15.0)
MCH: 30.5 pg (ref 26.0–34.0)
MCHC: 32.8 g/dL (ref 30.0–36.0)
MCV: 93.1 fL (ref 80.0–100.0)
Platelets: 200 10*3/uL (ref 150–400)
RBC: 4.19 MIL/uL (ref 3.87–5.11)
RDW: 12.8 % (ref 11.5–15.5)
WBC: 4.4 10*3/uL (ref 4.0–10.5)
nRBC: 0 % (ref 0.0–0.2)

## 2021-10-03 LAB — HCG, QUANTITATIVE, PREGNANCY: hCG, Beta Chain, Quant, S: 5512 m[IU]/mL — ABNORMAL HIGH (ref <5)

## 2021-10-03 NOTE — MAU Provider Note (Signed)
History     CSN: [id number]  Arrival date and time: 10/03/21 1144   Event Date/Time   First Provider Initiated Contact with Patient 10/03/21 1215      Chief Complaint  Patient presents with   Abdominal Pain   HPI  Felicia Davila is a 31 y.o. [id number] at 40w5dwho presents for evaluation of abdominal pain. Patient reports for the past 2-3 weeks she has had worsening lower abdominal pain. Patient rates the pain as a 7/10 and has tried tylenol and ibuprofen for the pain with some relief.  She denies any vaginal bleeding and discharge. Denies any constipation, diarrhea or any urinary complaints.   She was treated for an ectopic pregnancy with Methotrexate in April and feels like this pain is similar. She reports she has been having unprotected intercourse but tracking her ovulation so she felt like she was being careful. She is concerned about the impact of recent MTX on this pregnancy  OB History     Gravida  4   Para  2   Term  1   Preterm  1   AB  1   Living  2      SAB      IAB      Ectopic  1   Multiple      Live Births  2           Past Medical History:  Diagnosis Date   Anxiety    Depression    [place]     Past Surgical History:  Procedure Laterality Date   CESAREAN SECTION     CHOLECYSTECTOMY     FOREARM SURGERY      Family History  Problem Relation Age of Onset   Healthy Mother    Lupus Father     Social History   Tobacco Use   Smoking status: Former   Smokeless tobacco: Never  VScientific laboratory technicianUse: Former  Substance Use Topics   Alcohol use: No   Drug use: No    Allergies:  Allergies  Allergen Reactions   Morphine And Related Anaphylaxis, Rash and Other (See Comments)    Per extensive conversation with patient, surgical attending and surgical PA, patient does NOT have an anaphylactic reaction to morphine. Has taken for short periods in the past without issues. Pins and needle feeling in extremities. (??)      Nitrofurantoin Nausea And Vomiting and Other (See Comments)    Also, "felt "weird in the head"    Hydrocodone Other (See Comments)    Pins and needle feeling in extremities    Medications Prior to Admission  Medication Sig Dispense Refill Last Dose   acetaminophen (TYLENOL) 325 MG tablet Take 650 mg by mouth every 6 (six) hours as needed.   Past Month   cyclobenzaprine (FLEXERIL) 10 MG tablet Take 1 tablet (10 mg total) by mouth 2 (two) times daily as needed for muscle spasms. 20 tablet 0    ibuprofen (ADVIL) 600 MG tablet Take 1 tablet (600 mg total) by mouth every 6 (six) hours as needed. 14 tablet 0    ondansetron (ZOFRAN) 4 MG tablet Take 4 mg by mouth every 8 (eight) hours as needed for nausea or vomiting.      oxyCODONE-acetaminophen (PERCOCET/ROXICET) 5-325 MG tablet Take 1 tablet by mouth every 6 (six) hours as needed for severe pain. 8 tablet 0     Review of Systems  Constitutional: Negative.  Negative for fatigue and fever.  HENT: Negative.    Respiratory: Negative.  Negative for shortness of breath.   Cardiovascular: Negative.  Negative for chest pain.  Gastrointestinal:  Positive for abdominal pain. Negative for constipation, diarrhea, nausea and vomiting.  Genitourinary: Negative.  Negative for dysuria, vaginal bleeding and vaginal discharge.  Neurological: Negative.  Negative for dizziness and headaches.   Physical Exam   Blood pressure 117/72, pulse 80, temperature 98.3 F (36.8 C), resp. rate 18, height '5\' 6"'$  (1.676 m), weight 71.2 kg, last menstrual period 09/07/2021, unknown if currently breastfeeding.  Patient Vitals for the past 24 hrs:  BP Temp Pulse Resp Height Weight  10/03/21 1206 117/72 98.3 F (36.8 C) 80 18 '5\' 6"'$  (1.676 m) 71.2 kg    Physical Exam Vitals and nursing note reviewed.  Constitutional:      General: She is not in acute distress.    Appearance: She is well-developed.  HENT:     Head: Normocephalic.  Eyes:     Pupils: Pupils are equal,  round, and reactive to light.  Cardiovascular:     Rate and Rhythm: Normal rate and regular rhythm.     Heart sounds: Normal heart sounds.  Pulmonary:     Effort: Pulmonary effort is normal. No respiratory distress.     Breath sounds: Normal breath sounds.  Abdominal:     General: Bowel sounds are normal. There is no distension.     Palpations: Abdomen is soft.     Tenderness: There is no abdominal tenderness.  Skin:    General: Skin is warm and dry.  Neurological:     Mental Status: She is alert and oriented to person, place, and time.  Psychiatric:        Mood and Affect: Mood normal.        Behavior: Behavior normal.        Thought Content: Thought content normal.        Judgment: Judgment normal.     MAU Course  Procedures  Results for orders placed or performed during the hospital encounter of 10/03/21 (from the past 24 hour(s))  Pregnancy, urine POC     Status: Abnormal   Collection Time: 10/03/21 11:54 AM  Result Value Ref Range   Preg Test, Ur POSITIVE (A) NEGATIVE  Urinalysis, Routine w reflex microscopic Urine, Clean Catch     Status: Abnormal   Collection Time: 10/03/21 12:07 PM  Result Value Ref Range   Color, Urine STRAW (A) YELLOW   APPearance CLEAR CLEAR   Specific Gravity, Urine 1.012 1.005 - 1.030   pH 8.0 5.0 - 8.0   Glucose, UA NEGATIVE NEGATIVE mg/dL   Hgb urine dipstick NEGATIVE NEGATIVE   Bilirubin Urine NEGATIVE NEGATIVE   Ketones, ur 5 (A) NEGATIVE mg/dL   Protein, ur NEGATIVE NEGATIVE mg/dL   Nitrite NEGATIVE NEGATIVE   Leukocytes,Ua NEGATIVE NEGATIVE  CBC     Status: None   Collection Time: 10/03/21 12:22 PM  Result Value Ref Range   WBC 4.4 4.0 - 10.5 K/uL   RBC 4.19 3.87 - 5.11 MIL/uL   Hemoglobin 12.8 12.0 - 15.0 g/dL   HCT 39.0 36.0 - 46.0 %   MCV 93.1 80.0 - 100.0 fL   MCH 30.5 26.0 - 34.0 pg   MCHC 32.8 30.0 - 36.0 g/dL   RDW 12.8 11.5 - 15.5 %   Platelets 200 150 - 400 K/uL   nRBC 0.0 0.0 - 0.2 %  Comprehensive metabolic panel      Status: Abnormal  Collection Time: 10/03/21 12:22 PM  Result Value Ref Range   Sodium 135 135 - 145 mmol/L   Potassium 4.1 3.5 - 5.1 mmol/L   Chloride 101 98 - 111 mmol/L   CO2 22 22 - 32 mmol/L   Glucose, Bld 93 70 - 99 mg/dL   BUN 9 6 - 20 mg/dL   Creatinine, Ser 0.70 0.44 - 1.00 mg/dL   Calcium 8.9 8.9 - 10.3 mg/dL   Total Protein 6.6 6.5 - 8.1 g/dL   Albumin 3.9 3.5 - 5.0 g/dL   AST 130 (H) 15 - 41 U/L   ALT 297 (H) 0 - 44 U/L   Alkaline Phosphatase 61 38 - 126 U/L   Total Bilirubin 0.7 0.3 - 1.2 mg/dL   GFR, Estimated >60 >60 mL/min   Anion gap 12 5 - 15  hCG, quantitative, pregnancy     Status: Abnormal   Collection Time: 10/03/21 12:22 PM  Result Value Ref Range   hCG, Beta Chain, Quant, S 5,512 (H) <5 mIU/mL     US OB LESS THAN 14 WEEKS WITH OB TRANSVAGINAL  Result Date: 10/03/2021 CLINICAL DATA:  Pelvic pain, left lower quadrant pain EXAM: OBSTETRIC <14 WK [place] AND TRANSVAGINAL OB US TECHNIQUE: Both transabdominal and transvaginal ultrasound examinations were performed for complete evaluation of the gestation as well as the maternal uterus, adnexal regions, and pelvic cul-de-sac. Transvaginal technique was performed to assess early pregnancy. COMPARISON:  07/31/2021 FINDINGS: Intrauterine gestational sac: Single. There is mild lobulation in the margin of the this fluid collection. Yolk sac:  Seen Embryo:  Not seen Cardiac Activity: Not seen MSD: 5.6 mm   5 w   2 d Subchorionic hemorrhage:  None visualized. Maternal uterus/adnexae: Unremarkable. IMPRESSION: There is a gestational sac containing yolk sac within the uterus. There is no demonstrable fetal pole or fetal cardiac activity. Findings may suggest very early normal IUP or failed gestation with incomplete abortion. Serial HCG estimations and follow-up sonogram in 1-2 weeks may be considered. There are no dominant adnexal masses. There is no free fluid in the pelvis. Electronically Signed   By: Elmer Picker M.D.    On: 10/03/2021 13:34     MDM Labs ordered and reviewed.   UA, UPT CBC, HCG ABO/Rh- A Pos Wet prep and gc/chlamydia US OB Comp Less 14 weeks with Transvaginal  CNM independently reviewed the imaging ordered. Imaging show intrauterine gestational sac with yolk sac  Assessment and Plan   1. Normal intrauterine pregnancy on prenatal ultrasound in first trimester   2. [redacted] weeks gestation of pregnancy   3. Hx of methotrexate therapy     -Discharge home in stable condition -First trimester precautions discussed -Patient advised to follow-up with [facility] on 6/27 for repeat ultrasound, appointment made -Patient may return to MAU as needed or if her condition were to change or worsen  Wende Mott, CNM 10/03/2021, 12:15 PM

## 2021-10-03 NOTE — MAU Note (Signed)
.  Felicia Davila is a 31 y.o. at Unknown here in MAU reporting: started having pain in her left side for a few weeks has gotten worse  over the last few days. Denies any vag bleeding or discharge.  Had ectopic pregnancy in April.BHCG Level went down to 3.  LMP: 09/07/21 Onset of complaint: 2-3 weeks Pain score: 7 There were no vitals filed for this visit.   FHT:n/a Lab orders placed from triage:  u/a UPT

## 2021-10-03 NOTE — Discharge Instructions (Signed)

## 2021-10-20 ENCOUNTER — Other Ambulatory Visit: Payer: Self-pay

## 2021-10-20 DIAGNOSIS — O3680X0 Pregnancy with inconclusive fetal viability, not applicable or unspecified: Secondary | ICD-10-CM

## 2021-10-22 IMAGING — DX DG FOREARM 2V*L*
2 series · 2 of 2 positions shown · non-contrast
Comparison: None.

CLINICAL DATA: 28-year-old with prior ORIF of proximal radius and
ulnar fractures, presenting with LATERAL LEFT forearm pain after
striking her forearm at work 2 days ago. Initial encounter.

EXAM:
LEFT FOREARM - 2 VIEW

[forearm ap]
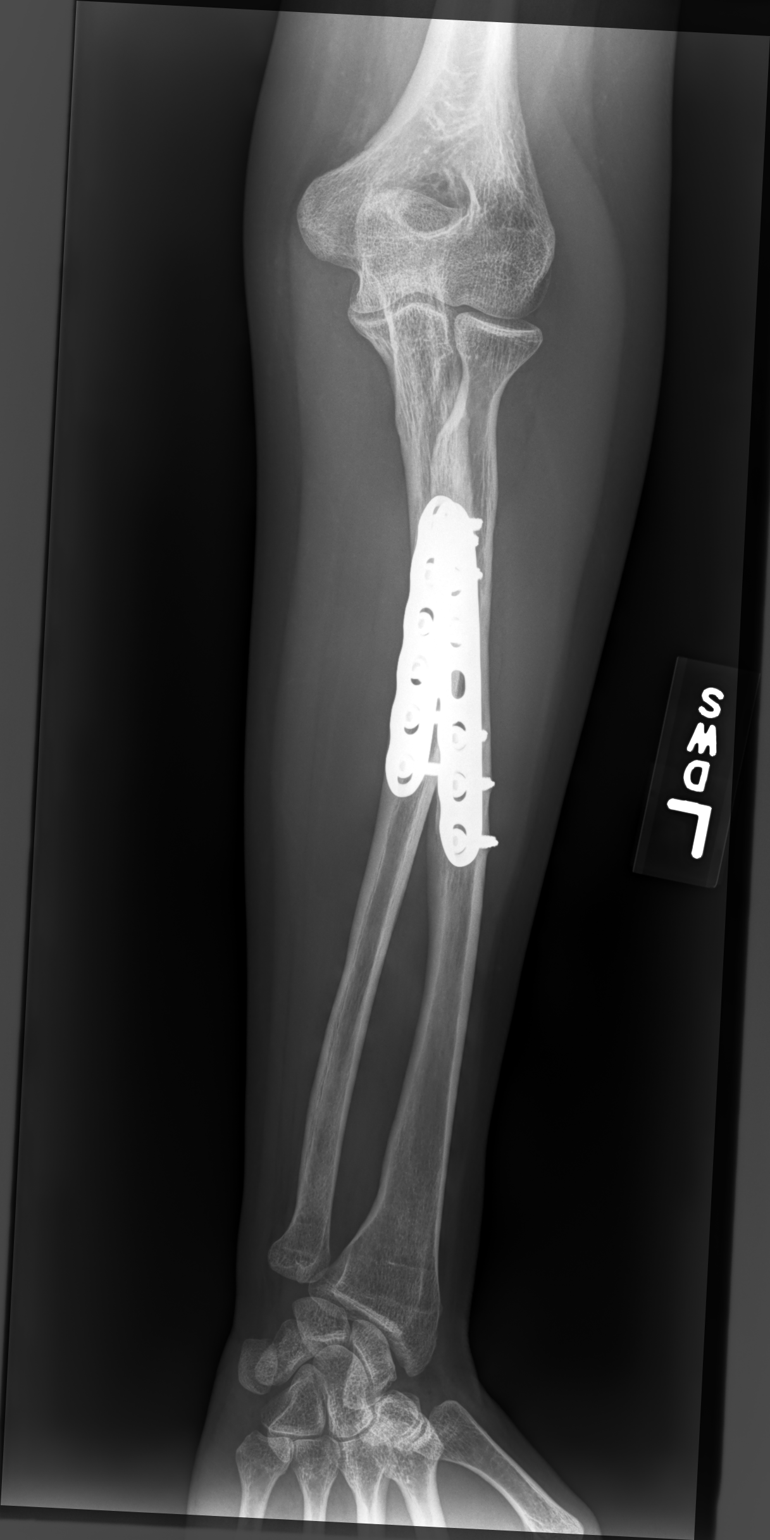

[forearm lat]
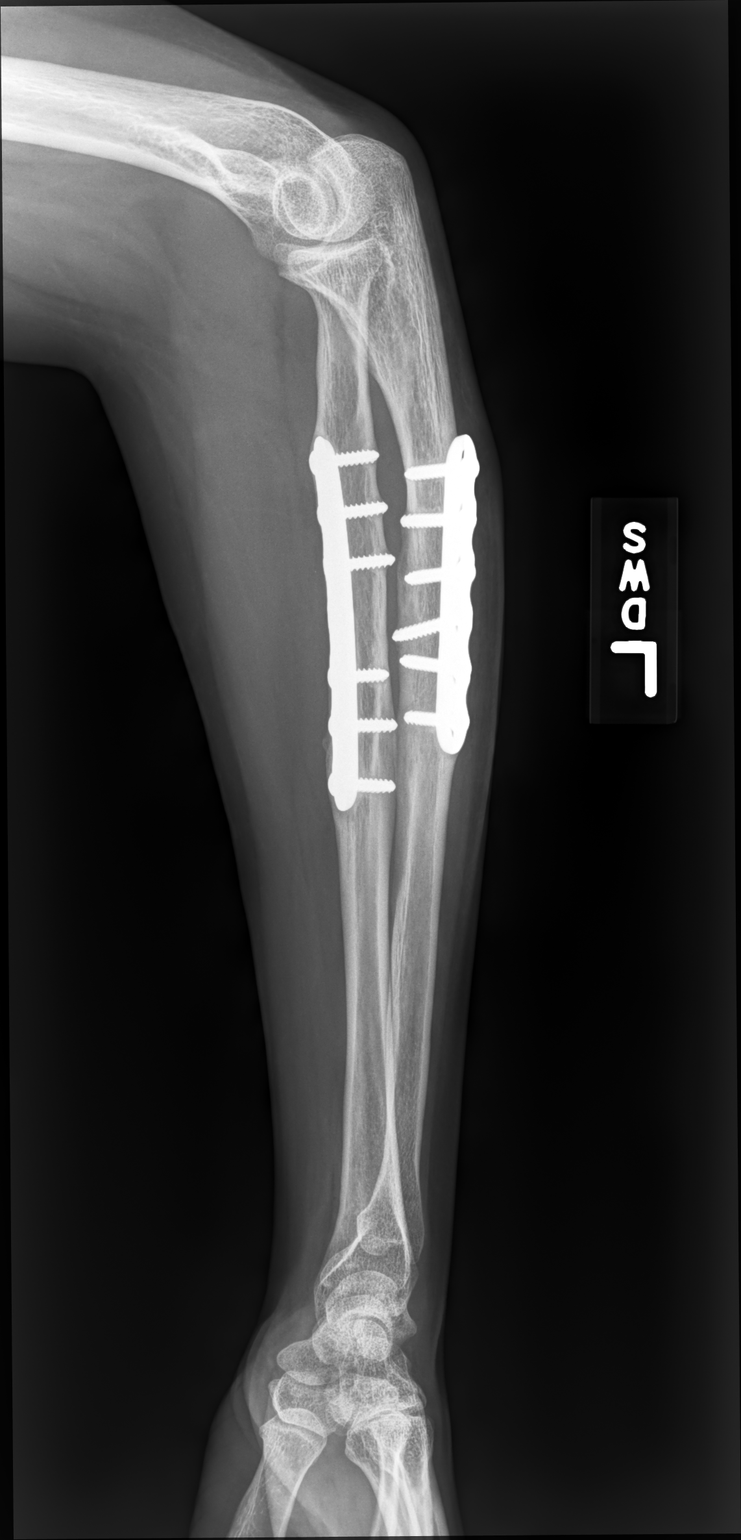

[2 of 2 positions shown; findings below may reference images not displayed]

FINDINGS: No evidence of acute fracture involving the radius or ulna. Prior
ORIF of proximal radius and ulna fractures with plate and screw
fixation, normal healing, without complicating features. Visualized
elbow joint and wrist joint anatomically aligned without
degenerative changes.
IMPRESSION: No acute osseous abnormality. Prior ORIF of proximal radius and ulna
fractures with normal healing and no complicating features.

## 2021-10-23 ENCOUNTER — Encounter (HOSPITAL_COMMUNITY): Payer: Self-pay | Admitting: Family Medicine

## 2021-10-23 ENCOUNTER — Inpatient Hospital Stay (HOSPITAL_COMMUNITY)
Admission: AD | Admit: 2021-10-23 | Discharge: 2021-10-23 | Disposition: A | Discharge status: Home / Self Care | Payer: Medicaid Other | Attending: Family Medicine | Admitting: Family Medicine

## 2021-10-23 ENCOUNTER — Inpatient Hospital Stay (HOSPITAL_COMMUNITY): Payer: Medicaid Other

## 2021-10-23 ENCOUNTER — Other Ambulatory Visit: Payer: Self-pay

## 2021-10-23 DIAGNOSIS — O039 Complete or unspecified spontaneous abortion without complication: Secondary | ICD-10-CM | POA: Diagnosis present

## 2021-10-23 DIAGNOSIS — Z6790 Unspecified blood type, Rh positive: Secondary | ICD-10-CM

## 2021-10-23 LAB — COMPREHENSIVE METABOLIC PANEL
ALT: 103 U/L — ABNORMAL HIGH (ref 0–44)
AST: 58 U/L — ABNORMAL HIGH (ref 15–41)
Albumin: 3.3 g/dL — ABNORMAL LOW (ref 3.5–5.0)
Alkaline Phosphatase: 42 U/L (ref 38–126)
Anion gap: 7 (ref 5–15)
BUN: 6 mg/dL (ref 6–20)
CO2: 25 mmol/L (ref 22–32)
Calcium: 8.5 mg/dL — ABNORMAL LOW (ref 8.9–10.3)
Chloride: 107 mmol/L (ref 98–111)
Creatinine, Ser: 0.63 mg/dL (ref 0.44–1.00)
GFR, Estimated: >60 mL/min (ref >60)
Glucose, Bld: 105 mg/dL — ABNORMAL HIGH (ref 70–99)
Potassium: 4.2 mmol/L (ref 3.5–5.1)
Sodium: 139 mmol/L (ref 135–145)
Total Bilirubin: 0.5 mg/dL (ref 0.3–1.2)
Total Protein: 5.9 g/dL — ABNORMAL LOW (ref 6.5–8.1)

## 2021-10-23 LAB — CBC WITH DIFFERENTIAL/PLATELET
Abs Immature Granulocytes: 0.01 10*3/uL (ref 0.00–0.07)
Basophils Absolute: 0 10*3/uL (ref 0.0–0.1)
Basophils Relative: 0 %
Eosinophils Absolute: 0 10*3/uL (ref 0.0–0.5)
Eosinophils Relative: 1 %
HCT: 37.5 % (ref 36.0–46.0)
Hemoglobin: 12.4 g/dL (ref 12.0–15.0)
Immature Granulocytes: 0 %
Lymphocytes Relative: 18 %
Lymphs Abs: 1 10*3/uL (ref 0.7–4.0)
MCH: 30.8 pg (ref 26.0–34.0)
MCHC: 33.1 g/dL (ref 30.0–36.0)
MCV: 93.3 fL (ref 80.0–100.0)
Monocytes Absolute: 0.7 10*3/uL (ref 0.1–1.0)
Monocytes Relative: 13 %
Neutro Abs: 3.7 10*3/uL (ref 1.7–7.7)
Neutrophils Relative %: 68 %
Platelets: 201 10*3/uL (ref 150–400)
RBC: 4.02 MIL/uL (ref 3.87–5.11)
RDW: 12.6 % (ref 11.5–15.5)
WBC: 5.4 10*3/uL (ref 4.0–10.5)
nRBC: 0 % (ref 0.0–0.2)

## 2021-10-23 LAB — URINALYSIS, ROUTINE W REFLEX MICROSCOPIC
Bilirubin Urine: NEGATIVE
Glucose, UA: NEGATIVE mg/dL
Ketones, ur: NEGATIVE mg/dL
Leukocytes,Ua: NEGATIVE
Nitrite: NEGATIVE
Protein, ur: 30 mg/dL — AB
RBC / HPF: >50 RBC/hpf — ABNORMAL HIGH (ref 0–5)
Specific Gravity, Urine: 1.017 (ref 1.005–1.030)
pH: 6 (ref 5.0–8.0)

## 2021-10-23 LAB — HCG, QUANTITATIVE, PREGNANCY: hCG, Beta Chain, Quant, S: 20271 m[IU]/mL — ABNORMAL HIGH (ref <5)

## 2021-10-23 MED ORDER — IBUPROFEN 600 MG PO TABS: 600.0000 mg | ORAL_TABLET | Freq: Four times a day (QID) | ORAL | 0 refills | Status: DC | PRN

## 2021-10-23 MED ORDER — HYDROCODONE-ACETAMINOPHEN 5-325 MG PO TABS: 2.0000 | ORAL_TABLET | ORAL | 0 refills | Status: AC | PRN

## 2021-10-23 MED ORDER — HYDROMORPHONE HCL 2 MG PO TABS
2.0000 mg | ORAL_TABLET | ORAL | Status: DC | PRN
  Administered 2021-10-23: 2 mg via ORAL
  Filled 2021-10-23: qty 1

## 2021-10-23 MED ORDER — MISOPROSTOL 200 MCG PO TABS: ORAL_TABLET | ORAL | 0 refills | Status: DC

## 2021-10-23 MED ORDER — ONDANSETRON 4 MG PO TBDP: 4.0000 mg | ORAL_TABLET | Freq: Four times a day (QID) | ORAL | 0 refills | Status: DC | PRN

## 2021-10-23 MED ORDER — ACETAMINOPHEN 500 MG PO TABS
1000.0000 mg | ORAL_TABLET | Freq: Once | ORAL | Status: AC
Start: 2021-10-23 — End: 2021-10-23
  Administered 2021-10-23: 1000 mg via ORAL
  Filled 2021-10-23: qty 2

## 2021-10-23 NOTE — MAU Provider Note (Signed)
History     CSN: [id number]  Arrival date and time: 10/23/21 1349   Event Date/Time   First Provider Initiated Contact with Patient 10/23/21 1534      Chief Complaint  Patient presents with   Vaginal Bleeding   HPI Felicia Davila is a 31 y.o. [id number] at 22w1dby LMP who presents to MAU with chief complaint of vaginal bleeding. This is a recurrent problem, onset Wednesday 06/28. Patient reports passing small clots when her bleeding started but her total bleeding has slowly decreased since onset.  Patient also reports lower abdominal pain and low back pain. Pain scores are 8/10 and 9/10, respectively. Patient states she has also started to feel weak and chilled as her pain has intensified.  OB History     Gravida  4   Para  2   Term  1   Preterm  1   AB  1   Living  2      SAB      IAB      Ectopic  1   Multiple      Live Births  2           Past Medical History:  Diagnosis Date   Anxiety    Depression    [place]     Past Surgical History:  Procedure Laterality Date   CESAREAN SECTION     CHOLECYSTECTOMY     FOREARM SURGERY      Family History  Problem Relation Age of Onset   Healthy Mother    Lupus Father     Social History   Tobacco Use   Smoking status: Former   Smokeless tobacco: Never  VScientific laboratory technicianUse: Former  Substance Use Topics   Alcohol use: No   Drug use: No    Allergies:  Allergies  Allergen Reactions   Morphine And Related Anaphylaxis, Rash and Other (See Comments)    Per extensive conversation with patient, surgical attending and surgical PA, patient does NOT have an anaphylactic reaction to morphine. Has taken for short periods in the past without issues. Pins and needle feeling in extremities. (??)     Nitrofurantoin Nausea And Vomiting and Other (See Comments)    Also, "felt "weird in the head"    Hydrocodone Other (See Comments)    Pins and needle feeling in extremities    Medications Prior  to Admission  Medication Sig Dispense Refill Last Dose   acetaminophen (TYLENOL) 325 MG tablet Take 650 mg by mouth every 6 (six) hours as needed.      ondansetron (ZOFRAN) 4 MG tablet Take 4 mg by mouth every 8 (eight) hours as needed for nausea or vomiting.       Review of Systems  Constitutional:  Positive for chills and fatigue.  Gastrointestinal:  Positive for abdominal pain.  Genitourinary:  Positive for vaginal bleeding.  All other systems reviewed and are negative.  Physical Exam   Blood pressure 112/82, pulse (!) 104, temperature 97.8 F (36.6 C), temperature source Oral, resp. rate 18, height '5\' 6"'$  (1.676 m), weight 73.2 kg, last menstrual period 09/07/2021, SpO2 100 %, unknown if currently breastfeeding.  Physical Exam Vitals and nursing note reviewed. Exam conducted with a chaperone present.  Constitutional:      Appearance: Normal appearance.  Cardiovascular:     Rate and Rhythm: Normal rate and regular rhythm.     Pulses: Normal pulses.     Heart sounds: Normal heart sounds.  Pulmonary:     Effort: Pulmonary effort is normal.     Breath sounds: Normal breath sounds.  Skin:    Capillary Refill: Capillary refill takes less than 2 seconds.  Neurological:     Mental Status: She is alert and oriented to person, place, and time.  Psychiatric:        Mood and Affect: Mood normal.        Behavior: Behavior normal.        Thought Content: Thought content normal.        Judgment: Judgment normal.     MAU Course  Procedures  MDM  --EMR reviewed: MAU visit 10/03/2021 for pregnancy of unknown location. US observed GS + YS --SAB confirmed on today's [place]. Reviewed options for management   Orders Placed This Encounter  Procedures   US OB LESS THAN 14 WEEKS WITH OB TRANSVAGINAL   Urinalysis, Routine w reflex microscopic Urine, Clean Catch   CBC with Differential/Platelet   Comprehensive metabolic panel   hCG, quantitative, pregnancy   Discharge patient   Patient  Vitals for the past 24 hrs:  BP Temp Temp src Pulse Resp SpO2 Height Weight  10/23/21 1558 105/61 -- -- 90 -- -- -- --  10/23/21 1410 112/82 97.8 F (36.6 C) Oral (!) 104 18 100 % -- --  10/23/21 1401 -- -- -- -- -- -- '5\' 6"'$  (1.676 m) 73.2 kg   Results for orders placed or performed during the hospital encounter of 10/23/21 (from the past 24 hour(s))  Urinalysis, Routine w reflex microscopic Urine, Clean Catch     Status: Abnormal   Collection Time: 10/23/21  2:26 PM  Result Value Ref Range   Color, Urine YELLOW YELLOW   APPearance HAZY (A) CLEAR   Specific Gravity, Urine 1.017 1.005 - 1.030   pH 6.0 5.0 - 8.0   Glucose, UA NEGATIVE NEGATIVE mg/dL   Hgb urine dipstick LARGE (A) NEGATIVE   Bilirubin Urine NEGATIVE NEGATIVE   Ketones, ur NEGATIVE NEGATIVE mg/dL   Protein, ur 30 (A) NEGATIVE mg/dL   Nitrite NEGATIVE NEGATIVE   Leukocytes,Ua NEGATIVE NEGATIVE   RBC / HPF >50 (H) 0 - 5 RBC/hpf   WBC, UA 0-5 0 - 5 WBC/hpf   Bacteria, UA RARE (A) NONE SEEN   Squamous Epithelial / LPF 0-5 0 - 5  CBC with Differential/Platelet     Status: None   Collection Time: 10/23/21  2:35 PM  Result Value Ref Range   WBC 5.4 4.0 - 10.5 K/uL   RBC 4.02 3.87 - 5.11 MIL/uL   Hemoglobin 12.4 12.0 - 15.0 g/dL   HCT 37.5 36.0 - 46.0 %   MCV 93.3 80.0 - 100.0 fL   MCH 30.8 26.0 - 34.0 pg   MCHC 33.1 30.0 - 36.0 g/dL   RDW 12.6 11.5 - 15.5 %   Platelets 201 150 - 400 K/uL   nRBC 0.0 0.0 - 0.2 %   Neutrophils Relative % 68 %   Neutro Abs 3.7 1.7 - 7.7 K/uL   Lymphocytes Relative 18 %   Lymphs Abs 1.0 0.7 - 4.0 K/uL   Monocytes Relative 13 %   Monocytes Absolute 0.7 0.1 - 1.0 K/uL   Eosinophils Relative 1 %   Eosinophils Absolute 0.0 0.0 - 0.5 K/uL   Basophils Relative 0 %   Basophils Absolute 0.0 0.0 - 0.1 K/uL   Immature Granulocytes 0 %   Abs Immature Granulocytes 0.01 0.00 - 0.07 K/uL  Comprehensive metabolic panel  Status: Abnormal   Collection Time: 10/23/21  2:35 PM  Result Value Ref  Range   Sodium 139 135 - 145 mmol/L   Potassium 4.2 3.5 - 5.1 mmol/L   Chloride 107 98 - 111 mmol/L   CO2 25 22 - 32 mmol/L   Glucose, Bld 105 (H) 70 - 99 mg/dL   BUN 6 6 - 20 mg/dL   Creatinine, Ser 0.63 0.44 - 1.00 mg/dL   Calcium 8.5 (L) 8.9 - 10.3 mg/dL   Total Protein 5.9 (L) 6.5 - 8.1 g/dL   Albumin 3.3 (L) 3.5 - 5.0 g/dL   AST 58 (H) 15 - 41 U/L   ALT 103 (H) 0 - 44 U/L   Alkaline Phosphatase 42 38 - 126 U/L   Total Bilirubin 0.5 0.3 - 1.2 mg/dL   GFR, Estimated >60 >60 mL/min   Anion gap 7 5 - 15  hCG, quantitative, pregnancy     Status: Abnormal   Collection Time: 10/23/21  2:35 PM  Result Value Ref Range   hCG, Beta Chain, Quant, S 20,271 (H) <5 mIU/mL    US OB LESS THAN 14 WEEKS WITH OB TRANSVAGINAL  Result Date: 10/23/2021 CLINICAL DATA:  Bleeding during pregnancy. Patient is 6 weeks and 4 days pregnant based on the last menstrual period. EXAM: OBSTETRIC <14 WK [place] AND TRANSVAGINAL OB US TECHNIQUE: Both transabdominal and transvaginal ultrasound examinations were performed for complete evaluation of the gestation as well as the maternal uterus, adnexal regions, and pelvic cul-de-sac. Transvaginal technique was performed to assess early pregnancy. COMPARISON:  10/03/2021. FINDINGS: Intrauterine gestational sac: None Yolk sac:  Not Visualized. Embryo:  Not Visualized. Maternal uterus/adnexae: Endometrium is thickened and heterogeneous with cystic spaces, extending into the endocervix, which is widened to approximately 2 cm. No uterine masses. Ovaries and adnexa are unremarkable. No abnormal pelvic free fluid. IMPRESSION: 1. Findings consistent with a failed intrauterine pregnancy. 2. Thickened heterogeneous endometrium with echogenic material that distends the endocervical canal consistent with hemorrhage and/or retained products of conception. Electronically Signed   By: Lajean Manes M.D.   On: 10/23/2021 15:29     Early Intrauterine Pregnancy Failure  X Documented  intrauterine pregnancy failure less than or equal to [redacted] weeks gestation  X  No serious current illness  X  Baseline Hgb greater than or equal to 10g/dl  X  Patient has easily accessible transportation to the hospital  X  Clear preference  X  Practitioner/physician deems patient reliable  X  Counseling by practitioner or physician  N/A Rho-Gam given by RN if indicated  X Medication dispensed:   ___   Cytotec 800 mcg (PO patient at home)    ___  Ibuprofen 600 mg 1 tablet by mouth every 6 hours as needed #30  ___  Hydrocodone/acetaminophen 5/325 mg by mouth every 4 to 6 hours as needed  ___  Zofran per patient preference (rather than Phenergan)  Meds ordered this encounter  Medications   acetaminophen (TYLENOL) tablet 1,000 mg   HYDROmorphone (DILAUDID) tablet 2 mg    Previous administration without allergic response   HYDROcodone-acetaminophen (NORCO/VICODIN) 5-325 MG tablet    Sig: Take 2 tablets by mouth every 4 (four) hours as needed for up to 2 days.    Dispense:  6 tablet    Refill:  0    Patient does not have a true allergy to hydrocodone and has taken without side effects   ibuprofen (ADVIL) 600 MG tablet    Sig: Take 1  tablet (600 mg total) by mouth every 6 (six) hours as needed for up to 30 doses.    Dispense:  30 tablet    Refill:  0   ondansetron (ZOFRAN-ODT) 4 MG disintegrating tablet    Sig: Take 1 tablet (4 mg total) by mouth every 6 (six) hours as needed for nausea.    Dispense:  20 tablet    Refill:  0   misoprostol (CYTOTEC) 200 MCG tablet    Sig: Swallow all pills in one administration    Dispense:  4 tablet    Refill:  0   Assessment and Plan  --31 y.o. [id number] confirmed SAB --Hgb 12.4 --Rh + --Cytotec for home administration per patient preference --Discharge home in stable condition  F/U: Secure message sent to [facility] to please scheduled follow-up in 1 week  Darlina Rumpf, CNM 10/23/2021, 7:53 PM

## 2021-10-23 NOTE — MAU Note (Signed)
Felicia Davila is a 31 y.o. at 45w1dhere in MAU reporting: had heavy VB and passing clots on Wednesday, but VB decreased on Thursday.  Reports VB remains today but not passing aby clots.  States having both lower abdominal and back pain, worse in back.  Reports took Tylenol 1000 mg & Ibuprofen 600 mg @ 1100 this morning.  Also states feels dizzy and having cold sweats.  Onset of complaint: Wednesday Pain score: 8/10 abdomen & back 9/10 Vitals:   10/23/21 1410  BP: 112/82  Pulse: (!) 104  Resp: 18  Temp: 97.8 F (36.6 C)  SpO2: 100%     FHT:N/A Lab orders placed from triage:   UA

## 2021-10-26 ENCOUNTER — Other Ambulatory Visit: Payer: Self-pay

## 2021-10-26 ENCOUNTER — Encounter: Payer: Self-pay | Admitting: Family Medicine

## 2021-10-26 ENCOUNTER — Ambulatory Visit (INDEPENDENT_AMBULATORY_CARE_PROVIDER_SITE_OTHER): Payer: Medicaid Other | Admitting: Family Medicine

## 2021-10-26 VITALS — BP 117/82 | HR 85 | Temp 98.5°F | Wt 162.6 lb

## 2021-10-26 DIAGNOSIS — O034 Incomplete spontaneous abortion without complication: Secondary | ICD-10-CM | POA: Diagnosis not present

## 2021-10-26 NOTE — Progress Notes (Unsigned)
Cytotec taken 10/23/21. Started bleeding again yesterday 10/25/21. Reports feelings of dizziness, fatigue, and heart racing.

## 2021-10-26 NOTE — Progress Notes (Unsigned)
   Subjective:    Patient ID: Felicia Davila is a 31 y.o. female presenting with No chief complaint on file.  on 10/26/2021  HPI: Patient with SAB on 6/28. Went to MAU on 6/30 and looked to have possible retained POC. Given cytotec. Minimal bleeding since.    Review of Systems  Constitutional:  Negative for chills and fever.  Respiratory:  Negative for shortness of breath.   Cardiovascular:  Negative for chest pain.  Gastrointestinal:  Negative for abdominal pain, nausea and vomiting.  Genitourinary:  Negative for dysuria.  Skin:  Negative for rash.      Objective:    BP 117/82   Pulse 85   Temp 98.5 F (36.9 C)   Wt 162 lb 9.6 oz (73.8 kg)   LMP 09/07/2021   SpO2 98%   Breastfeeding No   BMI 26.24 kg/m  Physical Exam Exam conducted with a chaperone present.  Constitutional:      General: She is not in acute distress.    Appearance: She is well-developed.  HENT:     Head: Normocephalic and atraumatic.  Eyes:     General: No scleral icterus. Cardiovascular:     Rate and Rhythm: Normal rate.  Pulmonary:     Effort: Pulmonary effort is normal.  Abdominal:     Palpations: Abdomen is soft.  Musculoskeletal:     Cervical back: Neck supple.  Skin:    General: Skin is warm and dry.  Neurological:     Mental Status: She is alert and oriented to person, place, and time.         Assessment & Plan:  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 - repeat us--if still present, book for D & E. - Plan: US PELVIC COMPLETE WITH TRANSVAGINAL   Return in about 2 weeks (around 11/09/2021) for a follow-up.  Donnamae Jude, MD 10/26/2021 4:33 PM

## 2021-10-28 ENCOUNTER — Encounter: Payer: Self-pay | Admitting: Family Medicine

## 2021-11-02 ENCOUNTER — Ambulatory Visit (INDEPENDENT_AMBULATORY_CARE_PROVIDER_SITE_OTHER): Payer: Medicaid Other | Admitting: Obstetrics and Gynecology

## 2021-11-02 ENCOUNTER — Encounter: Payer: Self-pay | Admitting: Obstetrics and Gynecology

## 2021-11-02 ENCOUNTER — Ambulatory Visit (HOSPITAL_COMMUNITY)
Admission: RE | Admit: 2021-11-02 | Discharge: 2021-11-02 | Disposition: A | Discharge status: Home / Self Care | Payer: Medicaid Other | Source: Ambulatory Visit | Attending: Family Medicine | Admitting: Family Medicine

## 2021-11-02 ENCOUNTER — Telehealth: Payer: Self-pay

## 2021-11-02 ENCOUNTER — Other Ambulatory Visit: Payer: Self-pay

## 2021-11-02 VITALS — BP 107/69 | HR 85 | Ht 66.0 in | Wt 159.8 lb

## 2021-11-02 DIAGNOSIS — O034 Incomplete spontaneous abortion without complication: Secondary | ICD-10-CM | POA: Insufficient documentation

## 2021-11-02 NOTE — Progress Notes (Signed)
Obstetrics and Gynecology Visit Return Patient Evaluation  Appointment Date: 11/02/2021  Primary Care Provider: Patient, No Pcp Per  OBGYN Clinic: [facility]   Chief Complaint: ultrasound follow up  History of Present Illness:  Lennon Richins is a 31 y.o. s/p recent five week incomplete SAB in the MAU diagnosed on 6/30. Pt given cytotec and then see by Dr. Kennon Rounds on 7/3 and s/s not convincing for passage of products so u/s ordered. A POS  Interval History: Since that time, she states that she had about 3 pads of non saturating bleeding yesterday and cramping at times. No fevers, chills  Review of Systems: as noted in the History of Present Illness.  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 Current Outpatient Medications  Medication Sig Dispense Refill   ibuprofen (ADVIL) 600 MG tablet Take 1 tablet (600 mg total) by mouth every 6 (six) hours as needed for up to 30 doses. 30 tablet 0   ondansetron (ZOFRAN-ODT) 4 MG disintegrating tablet Take 1 tablet (4 mg total) by mouth every 6 (six) hours as needed for nausea. 20 tablet 0   misoprostol (CYTOTEC) 200 MCG tablet Swallow all pills in one administration (Patient not taking: Reported on 11/02/2021) 4 tablet 0   No current facility-administered medications for this visit.    Allergies: is allergic to morphine and related, nitrofurantoin, and hydrocodone.  Physical Exam:  BP 107/69   Pulse 85   Ht '5\' 6"'$  (1.676 m)   Wt 159 lb 12.8 oz (72.5 kg)   LMP 09/07/2021 Comment: SAB  Breastfeeding Unknown   BMI 25.79 kg/m  Body mass index is 25.79 kg/m. General appearance: Well nourished, well developed female in no acute distress.  Abdomen:  nttp Neuro/Psych:  Normal mood and affect.    Radiology: official reading pending u/s images appear to show +AV flow and moderate amount of retained POCs in the fundal area  Assessment: pt stable with retained POCs  Plan:  1. 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 F/u final read.  D/w her re: recommendation for suction d&c. ED precautions given. Scheduling email sent   RTC: post op  Durene Romans MD Attending [facility])

## 2021-11-02 NOTE — Progress Notes (Signed)
Pt states has not stopped bleeding/  Has completed the Cytotec.

## 2021-11-02 NOTE — Telephone Encounter (Signed)
Called patient, no answer, left voicemail with surgery date, time, location and preop instructions. 

## 2021-11-04 ENCOUNTER — Ambulatory Visit: Payer: Medicaid Other | Admitting: Obstetrics and Gynecology

## 2021-11-05 ENCOUNTER — Encounter (HOSPITAL_COMMUNITY): Payer: Self-pay | Admitting: Obstetrics and Gynecology

## 2021-11-05 NOTE — Progress Notes (Signed)
I was unable to reach Felicia Davila  by phone.  I left a message on voice mail.  I instructed the patient to arrive at [facility] entrance at - 0930 , register in the [place]. DO NOT eat or drink anything after midnight.  I instructed the patient to take the following medications in the am with just enough water to get them down:  Zofran if she is still taking and needs it. Pharmacy was not able to reach patient. I instructed patient to not take any NSAIDS, if there is a medications she thinks she needs to take cal Pre- OP desk and I instructed Felicia Davila to shower with antibiotics soap dry off with clean towel . I instructed Felicia Davila sked to not wear any lotions, powders, cologne, jewelry, piercing, make-up or nail polish.  Wear clean clothes. Brush teeth. I informed patient that there will need to be a driver and someone to stay with him/her for the first 24 hours after surgery.   I instructed  patient to call [phone], in the am if there were any questions or problems.

## 2021-11-05 NOTE — H&P (Signed)
Felicia Davila is an 31 y.o. female P30 diagnosed with miscarriage on 10/21/21 medically treated with cytotec with persistent vaginal bleeding and ultrasound findings concerning for retained products of conception. Patient is is here scheduled dilatation and evacuation. She reports feeling well and is without any other complaints.    Menstrual History: Patient's last menstrual period was 09/07/2021.    Past Medical History:  Diagnosis Date   Anxiety    Depression    Lupus (HCC)    PONV (postoperative nausea and vomiting)     Past Surgical History:  Procedure Laterality Date   CESAREAN SECTION     CHOLECYSTECTOMY     FOREARM SURGERY Left     Family History  Problem Relation Age of Onset   Healthy Mother    Lupus Father     Social History:  reports that she has quit smoking. She has never used smokeless tobacco. She reports that she does not drink alcohol and does not use drugs.  Allergies:  Allergies  Allergen Reactions   Morphine And Related Anaphylaxis, Rash and Other (See Comments)    Per extensive conversation with patient, surgical attending and surgical PA, patient does NOT have an anaphylactic reaction to morphine. Has taken for short periods in the past without issues. Pins and needle feeling in extremities. (??)     Nitrofurantoin Nausea And Vomiting and Other (See Comments)    Also, "felt "weird in the head"    Fentanyl Other (See Comments)    "Makes Pt hot and tingly"   Hydrocodone Other (See Comments)    Pins and needle feeling in extremities    Medications Prior to Admission  Medication Sig Dispense Refill Last Dose   acetaminophen (TYLENOL) 500 MG tablet Take 1,000 mg by mouth 2 (two) times daily as needed (cramping).   11/05/2021   ibuprofen (ADVIL) 600 MG tablet Take 1 tablet (600 mg total) by mouth every 6 (six) hours as needed for up to 30 doses. (Patient not taking: Reported on 11/06/2021) 30 tablet 0 Completed Course   misoprostol (CYTOTEC) 200  MCG tablet Swallow all pills in one administration (Patient not taking: Reported on 11/02/2021) 4 tablet 0 Not Taking   ondansetron (ZOFRAN-ODT) 4 MG disintegrating tablet Take 1 tablet (4 mg total) by mouth every 6 (six) hours as needed for nausea. (Patient not taking: Reported on 11/06/2021) 20 tablet 0 Completed Course    Review of Systems See pertinent in HPI. All other systems reviewed and non contributory Blood pressure 110/71, pulse 70, temperature 97.9 F (36.6 C), temperature source Oral, resp. rate 18, height '5\' 6"'$  (1.676 m), weight 70.3 kg, last menstrual period 09/07/2021, SpO2 100 %, unknown if currently breastfeeding. Physical Exam GENERAL: Well-developed, well-nourished female in no acute distress.  LUNGS: Clear to auscultation bilaterally.  HEART: Regular rate and rhythm. ABDOMEN: Soft, nontender, nondistended. No organomegaly. PELVIC: Deferred to OR EXTREMITIES: No cyanosis, clubbing, or edema, 2+ distal pulses.  Results for orders placed or performed during the hospital encounter of 11/06/21 (from the past 24 hour(s))  Type and screen [place]     Status: None (Preliminary result)   Collection Time: 11/06/21  9:52 AM  Result Value Ref Range   ABO/RH(D) PENDING    Antibody Screen PENDING    Sample Expiration      11/09/2021,2359 Performed at [facility], [place] [address], [place], [place] [address]     No results found. US PELVIC COMPLETE WITH TRANSVAGINAL  Result Date: 11/02/2021 CLINICAL  DATA:  Retained products of conception, persistent bleeding EXAM: TRANSABDOMINAL AND TRANSVAGINAL ULTRASOUND OF PELVIS TECHNIQUE: Both transabdominal and transvaginal ultrasound examinations of the pelvis were performed. Transabdominal technique was performed for global imaging of the pelvis including uterus, ovaries, adnexal regions, and pelvic cul-de-sac. It was necessary to proceed with endovaginal exam following the transabdominal exam to visualize the  endometrium. COMPARISON:  10/23/2021 FINDINGS: Uterus Measurements: 9.0 x 4.0 x 5.4 cm = volume: 102 mL. Anteverted. Normal morphology without mass Endometrium Thickness: 17 mm. Distended, heterogeneous, containing areas of fluid and abnormal echogenic material. The majority of the heterogeneous area appears avascular but hypervascularity on color Doppler imaging is associated with a small portion. Findings are concerning for retained products of conception at the fundal portion of the endometrial complex with associated hemorrhage. Remainder of the endometrial canal contains hypoechoic heterogeneous material extending into the cervix, likely blood. Right ovary Measurements: 2.8 x 1.3 x 1.6 cm = volume: 3.0 mL. Normal morphology without mass Left ovary Measurements: 2.3 x 1.8 x 2.2 cm = volume: 4.9 mL. Normal morphology without mass Other findings No free pelvic fluid or adnexal masses. IMPRESSION: Distended endometrial complex at the upper uterine segment containing a combination of hypoechoic and hyperechoic material, including a small amount of internal blood flow on color Doppler imaging, concerning for a combination of retained products of conception and blood. Additional hypoechoic material within the remainder of the endometrial canal and endocervical canal likely represents blood as well. Electronically Signed   By: Lavonia Dana M.D.   On: 11/02/2021 15:21   US OB LESS THAN 14 WEEKS WITH OB TRANSVAGINAL  Result Date: 10/23/2021 CLINICAL DATA:  Bleeding during pregnancy. Patient is 6 weeks and 4 days pregnant based on the last menstrual period. EXAM: OBSTETRIC <14 WK [place] AND TRANSVAGINAL OB US TECHNIQUE: Both transabdominal and transvaginal ultrasound examinations were performed for complete evaluation of the gestation as well as the maternal uterus, adnexal regions, and pelvic cul-de-sac. Transvaginal technique was performed to assess early pregnancy. COMPARISON:  10/03/2021. FINDINGS: Intrauterine  gestational sac: None Yolk sac:  Not Visualized. Embryo:  Not Visualized. Maternal uterus/adnexae: Endometrium is thickened and heterogeneous with cystic spaces, extending into the endocervix, which is widened to approximately 2 cm. No uterine masses. Ovaries and adnexa are unremarkable. No abnormal pelvic free fluid. IMPRESSION: 1. Findings consistent with a failed intrauterine pregnancy. 2. Thickened heterogeneous endometrium with echogenic material that distends the endocervical canal consistent with hemorrhage and/or retained products of conception. Electronically Signed   By: Lajean Manes M.D.   On: 10/23/2021 15:29    Assessment/Plan: 31 yo P1122 with recent miscarriage and retained products of conception - Discussed surgical management with D&E - Risks, benefits and alternatives were explained including but not limited to risks of bleeding, infection, uterine perforation and damage to the adjacent organs.  - Patient verbalized understanding and all questions were answered   Tharon Bomar 11/06/2021, 10:06 AM

## 2021-11-06 ENCOUNTER — Ambulatory Visit (HOSPITAL_BASED_OUTPATIENT_CLINIC_OR_DEPARTMENT_OTHER): Payer: Medicaid Other | Admitting: Anesthesiology

## 2021-11-06 ENCOUNTER — Encounter (HOSPITAL_COMMUNITY): Payer: Self-pay | Admitting: Obstetrics and Gynecology

## 2021-11-06 ENCOUNTER — Encounter (HOSPITAL_COMMUNITY)
Admission: RE | Disposition: A | Discharge status: Home / Self Care | Payer: Self-pay | Source: Home / Self Care | Attending: Obstetrics and Gynecology

## 2021-11-06 ENCOUNTER — Other Ambulatory Visit: Payer: Self-pay

## 2021-11-06 ENCOUNTER — Ambulatory Visit (HOSPITAL_COMMUNITY)
Admission: RE | Admit: 2021-11-06 | Discharge: 2021-11-06 | Disposition: A | Discharge status: Home / Self Care | Payer: Medicaid Other | Attending: Obstetrics and Gynecology | Admitting: Obstetrics and Gynecology

## 2021-11-06 ENCOUNTER — Ambulatory Visit (HOSPITAL_COMMUNITY): Payer: Medicaid Other | Admitting: Anesthesiology

## 2021-11-06 DIAGNOSIS — O034 Incomplete spontaneous abortion without complication: Secondary | ICD-10-CM

## 2021-11-06 DIAGNOSIS — F32A Depression, unspecified: Secondary | ICD-10-CM | POA: Diagnosis not present

## 2021-11-06 DIAGNOSIS — M329 Systemic lupus erythematosus, unspecified: Secondary | ICD-10-CM | POA: Diagnosis not present

## 2021-11-06 DIAGNOSIS — Z87891 Personal history of nicotine dependence: Secondary | ICD-10-CM | POA: Insufficient documentation

## 2021-11-06 DIAGNOSIS — F419 Anxiety disorder, unspecified: Secondary | ICD-10-CM | POA: Diagnosis not present

## 2021-11-06 HISTORY — PX: DILATION AND EVACUATION: SHX1459

## 2021-11-06 HISTORY — DX: Other specified postprocedural states: Z98.890

## 2021-11-06 HISTORY — DX: Nausea with vomiting, unspecified: Z98.890

## 2021-11-06 HISTORY — DX: Nausea with vomiting, unspecified: R11.2

## 2021-11-06 LAB — TYPE AND SCREEN
ABO/RH(D): A POS
Antibody Screen: NEGATIVE

## 2021-11-06 SURGERY — DILATION AND EVACUATION, UTERUS
Anesthesia: General | Site: Vagina

## 2021-11-06 MED ORDER — ORAL CARE MOUTH RINSE: 15.0000 mL | Freq: Once | OROMUCOSAL | Status: AC

## 2021-11-06 MED ORDER — LIDOCAINE 2% (20 MG/ML) 5 ML SYRINGE
INTRAMUSCULAR | Status: DC | PRN
  Administered 2021-11-06: 80 mg via INTRAVENOUS

## 2021-11-06 MED ORDER — DOXYCYCLINE HYCLATE 100 MG IV SOLR
200.0000 mg | INTRAVENOUS | Status: AC
  Administered 2021-11-06: 200 mg via INTRAVENOUS
  Filled 2021-11-06: qty 200

## 2021-11-06 MED ORDER — DEXAMETHASONE SODIUM PHOSPHATE 10 MG/ML IJ SOLN
INTRAMUSCULAR | Status: AC
  Filled 2021-11-06: qty 1

## 2021-11-06 MED ORDER — KETOROLAC TROMETHAMINE 30 MG/ML IJ SOLN
INTRAMUSCULAR | Status: AC
  Administered 2021-11-06: 30 mg
  Filled 2021-11-06: qty 1

## 2021-11-06 MED ORDER — KETOROLAC TROMETHAMINE 30 MG/ML IJ SOLN
INTRAMUSCULAR | Status: AC
  Filled 2021-11-06: qty 1

## 2021-11-06 MED ORDER — OXYCODONE-ACETAMINOPHEN 5-325 MG PO TABS: 1.0000 | ORAL_TABLET | Freq: Four times a day (QID) | ORAL | 0 refills | Status: DC | PRN

## 2021-11-06 MED ORDER — ROCURONIUM BROMIDE 10 MG/ML (PF) SYRINGE
PREFILLED_SYRINGE | INTRAVENOUS | Status: AC
  Filled 2021-11-06: qty 10

## 2021-11-06 MED ORDER — OXYCODONE-ACETAMINOPHEN 5-325 MG PO TABS
1.0000 | ORAL_TABLET | Freq: Once | ORAL | Status: AC
  Administered 2021-11-06: 1 via ORAL

## 2021-11-06 MED ORDER — LACTATED RINGERS IV SOLN: INTRAVENOUS | Status: DC

## 2021-11-06 MED ORDER — CHLORHEXIDINE GLUCONATE 0.12 % MT SOLN
OROMUCOSAL | Status: AC
  Administered 2021-11-06: 15 mL via OROMUCOSAL
  Filled 2021-11-06: qty 15

## 2021-11-06 MED ORDER — MISOPROSTOL 200 MCG PO TABS
1000.0000 ug | ORAL_TABLET | Freq: Once | ORAL | Status: AC
  Administered 2021-11-06: 1000 ug via RECTAL
  Filled 2021-11-06: qty 5

## 2021-11-06 MED ORDER — MIDAZOLAM HCL 5 MG/5ML IJ SOLN
INTRAMUSCULAR | Status: DC | PRN
  Administered 2021-11-06: 2 mg via INTRAVENOUS

## 2021-11-06 MED ORDER — FENTANYL CITRATE (PF) 100 MCG/2ML IJ SOLN
25.0000 ug | INTRAMUSCULAR | Status: DC | PRN
  Administered 2021-11-06: 25 ug via INTRAVENOUS
  Administered 2021-11-06 (×2): 50 ug via INTRAVENOUS

## 2021-11-06 MED ORDER — MIDAZOLAM HCL 2 MG/2ML IJ SOLN
INTRAMUSCULAR | Status: AC
  Filled 2021-11-06: qty 2

## 2021-11-06 MED ORDER — LIDOCAINE 2% (20 MG/ML) 5 ML SYRINGE
INTRAMUSCULAR | Status: AC
  Filled 2021-11-06: qty 5

## 2021-11-06 MED ORDER — ACETAMINOPHEN 500 MG PO TABS
1000.0000 mg | ORAL_TABLET | Freq: Once | ORAL | Status: AC
  Administered 2021-11-06: 1000 mg via ORAL
  Filled 2021-11-06: qty 2

## 2021-11-06 MED ORDER — FENTANYL CITRATE (PF) 100 MCG/2ML IJ SOLN
INTRAMUSCULAR | Status: AC
  Filled 2021-11-06: qty 2

## 2021-11-06 MED ORDER — FENTANYL CITRATE (PF) 100 MCG/2ML IJ SOLN
INTRAMUSCULAR | Status: AC
  Administered 2021-11-06: 25 ug via INTRAVENOUS
  Filled 2021-11-06: qty 2

## 2021-11-06 MED ORDER — PROPOFOL 10 MG/ML IV BOLUS
INTRAVENOUS | Status: DC | PRN
  Administered 2021-11-06: 40 mg via INTRAVENOUS
  Administered 2021-11-06: 160 mg via INTRAVENOUS

## 2021-11-06 MED ORDER — CHLORHEXIDINE GLUCONATE 0.12 % MT SOLN
15.0000 mL | Freq: Once | OROMUCOSAL | Status: AC
Start: 2021-11-06 — End: 2021-11-06

## 2021-11-06 MED ORDER — IBUPROFEN 600 MG PO TABS: 600.0000 mg | ORAL_TABLET | Freq: Four times a day (QID) | ORAL | 2 refills | Status: DC | PRN

## 2021-11-06 MED ORDER — FENTANYL CITRATE (PF) 250 MCG/5ML IJ SOLN
INTRAMUSCULAR | Status: AC
  Filled 2021-11-06: qty 5

## 2021-11-06 MED ORDER — KETOROLAC TROMETHAMINE 30 MG/ML IJ SOLN
INTRAMUSCULAR | Status: DC | PRN
  Administered 2021-11-06: 30 mg via INTRAVENOUS

## 2021-11-06 MED ORDER — OXYCODONE-ACETAMINOPHEN 5-325 MG PO TABS
ORAL_TABLET | ORAL | Status: AC
  Filled 2021-11-06: qty 1

## 2021-11-06 MED ORDER — DEXAMETHASONE SODIUM PHOSPHATE 10 MG/ML IJ SOLN
INTRAMUSCULAR | Status: DC | PRN
  Administered 2021-11-06: 10 mg via INTRAVENOUS

## 2021-11-06 MED ORDER — POVIDONE-IODINE 10 % EX SWAB: 2.0000 | Freq: Once | CUTANEOUS | Status: DC

## 2021-11-06 MED ORDER — CHLOROPROCAINE HCL 1 % IJ SOLN
INTRAMUSCULAR | Status: AC
  Filled 2021-11-06: qty 30

## 2021-11-06 MED ORDER — ONDANSETRON HCL 4 MG/2ML IJ SOLN
INTRAMUSCULAR | Status: DC | PRN
  Administered 2021-11-06: 4 mg via INTRAVENOUS

## 2021-11-06 MED ORDER — 0.9 % SODIUM CHLORIDE (POUR BTL) OPTIME
TOPICAL | Status: DC | PRN
  Administered 2021-11-06: 1000 mL

## 2021-11-06 MED ORDER — PROPOFOL 10 MG/ML IV BOLUS
INTRAVENOUS | Status: AC
Start: 2021-11-06 — End: ?
  Filled 2021-11-06: qty 20

## 2021-11-06 MED ORDER — ONDANSETRON HCL 4 MG/2ML IJ SOLN: 4.0000 mg | Freq: Once | INTRAMUSCULAR | Status: DC | PRN

## 2021-11-06 MED ORDER — ONDANSETRON HCL 4 MG/2ML IJ SOLN
INTRAMUSCULAR | Status: AC
  Filled 2021-11-06: qty 2

## 2021-11-06 SURGICAL SUPPLY — 25 items
CATH ROBINSON RED A/P 16FR (CATHETERS) ×2 IMPLANT
CONT SPEC 4OZ CLIKSEAL STRL BL (MISCELLANEOUS) ×1 IMPLANT
FILTER UTR ASPR ASSEMBLY (MISCELLANEOUS) ×2 IMPLANT
GLOVE BIOGEL PI IND STRL 6.5 (GLOVE) ×1 IMPLANT
GLOVE BIOGEL PI IND STRL 7.0 (GLOVE) ×1 IMPLANT
GLOVE BIOGEL PI INDICATOR 6.5 (GLOVE) ×1
GLOVE BIOGEL PI INDICATOR 7.0 (GLOVE) ×1
GLOVE SURG SS PI 6.5 STRL IVOR (GLOVE) ×2 IMPLANT
GOWN STRL REUS W/ TWL LRG LVL3 (GOWN DISPOSABLE) ×2 IMPLANT
GOWN STRL REUS W/TWL LRG LVL3 (GOWN DISPOSABLE) ×2
HOSE CONNECTING 18IN BERKELEY (TUBING) ×2 IMPLANT
KIT BERKELEY 1ST TRI 3/8 NO TR (MISCELLANEOUS) ×2 IMPLANT
KIT BERKELEY 1ST TRIMESTER 3/8 (MISCELLANEOUS) ×2 IMPLANT
NS IRRIG 1000ML POUR BTL (IV SOLUTION) ×1 IMPLANT
PACK VAGINAL MINOR WOMEN LF (CUSTOM PROCEDURE TRAY) ×2 IMPLANT
PAD OB MATERNITY 4.3X12.25 (PERSONAL CARE ITEMS) ×2 IMPLANT
SET BERKELEY SUCTION TUBING (SUCTIONS) ×2 IMPLANT
SPIKE FLUID TRANSFER (MISCELLANEOUS) ×1 IMPLANT
TOWEL GREEN STERILE FF (TOWEL DISPOSABLE) ×4 IMPLANT
UNDERPAD 30X36 HEAVY ABSORB (UNDERPADS AND DIAPERS) ×2 IMPLANT
VACURETTE 10 RIGID CVD (CANNULA) IMPLANT
VACURETTE 6 ASPIR F TIP BERK (CANNULA) IMPLANT
VACURETTE 7MM CVD STRL WRAP (CANNULA) IMPLANT
VACURETTE 8 RIGID CVD (CANNULA) IMPLANT
VACURETTE 9 RIGID CVD (CANNULA) ×1 IMPLANT

## 2021-11-06 NOTE — Progress Notes (Signed)
Dr. Elly Modena at bedside at 1200 made aware of patient passing small clot MD explained that was to be excepted . -

## 2021-11-06 NOTE — Anesthesia Procedure Notes (Signed)
Procedure Name: LMA Insertion Date/Time: 11/06/2021 10:36 AM  Performed by: Maude Leriche, CRNAPre-anesthesia Checklist: Patient identified, Emergency Drugs available, Suction available and Patient being monitored Patient Re-evaluated:Patient Re-evaluated prior to induction Oxygen Delivery Method: Circle system utilized Preoxygenation: Pre-oxygenation with 100% oxygen Induction Type: IV induction LMA: LMA inserted LMA Size: 4.0 Number of attempts: 1 Placement Confirmation: positive ETCO2 and breath sounds checked- equal and bilateral Tube secured with: Tape Dental Injury: Teeth and Oropharynx as per pre-operative assessment

## 2021-11-06 NOTE — Op Note (Signed)
Felicia Davila PROCEDURE DATE: 11/06/2021  PREOPERATIVE DIAGNOSIS: Retained products of conception. POSTOPERATIVE DIAGNOSIS: The same. PROCEDURE:     Dilation and Evacuation. SURGEON:  Dr. Mora Bellman  INDICATIONS: 31 y.o. [id number] with retained products of conception, needing surgical completion.  Risks of surgery were discussed with the patient including but not limited to: bleeding which may require transfusion; infection which may require antibiotics; injury to uterus or surrounding organs;need for additional procedures including laparotomy or laparoscopy; possibility of intrauterine scarring which may impair future fertility; and other postoperative/anesthesia complications. Written informed consent was obtained.    FINDINGS:  A 9-week size anteverted, moderate amounts of products of conception, specimen sent to pathology.  ANESTHESIA:    Monitored intravenous sedation INTRAVENOUS FLUIDS:  800 ml of LR ESTIMATED BLOOD LOSS:  10 ml. SPECIMENS:  Products of conception sent to pathology COMPLICATIONS:  None immediate.  PROCEDURE DETAILS:  The patient received intravenous antibiotics while in the preoperative area.  She was then taken to the operating room where general anesthesia was administered and was found to be adequate.  After an adequate timeout was performed, she was placed in the dorsal lithotomy position and examined; then prepped and draped in the sterile manner.   Her bladder was catheterized for an unmeasured amount of clear, yellow urine. A vaginal speculum was then placed in the patient's vagina and a single tooth tenaculum was applied to the anterior lip of the cervix.  The cervix was gently dilated to accommodate a 9 mm suction curette that was gently advanced to the uterine fundus.  The suction device was then activated and curette slowly rotated to clear the uterus of products of conception.  A sharp curettage was then performed to confirm complete emptying of the uterus.  There was minimal bleeding noted and the tenaculum removed with good hemostasis noted.   All instruments were removed from the patient's vagina. The patient tolerated the procedure well and was taken to the recovery area awake, and in stable condition.  The patient will be discharged to home as per PACU criteria.  Routine postoperative instructions given.  She was prescribed Percocet, Ibuprofen and Colace.  She will follow up in the clinic in 2 weeks for postoperative evaluation.

## 2021-11-06 NOTE — Transfer of Care (Signed)
Immediate Anesthesia Transfer of Care Note  Patient: Felicia Davila  Procedure(s) Performed: DILATATION AND EVACUATION (Vagina )  Patient Location: PACU  Anesthesia Type:General  Level of Consciousness: awake, alert  and oriented  Airway & Oxygen Therapy: Patient Spontanous Breathing  Post-op Assessment: Report given to RN, Post -op Vital signs reviewed and stable, Patient moving all extremities X 4 and Patient able to stick tongue midline  Post vital signs: Reviewed  Last Vitals:  Vitals Value Taken Time  BP 120/90 11/06/21 1115  Temp 98.6   Pulse 82 11/06/21 1116  Resp 18 11/06/21 1116  SpO2 100 % 11/06/21 1116  Vitals shown include unvalidated device data.  Last Pain:  Vitals:   11/06/21 0956  TempSrc:   PainSc: 7          Complications: No notable events documented.

## 2021-11-06 NOTE — Anesthesia Postprocedure Evaluation (Signed)
Anesthesia Post Note  Patient: Felicia Davila  Procedure(s) Performed: DILATATION AND EVACUATION (Vagina )     Patient location during evaluation: PACU Anesthesia Type: General Level of consciousness: awake and alert and awake Pain management: pain level controlled Vital Signs Assessment: post-procedure vital signs reviewed and stable Respiratory status: spontaneous breathing, nonlabored ventilation, respiratory function stable and patient connected to nasal cannula oxygen Cardiovascular status: stable and blood pressure returned to baseline Postop Assessment: no apparent nausea or vomiting Anesthetic complications: no   No notable events documented.  Last Vitals:  Vitals:   11/06/21 1135 11/06/21 1200  BP: 130/86 102/71  Pulse: 61 70  Resp: 13 18  Temp:  (!) 36.4 C  SpO2: 100% 99%    Last Pain:  Vitals:   11/06/21 1200  TempSrc:   PainSc: [place]

## 2021-11-06 NOTE — Anesthesia Preprocedure Evaluation (Addendum)
Anesthesia Evaluation  Patient identified by MRN, date of birth, ID band Patient awake    Reviewed: Allergy & Precautions, NPO status , Patient's Chart, lab work & pertinent test results  History of Anesthesia Complications (+) PONV and history of anesthetic complications  Airway Mallampati: II  TM Distance: >3 FB Neck ROM: Full    Dental  (+) Teeth Intact, Dental Advisory Given   Pulmonary former smoker,    Pulmonary exam normal breath sounds clear to auscultation       Cardiovascular negative cardio ROS Normal cardiovascular exam Rhythm:Regular Rate:Normal     Neuro/Psych PSYCHIATRIC DISORDERS Anxiety Depression negative neurological ROS     GI/Hepatic negative GI ROS, Neg liver ROS,   Endo/Other  Lupus  Renal/GU negative Renal ROS     Musculoskeletal negative musculoskeletal ROS (+)   Abdominal   Peds  Hematology negative hematology ROS (+)   Anesthesia Other Findings Day of surgery medications reviewed with the patient.  Reproductive/Obstetrics Retained POC, MAB                            Anesthesia Physical Anesthesia Plan  ASA: 2  Anesthesia Plan: General   Post-op Pain Management: Tylenol PO (pre-op)*   Induction: Intravenous  PONV Risk Score and Plan: 4 or greater and Midazolam, Dexamethasone and Ondansetron  Airway Management Planned: LMA  Additional Equipment:   Intra-op Plan:   Post-operative Plan: Extubation in OR  Informed Consent: I have reviewed the patients History and Physical, chart, labs and discussed the procedure including the risks, benefits and alternatives for the proposed anesthesia with the patient or authorized representative who has indicated his/her understanding and acceptance.     Dental advisory given  Plan Discussed with: CRNA  Anesthesia Plan Comments:        Anesthesia Quick Evaluation

## 2021-11-07 ENCOUNTER — Encounter (HOSPITAL_COMMUNITY): Payer: Self-pay | Admitting: Obstetrics and Gynecology

## 2021-11-09 LAB — SURGICAL PATHOLOGY

## 2021-11-10 ENCOUNTER — Inpatient Hospital Stay (HOSPITAL_COMMUNITY)
Admission: AD | Admit: 2021-11-10 | Discharge: 2021-11-10 | Disposition: A | Discharge status: Home / Self Care | Payer: Medicaid Other | Attending: Obstetrics and Gynecology | Admitting: Obstetrics and Gynecology

## 2021-11-10 ENCOUNTER — Telehealth: Payer: Self-pay | Admitting: Lactation Services

## 2021-11-10 ENCOUNTER — Inpatient Hospital Stay (HOSPITAL_COMMUNITY): Payer: Medicaid Other

## 2021-11-10 ENCOUNTER — Encounter (HOSPITAL_COMMUNITY): Payer: Self-pay | Admitting: Obstetrics and Gynecology

## 2021-11-10 DIAGNOSIS — R9389 Abnormal findings on diagnostic imaging of other specified body structures: Secondary | ICD-10-CM | POA: Insufficient documentation

## 2021-11-10 DIAGNOSIS — R102 Pelvic and perineal pain: Secondary | ICD-10-CM | POA: Diagnosis not present

## 2021-11-10 DIAGNOSIS — R1032 Left lower quadrant pain: Secondary | ICD-10-CM | POA: Insufficient documentation

## 2021-11-10 DIAGNOSIS — Z9889 Other specified postprocedural states: Secondary | ICD-10-CM

## 2021-11-10 DIAGNOSIS — Z3A10 10 weeks gestation of pregnancy: Secondary | ICD-10-CM | POA: Insufficient documentation

## 2021-11-10 DIAGNOSIS — N83202 Unspecified ovarian cyst, left side: Secondary | ICD-10-CM | POA: Insufficient documentation

## 2021-11-10 DIAGNOSIS — O3481 Maternal care for other abnormalities of pelvic organs, first trimester: Secondary | ICD-10-CM | POA: Insufficient documentation

## 2021-11-10 DIAGNOSIS — O26891 Other specified pregnancy related conditions, first trimester: Secondary | ICD-10-CM | POA: Insufficient documentation

## 2021-11-10 LAB — URINALYSIS, ROUTINE W REFLEX MICROSCOPIC
Bilirubin Urine: NEGATIVE
Glucose, UA: NEGATIVE mg/dL
Ketones, ur: NEGATIVE mg/dL
Nitrite: NEGATIVE
Protein, ur: NEGATIVE mg/dL
Specific Gravity, Urine: 1.006 (ref 1.005–1.030)
pH: 7 (ref 5.0–8.0)

## 2021-11-10 MED ORDER — OXYCODONE-ACETAMINOPHEN 5-325 MG PO TABS: 1.0000 | ORAL_TABLET | Freq: Four times a day (QID) | ORAL | 0 refills | Status: DC | PRN

## 2021-11-10 MED ORDER — OXYCODONE HCL 5 MG PO TABS
10.0000 mg | ORAL_TABLET | Freq: Once | ORAL | Status: AC
  Administered 2021-11-10: 10 mg via ORAL
  Filled 2021-11-10: qty 2

## 2021-11-10 NOTE — Telephone Encounter (Signed)
Patient called in for pain after D&E. Attempted to call her back and phone when straight to voicemail. Asked patient to call the office at her convenience.   Called patient's mother's number and no answer at this time.

## 2021-11-10 NOTE — Telephone Encounter (Signed)
Patient called in and reprots she is having more pain since surgery. She reports more localized on the left side inside the uterus. She reports pain when walking is shooting down her leg.   She reports increased pain with sitting, voiding or stooling. She was able to void or stool this morning, but with pain.   She is taking Ibuprofen every 6 hours. She is out of Narcotics. She is taking Tylenol every 4 hours ( 2 extra strength) reviewed she needs to take more like every 6 hours.   She has been working and went to [facility] and left due to wait.   She reports she was passing 3 baseball sized clots after surgery then passed 3-4 quarter sized to golf ball for a few days. Pain worsened when bleeding stopped. She is having a black discharge on a panty liner 3 times a day and reports has a foul odor.   She reports she feels generally unwell. She has not checked her temperature. She is having chills and sweats at night. Drinking and eating ok.   Reviewed with Dr. Dione Plover who recommended she follow up in MAU for evaluation. Patient informed and she reports she will go in now.

## 2021-11-10 NOTE — MAU Note (Signed)
...  Felicia Davila is a 31 y.o. at 32w5dhere in MAU reporting: D&C performed on 7/14. She reports the day she went home she passed three softball sized blood clots then her bleeding ceased. She reports the next day she passed three quarter sized blood clots and since then she has had no bleeding. She reports yesterday she noted dark brown to black discharge when she wipes and is still experiencing this. She is also endorsing left anterior pelvic pain that is constant and dull. She reports she will randomly experience a sharp pain on this side with walking. 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 She reports walking and sitting increases her left pelvic pain. Denies blood clots. Denies vaginal odors and vaginal itching.   Last doses Iburprofen 600 mg and 1000 mg of Tylenol at 1430 today.  Used last dose of Percocet yesterday morning.  She reports she received cytotec after surgery as well.   Onset of complaint: Yesterday morning  Pain score:  8/10 lower abdomen 9/10 left lower anterior pelvis  Lab orders placed from triage:  UA

## 2021-11-10 NOTE — MAU Provider Note (Signed)
History     CSN: [id number]  Arrival date and time: 11/10/21 1610   Event Date/Time   First Provider Initiated Contact with Patient 11/10/21 1650      Chief Complaint  Patient presents with   Abdominal Pain   Pelvic Pain   Vaginal Discharge   Felicia Davila is a 31 y.o.  who is S/P DnC on July 14th who receives care at [facility].  She presents today for Abdominal Pain. She states the pain started yesterday and has been constant.  She reports the pain is improved with rest, but worse with urination/ambulation. She states the pain is located "close to where my ectopic was" and remarks that it feels similar too.  She states with urination and bowel movements she experiences more pressure; unsure if sitting or the activity itself is causing the pain.  Patient reports she has taken ibuprofen and tylenol at 1430 with some relief of symptoms.  She states she returned to work yesterday. She states she was having some increased bleeding, but that has since improved.  She denies current bleeding, but reports some "tan" colored discharge.  She questions if cytotec, after a DnC, is usual practice.     OB History     Gravida  4   Para  2   Term  1   Preterm  1   AB  1   Living  2      SAB      IAB      Ectopic  1   Multiple      Live Births  2           Past Medical History:  Diagnosis Date   Anxiety    Depression    Lupus (HCC)    PONV (postoperative nausea and vomiting)     Past Surgical History:  Procedure Laterality Date   CESAREAN SECTION     CHOLECYSTECTOMY     DILATION AND EVACUATION N/A 11/06/2021   Procedure: DILATATION AND EVACUATION;  Surgeon: Mora Bellman, MD;  Location: [place];  Service: Gynecology;  Laterality: N/A;   FOREARM SURGERY Left     Family History  Problem Relation Age of Onset   Healthy Mother    Lupus Father     Social History   Tobacco Use   Smoking status: Former   Smokeless tobacco: Never  Scientific laboratory technician Use:  Every day   Substances: Nicotine  Substance Use Topics   Alcohol use: No   Drug use: No    Allergies:  Allergies  Allergen Reactions   Morphine And Related Anaphylaxis, Rash and Other (See Comments)    Per extensive conversation with patient, surgical attending and surgical PA, patient does NOT have an anaphylactic reaction to morphine. Has taken for short periods in the past without issues. Pins and needle feeling in extremities. (??)     Nitrofurantoin Nausea And Vomiting and Other (See Comments)    Also, "felt "weird in the head"    Fentanyl Other (See Comments)    "Makes Pt hot and tingly"   Hydrocodone Other (See Comments)    Pins and needle feeling in extremities    Medications Prior to Admission  Medication Sig Dispense Refill Last Dose   acetaminophen (TYLENOL) 500 MG tablet Take 1,000 mg by mouth 2 (two) times daily as needed (cramping).      ibuprofen (ADVIL) 600 MG tablet Take 1 tablet (600 mg total) by mouth every 6 (six) hours as needed.  30 tablet 2    oxyCODONE-acetaminophen (PERCOCET/ROXICET) 5-325 MG tablet Take 1 tablet by mouth every 6 (six) hours as needed. 10 tablet 0     Review of Systems  Constitutional:  Negative for chills and fever.  Gastrointestinal:  Positive for abdominal pain. Negative for constipation, diarrhea, nausea and vomiting.  Genitourinary:  Positive for vaginal bleeding (Old blood). Negative for difficulty urinating, dysuria and vaginal discharge.  Neurological:  Positive for dizziness and headaches (6/10-frontal; "feels like I am about to get a migraine...could be stress."). Negative for light-headedness.   Physical Exam   Blood pressure 103/69, pulse 89, temperature 98.6 F (37 C), temperature source Oral, resp. rate 14, height '5\' 7"'$  (1.702 m), weight 72.6 kg, last menstrual period 09/07/2021, SpO2 100 %, unknown if currently breastfeeding.  Physical Exam Vitals reviewed.  Constitutional:      Appearance: Normal appearance. She is  well-developed.  HENT:     Head: Normocephalic and atraumatic.  Eyes:     Conjunctiva/sclera: Conjunctivae normal.  Cardiovascular:     Rate and Rhythm: Normal rate.  Pulmonary:     Effort: Pulmonary effort is normal. No respiratory distress.  Abdominal:     Tenderness: There is abdominal tenderness in the left lower quadrant.    Musculoskeletal:        General: Normal range of motion.  Skin:    General: Skin is warm and dry.  Neurological:     Mental Status: She is alert and oriented to person, place, and time.  Psychiatric:        Mood and Affect: Mood normal.        Behavior: Behavior normal.     MAU Course  Procedures Results for orders placed or performed during the hospital encounter of 11/10/21 (from the past 24 hour(s))  Urinalysis, Routine w reflex microscopic Urine, Clean Catch     Status: Abnormal   Collection Time: 11/10/21  4:30 PM  Result Value Ref Range   Color, Urine YELLOW YELLOW   APPearance CLEAR CLEAR   Specific Gravity, Urine 1.006 1.005 - 1.030   pH 7.0 5.0 - 8.0   Glucose, UA NEGATIVE NEGATIVE mg/dL   Hgb urine dipstick MODERATE (A) NEGATIVE   Bilirubin Urine NEGATIVE NEGATIVE   Ketones, ur NEGATIVE NEGATIVE mg/dL   Protein, ur NEGATIVE NEGATIVE mg/dL   Nitrite NEGATIVE NEGATIVE   Leukocytes,Ua TRACE (A) NEGATIVE   RBC / HPF 0-5 0 - 5 RBC/hpf   WBC, UA 0-5 0 - 5 WBC/hpf   Bacteria, UA RARE (A) NONE SEEN   Squamous Epithelial / LPF 6-10 0 - 5   US PELVIC COMPLETE WITH TRANSVAGINAL  Result Date: 11/10/2021 CLINICAL DATA:  Left-sided pain. Dilatation and evacuation 11/06/2021. Left-sided ectopic pregnancy in April status post methotrexate. EXAM: TRANSABDOMINAL AND TRANSVAGINAL ULTRASOUND OF PELVIS TECHNIQUE: Both transabdominal and transvaginal ultrasound examinations of the pelvis were performed. Transabdominal technique was performed for global imaging of the pelvis including uterus, ovaries, adnexal regions, and pelvic cul-de-sac. It was  necessary to proceed with endovaginal exam following the transabdominal exam to visualize the ovaries and endometrium. COMPARISON:  Pelvic ultrasound 11/02/2021 FINDINGS: Uterus Measurements: 9.1 x 3.8 x 5.2 cm = volume: 94 mL. The uterus is anteverted. No fibroids or other mass visualized. Endometrium Thickness: 10 mm about the fundus, 14 mm in the lower uterine segment. The endometrial cavity is distended with heterogeneous avascular predominantly hypoechoic material. The amount of endometrial blood flow has diminished, however there may be minimal flow demonstrated in the  lower uterine segment, images 114 and 117. Right ovary Measurements: 3.2 x 1.6 x 2 cm = volume: 5.4 mL. Multiple follicles. No cyst or solid lesion. Blood flow is demonstrated. No adnexal mass. Left ovary Measurements: 3.5 x 2.7 x 2.7 cm = volume: 13.4 mL. There is a 2.9 cm anechoic simple cyst that is new from prior exam. Additional ovarian follicles. Ovarian blood flow is demonstrated. Other findings No abnormal free fluid. IMPRESSION: 1. Decreased endometrial thickening, however there is persistent heterogeneous primarily hypoechoic material within the endometrial canal. Thickening of the lower uterine segment at 14 mm. This is primarily avascular, however there may be minimal vascularity about the endometrial products in the lower uterine segment. The possibility of persistent retained products of conception is not excluded. 2. Left ovarian cyst measuring 2.9 cm. No dedicated follow-up is needed. Electronically Signed   By: Keith Rake M.D.   On: 11/10/2021 18:01    MDM Exam Pain Medication Labs: UA Ultrasound Prescription Assessment and Plan  31 year old, [id number]  Abdominal Pain-LLQ S/P DnC   -Exam performed and findings discussed.  -Reviewed POC with patient. -Informed that provider not sure of usual practice after DnC.  However, after discussed postop bleeding and reassured that cytotec would be very much appropriate  if bleeding was increased.  -Patient offered and accepts pain medication.  States husband can come in if necessary. -Oxycodone ordered.  -Plan to send for [place] to assess.  Maryann Conners 11/10/2021, 4:50 PM   Reassessment (6:11 PM)  -Results as above. -Patient reports pain improved with oxycodone dosing. -Reviewed findings.  Discussed how some vascularity in LUS, but lining overall appropriate. -Bleeding precautions reviewed. -Informed of newly noted ovarian cyst.  Educated on causes, symptoms, and resolution. Informed that this could be contributing or major source of pain. -Discussed relief measures including warm compresses and pain medication. -Encouraged to rest.  Rx for percocet refilled. -Patient off work until Friday.  Given note to be out for weekend if she desires. -Discussed follow up in office.  -Encouraged to call primary office or return to MAU if symptoms worsen or with the onset of new symptoms. -Discharged to home in stable condition.  Maryann Conners MSN, CNM Advanced Practice Provider, Center for [employer]

## 2021-11-26 ENCOUNTER — Inpatient Hospital Stay (HOSPITAL_COMMUNITY)
Admission: AD | Admit: 2021-11-26 | Discharge: 2021-11-26 | Discharge status: Against Medical Advice | Payer: Medicaid Other | Attending: Obstetrics and Gynecology | Admitting: Obstetrics and Gynecology

## 2021-11-26 ENCOUNTER — Inpatient Hospital Stay (HOSPITAL_COMMUNITY): Payer: Medicaid Other

## 2021-11-26 ENCOUNTER — Other Ambulatory Visit: Payer: Self-pay

## 2021-11-26 DIAGNOSIS — O209 Hemorrhage in early pregnancy, unspecified: Secondary | ICD-10-CM | POA: Insufficient documentation

## 2021-11-26 DIAGNOSIS — R1030 Lower abdominal pain, unspecified: Secondary | ICD-10-CM | POA: Diagnosis present

## 2021-11-26 DIAGNOSIS — O26891 Other specified pregnancy related conditions, first trimester: Secondary | ICD-10-CM | POA: Insufficient documentation

## 2021-11-26 DIAGNOSIS — O219 Vomiting of pregnancy, unspecified: Secondary | ICD-10-CM | POA: Insufficient documentation

## 2021-11-26 DIAGNOSIS — Z3A13 13 weeks gestation of pregnancy: Secondary | ICD-10-CM | POA: Diagnosis not present

## 2021-11-26 LAB — CBC WITH DIFFERENTIAL/PLATELET
Abs Immature Granulocytes: 0.01 10*3/uL (ref 0.00–0.07)
Basophils Absolute: 0 10*3/uL (ref 0.0–0.1)
Basophils Relative: 1 %
Eosinophils Absolute: 0 10*3/uL (ref 0.0–0.5)
Eosinophils Relative: 1 %
HCT: 35.7 % — ABNORMAL LOW (ref 36.0–46.0)
Hemoglobin: 11.8 g/dL — ABNORMAL LOW (ref 12.0–15.0)
Immature Granulocytes: 0 %
Lymphocytes Relative: 24 %
Lymphs Abs: 1.2 10*3/uL (ref 0.7–4.0)
MCH: 30.3 pg (ref 26.0–34.0)
MCHC: 33.1 g/dL (ref 30.0–36.0)
MCV: 91.5 fL (ref 80.0–100.0)
Monocytes Absolute: 0.5 10*3/uL (ref 0.1–1.0)
Monocytes Relative: 10 %
Neutro Abs: 3.3 10*3/uL (ref 1.7–7.7)
Neutrophils Relative %: 64 %
Platelets: 229 10*3/uL (ref 150–400)
RBC: 3.9 MIL/uL (ref 3.87–5.11)
RDW: 12.2 % (ref 11.5–15.5)
WBC: 5.1 10*3/uL (ref 4.0–10.5)
nRBC: 0 % (ref 0.0–0.2)

## 2021-11-26 LAB — URINALYSIS, ROUTINE W REFLEX MICROSCOPIC
Bilirubin Urine: NEGATIVE
Glucose, UA: NEGATIVE mg/dL
Hgb urine dipstick: NEGATIVE
Ketones, ur: NEGATIVE mg/dL
Leukocytes,Ua: NEGATIVE
Nitrite: NEGATIVE
Protein, ur: NEGATIVE mg/dL
Specific Gravity, Urine: 1.009 (ref 1.005–1.030)
pH: 7 (ref 5.0–8.0)

## 2021-11-26 LAB — WET PREP, GENITAL
Clue Cells Wet Prep HPF POC: NONE SEEN
Sperm: NONE SEEN
Trich, Wet Prep: NONE SEEN
WBC, Wet Prep HPF POC: <10 (ref <10)
Yeast Wet Prep HPF POC: NONE SEEN

## 2021-11-26 NOTE — MAU Note (Signed)
Pt not in lobby x2

## 2021-11-26 NOTE — MAU Note (Signed)
Felicia Davila is a 31 y.o. here in MAU reporting: had a D&C on July 14. Was seen in MAU shortly after for pain. States pain stopped and now restarted about 3-4 days ago. Bleeding turned brown and has seen yellow discharge for the past 3-4 days. Slight odor but no itching.  Onset of complaint: ongoing  Pain score: 8/10  Vitals:   11/26/21 1638  BP: 115/84  Pulse: 89  Resp: 16  Temp: 99.2 F (37.3 C)  SpO2: 100%     Lab orders placed from triage: none

## 2021-11-26 NOTE — MAU Provider Note (Signed)
Chief Complaint:  Abdominal Pain   HPI: Felicia Davila is a 31 y.o. [id number] at [id number] presents to maternity admissions reporting lower abdominal pain/cramping x3-4 days. She is s/p D&C on 7/14 with return to MAU for cramping on 11/10/21. Still having some bleeding, is not sure if it ever stopped. Currently very light dark brown with yellow discharge that occasionally has a smell. No vaginal discomfort otherwise. Also c/o increased nausea/vomiting for the past 3 days and is worried she could be pregnant again [facility]  Pregnancy Course: On 7/18 visit, pt had an ultrasound showing retained POC and was given 10028m cytotec. Reports she did not have an increase in bleeding after, bleeding actually decreased from the dark red she was having to the dark brown she is having now.  Past Medical History:  Diagnosis Date   Anxiety    Depression    Lupus (HCC)    PONV (postoperative nausea and vomiting)    OB History  Gravida Para Term Preterm AB Living  '4 2 1 1 1 2  '$ SAB IAB Ectopic Multiple Live Births      1   2    # Outcome Date GA Lbr Len/2nd Weight Sex Delivery Anes PTL Lv  4 Gravida           3 Preterm 10/04/14     CS-Unspec   LIV  2 Term 04/05/13     CS-Unspec   LIV  1 Ectopic            Past Surgical History:  Procedure Laterality Date   CESAREAN SECTION     CHOLECYSTECTOMY     DILATION AND EVACUATION N/A 11/06/2021   Procedure: DILATATION AND EVACUATION;  Surgeon: CoMora BellmanMD;  [place] Service: [place]cology;  Laterality: N/A;   FOREARM SURGERY Left    Family History  Problem Relation Age of Onset   Healthy Mother    Lupus Father    Social History   Tobacco Use   Smoking status: Former   Smokeless tobacco: Never  VaScientific laboratory technicianse: Every day   Substances: Nicotine  Substance Use Topics   Alcohol use: No   Drug use: No   Allergies  Allergen Reactions   Morphine And Related Anaphylaxis, Rash and Other (See Comments)     Per extensive conversation with patient, surgical attending and surgical PA, patient does NOT have an anaphylactic reaction to morphine. Has taken for short periods in the past without issues. Pins and needle feeling in extremities. (??)     Nitrofurantoin Nausea And Vomiting and Other (See Comments)    Also, "felt "weird in the head"    Fentanyl Other (See Comments)    "Makes Pt hot and tingly"   Hydrocodone Other (See Comments)    Pins and needle feeling in extremities   No medications prior to admission.   I have reviewed patient's Past Medical Hx, Surgical Hx, Family Hx, Social Hx, medications and allergies.   ROS:  Pertinent items noted in HPI and remainder of comprehensive ROS otherwise negative.   Physical Exam  Patient Vitals for the past 24 hrs:  BP Temp Temp src Pulse Resp SpO2  11/26/21 1638 115/84 99.2 F (37.3 C) Oral 89 16 100 %   Physical Exam Vitals and nursing note reviewed.  Constitutional:      General: She is not in acute distress.    Appearance: She is well-developed and normal weight. She  is not ill-appearing.  HENT:     Head: Normocephalic and atraumatic.  Cardiovascular:     Rate and Rhythm: Normal rate and regular rhythm.  Pulmonary:     Effort: Pulmonary effort is normal.  Abdominal:     Tenderness: There is no abdominal tenderness.  Genitourinary:    Comments: Self-swabs collected Skin:    General: Skin is warm and dry.     Capillary Refill: Capillary refill takes less than 2 seconds.  Neurological:     Mental Status: She is alert and oriented to person, place, and time.  Psychiatric:        Mood and Affect: Mood normal.        Behavior: Behavior normal.     Labs: Results for orders placed or performed during the hospital encounter of 11/26/21 (from the past 24 hour(s))  CBC with Differential/Platelet     Status: Abnormal   Collection Time: 11/26/21  4:48 PM  Result Value Ref Range   WBC 5.1 4.0 - 10.5 K/uL   RBC 3.90 3.87 - 5.11  MIL/uL   Hemoglobin 11.8 (L) 12.0 - 15.0 g/dL   HCT 35.7 (L) 36.0 - 46.0 %   MCV 91.5 80.0 - 100.0 fL   MCH 30.3 26.0 - 34.0 pg   MCHC 33.1 30.0 - 36.0 g/dL   RDW 12.2 11.5 - 15.5 %   Platelets 229 150 - 400 K/uL   nRBC 0.0 0.0 - 0.2 %   Neutrophils Relative % 64 %   Neutro Abs 3.3 1.7 - 7.7 K/uL   Lymphocytes Relative 24 %   Lymphs Abs 1.2 0.7 - 4.0 K/uL   Monocytes Relative 10 %   Monocytes Absolute 0.5 0.1 - 1.0 K/uL   Eosinophils Relative 1 %   Eosinophils Absolute 0.0 0.0 - 0.5 K/uL   Basophils Relative 1 %   Basophils Absolute 0.0 0.0 - 0.1 K/uL   Immature Granulocytes 0 %   Abs Immature Granulocytes 0.01 0.00 - 0.07 K/uL  Wet prep, genital     Status: None   Collection Time: 11/26/21  4:56 PM  Result Value Ref Range   Yeast Wet Prep HPF POC NONE SEEN NONE SEEN   Trich, Wet Prep NONE SEEN NONE SEEN   Clue Cells Wet Prep HPF POC NONE SEEN NONE SEEN   WBC, Wet Prep HPF POC <10 <10   Sperm NONE SEEN   Urinalysis, Routine w reflex microscopic     Status: Abnormal   Collection Time: 11/26/21  4:56 PM  Result Value Ref Range   Color, Urine YELLOW YELLOW   APPearance HAZY (A) CLEAR   Specific Gravity, Urine 1.009 1.005 - 1.030   pH 7.0 5.0 - 8.0   Glucose, UA NEGATIVE NEGATIVE mg/dL   Hgb urine dipstick NEGATIVE NEGATIVE   Bilirubin Urine NEGATIVE NEGATIVE   Ketones, ur NEGATIVE NEGATIVE mg/dL   Protein, ur NEGATIVE NEGATIVE mg/dL   Nitrite NEGATIVE NEGATIVE   Leukocytes,Ua NEGATIVE NEGATIVE   Imaging:  U/S ordered but pt left AMA  MAU Course: Orders Placed This Encounter  Procedures   Wet prep, genital   CBC with Differential/Platelet   Urinalysis, Routine w reflex microscopic   No orders of the defined types were placed in this encounter.  MDM: - Pt stable so spoke to her in triage - since she is not having excessive bleeding, the concern is more for infection than retained products of conception.  - Reviewed that this could be normal involution, menstrual  cramping, vaginal infection or UTI. Ordered wet prep/GC/CT, UA and CBC with diff. - Reviewed presentation and findings with Dr. Dione Plover who agreed another U/S would completely rule out retained POC or other abnormality. Order placed, pt called for ultrasound but had left the hospital.  - All labs normal  Assessment: Lower abdominal cramping Nausea/vomiting  Plan: Pt left AMA.  Gaylan Gerold, CNM, MSN, [place] Certified Nurse Midwife, [facility]

## 2021-11-26 NOTE — MAU Note (Signed)
Patient not in lobby x3

## 2021-11-26 NOTE — MAU Note (Signed)
Pt called for u/s. Not in lobby.

## 2021-11-27 ENCOUNTER — Encounter: Payer: Self-pay | Admitting: Obstetrics and Gynecology

## 2021-11-27 ENCOUNTER — Ambulatory Visit (INDEPENDENT_AMBULATORY_CARE_PROVIDER_SITE_OTHER): Payer: Medicaid Other | Admitting: Obstetrics and Gynecology

## 2021-11-27 VITALS — BP 119/85 | HR 79 | Temp 98.5°F | Wt 163.7 lb

## 2021-11-27 DIAGNOSIS — R350 Frequency of micturition: Secondary | ICD-10-CM

## 2021-11-27 DIAGNOSIS — R102 Pelvic and perineal pain: Secondary | ICD-10-CM

## 2021-11-27 DIAGNOSIS — Z87898 Personal history of other specified conditions: Secondary | ICD-10-CM | POA: Diagnosis not present

## 2021-11-27 DIAGNOSIS — O034 Incomplete spontaneous abortion without complication: Secondary | ICD-10-CM

## 2021-11-27 LAB — POCT URINALYSIS DIP (DEVICE)
Bilirubin Urine: NEGATIVE
Glucose, UA: NEGATIVE mg/dL
Hgb urine dipstick: NEGATIVE
Ketones, ur: NEGATIVE mg/dL
Leukocytes,Ua: NEGATIVE
Nitrite: NEGATIVE
Protein, ur: NEGATIVE mg/dL
Specific Gravity, Urine: 1.015 (ref 1.005–1.030)
Urobilinogen, UA: 0.2 mg/dL (ref 0.0–1.0)
pH: 7 (ref 5.0–8.0)

## 2021-11-27 LAB — GC/CHLAMYDIA PROBE AMP (~~LOC~~) NOT AT ARMC
Chlamydia: NEGATIVE
Comment: NEGATIVE
Comment: NORMAL
Neisseria Gonorrhea: NEGATIVE

## 2021-11-27 LAB — POCT PREGNANCY, URINE: Preg Test, Ur: NEGATIVE

## 2021-11-27 MED ORDER — KETOROLAC TROMETHAMINE 10 MG PO TABS: 10.0000 mg | ORAL_TABLET | Freq: Three times a day (TID) | ORAL | 0 refills | Status: DC | PRN

## 2021-11-27 MED ORDER — AMOXICILLIN-POT CLAVULANATE 875-125 MG PO TABS: 1.0000 | ORAL_TABLET | Freq: Two times a day (BID) | ORAL | 0 refills | Status: AC

## 2021-11-27 MED ORDER — DOXYCYCLINE HYCLATE 100 MG PO CAPS: 100.0000 mg | ORAL_CAPSULE | Freq: Two times a day (BID) | ORAL | 0 refills | Status: DC

## 2021-11-27 MED ORDER — METRONIDAZOLE 500 MG PO TABS: 500.0000 mg | ORAL_TABLET | Freq: Two times a day (BID) | ORAL | 0 refills | Status: DC

## 2021-11-27 NOTE — Progress Notes (Signed)
Obstetrics and Gynecology Visit Return Patient Evaluation  Appointment Date: 11/27/2021  Primary Care Provider: Patient, No Pcp Per  OBGYN Clinic: [facility] for [facility]  Chief Complaint: post op follow up  History of Present Illness:  Felicia Davila is a 31 y.o. Patient had a 7/14 suction d&c by Dr. Elly Modena for retained POCs after incomplete SAB after cytotec; d&c uncomplicated and she was discharged home from the pacu and negative pathology that showed villi.   Interval History: She went to the mau on 7/18 for pain and some discharge and bleeding and ? Minimal vascularity on u/s and a new 3cm cyst on the left ovary. Patient deemed stable for discharge and she says she did well until about a week ago when she started having fevers at home and lower belly pain. No vb but just some yellowish discharge. She went to the mau yesterday and had negative cbc, wet prep and gc/ct and u/a just showed blood but she lwbs b/c she was told she didn't need an u/s even though she called the clinic and was told that she probably needed one.  Pt is stable today  Review of Systems: as noted in the History of Present Illness.  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 Current Outpatient Medications  Medication Sig Dispense Refill   acetaminophen (TYLENOL) 500 MG tablet Take 1,000 mg by mouth 2 (two) times daily as needed (cramping).     ibuprofen (ADVIL) 600 MG tablet Take 1 tablet (600 mg total) by mouth every 6 (six) hours as needed. 30 tablet 2   oxyCODONE-acetaminophen (PERCOCET/ROXICET) 5-325 MG tablet Take 1-2 tablets by mouth every 6 (six) hours as needed. (Patient not taking: Reported on 11/27/2021) 10 tablet 0    No current facility-administered medications for this visit.    Allergies: is allergic to morphine and related, nitrofurantoin, fentanyl, and hydrocodone.  Physical Exam:  BP 119/85   Pulse 79   Temp 98.5 F (36.9 C)   Wt 163 lb 11.2 oz (74.3 kg)   LMP 09/07/2021 Comment: SAB  BMI 25.64 kg/m  Body mass index is 25.64 kg/m. General appearance: Well nourished, well developed female in no acute distress.  Abdomen: diffusely non tender to palpation, non distended, and no masses, hernias Neuro/Psych:  Normal mood and affect.    Labs: UPT negaive  Radiology:  TVUS done with chaperone present. No blood on probe and no CMT  Uterus: mid plane 8 x 3.5 x 3.5cm. no vascularity seen in the uterus on sagittal and transverse view. There is scant, black, homogenous fluid in the mid and lower uterine segment (not continguous) but nothing activelly welling up. ES appears to be about 7m and there is some shagginess to it but no obvious retained POCs.  RO: normal sized, small, with multiple follicles LO not seen  Bladder is full even though patient just voided  Assessment: pt stable  Plan:  1. History of fever Unsure of etiology. I told her I recommend treatment for an endometritis and UTI with augmentin and follow up next week, even though it would be unlikely to have endometritis this far out and rare to have retained POCs after an in OR suction d&c I told her if she feels worse to go to  the ER for a formal u/s evaluation Toradol PRN sent in and pt told not to take other NSAIDs with this - Urinalysis, Routine w reflex microscopic - Urine Culture  2. Increased urinary frequency Bladder full on u/s even though she just voided, that she doesn't feel that she is emptying her bladder,  she says that she goes 7-8x during the nig, too.  Will send ucx  RTC: next week  Durene Romans MD Attending Center for [employer] [facility])

## 2021-11-28 LAB — URINALYSIS, ROUTINE W REFLEX MICROSCOPIC
Bilirubin, UA: NEGATIVE
Glucose, UA: NEGATIVE
Ketones, UA: NEGATIVE
Leukocytes,UA: NEGATIVE
Nitrite, UA: NEGATIVE
Protein,UA: NEGATIVE
RBC, UA: NEGATIVE
Specific Gravity, UA: 1.007 (ref 1.005–1.030)
Urobilinogen, Ur: 0.2 mg/dL (ref 0.2–1.0)
pH, UA: 8 — ABNORMAL HIGH (ref 5.0–7.5)

## 2021-11-29 LAB — URINE CULTURE

## 2021-12-03 ENCOUNTER — Ambulatory Visit (HOSPITAL_BASED_OUTPATIENT_CLINIC_OR_DEPARTMENT_OTHER): Admission: RE | Admit: 2021-12-03 | Payer: Medicaid Other | Source: Ambulatory Visit

## 2021-12-03 ENCOUNTER — Telehealth (HOSPITAL_BASED_OUTPATIENT_CLINIC_OR_DEPARTMENT_OTHER): Payer: Self-pay

## 2021-12-03 ENCOUNTER — Other Ambulatory Visit: Payer: Self-pay | Admitting: Obstetrics and Gynecology

## 2021-12-03 DIAGNOSIS — Z9889 Other specified postprocedural states: Secondary | ICD-10-CM

## 2021-12-04 ENCOUNTER — Encounter: Payer: Self-pay | Admitting: Obstetrics and Gynecology

## 2021-12-04 ENCOUNTER — Other Ambulatory Visit: Payer: Self-pay

## 2021-12-04 ENCOUNTER — Ambulatory Visit (INDEPENDENT_AMBULATORY_CARE_PROVIDER_SITE_OTHER): Payer: Medicaid Other | Admitting: Obstetrics and Gynecology

## 2021-12-04 ENCOUNTER — Encounter: Payer: Self-pay | Admitting: Family Medicine

## 2021-12-04 VITALS — BP 117/80 | HR 83 | Temp 98.2°F | Wt 159.1 lb

## 2021-12-04 DIAGNOSIS — R102 Pelvic and perineal pain: Secondary | ICD-10-CM

## 2021-12-04 DIAGNOSIS — O034 Incomplete spontaneous abortion without complication: Secondary | ICD-10-CM

## 2021-12-04 NOTE — Progress Notes (Signed)
Obstetrics and Gynecology Visit Return Patient Evaluation  Appointment Date: 12/04/2021  Primary Care Provider: Patient, No Pcp Per  OBGYN Clinic: [facility]  Chief Complaint: f/u abdominal pain  History of Present Illness:  Felicia Davila is a 31 y.o..  She had a 7/14 suction d&c by Dr. Elly Modena for retained POCs after incomplete SAB after cytotec; d&c uncomplicated and she was discharged home from the pacu and negative pathology that showed villi. She went to the mau on 7/18 for pain and some discharge and bleeding and ? Minimal vascularity on u/s and a new 3cm cyst on the left ovary. Patient deemed stable for discharge and she says she did well until about a week ago when she started having fevers at home and lower belly pain. No vb but just some yellowish discharge. She went to the mau yesterday and had negative cbc, wet prep and gc/ct and u/a just showed blood but she lwbs b/c she was told she didn't need an u/s even though she called the clinic and was told that she probably needed one.  I saw her on 8/4 and a bedside u/s didn't show overt rPOCs and looked better vs the one she had in MAU but she was still having abdominal pain. So ucx ordered and pt put on augmentin for ?endometritis and prn toradol.  Ucx neg  Interval History: Since that time, she states that she had continued pain (stable) via mychart message so pt was set up for rpt u/s yesterday but was unable to make it. She continues to use the nsaid and apap scheduled. Pt only having spotting, no period yet.   Review of Systems:  as noted in the History of Present Illness.  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 Current  Outpatient Medications  Medication Sig Dispense Refill   amoxicillin-clavulanate (AUGMENTIN) 875-125 MG tablet Take 1 tablet by mouth 2 (two) times daily for 10 days. 20 tablet 0   ketorolac (TORADOL) 10 MG tablet Take 1 tablet (10 mg total) by mouth every 8 (eight) hours as needed. 20 tablet 0   acetaminophen (TYLENOL) 500 MG tablet Take 1,000 mg by mouth 2 (two) times daily as needed (cramping). (Patient not taking: Reported on 12/04/2021)     No current facility-administered medications for this visit.    Allergies: is allergic to morphine and related, nitrofurantoin, fentanyl, and hydrocodone.  Physical Exam:  BP 117/80   Pulse 83   Temp 98.2 F (36.8 C)   Wt 159 lb 1.6 oz (72.2 kg)   LMP 09/07/2021   SpO2 100%   Breastfeeding No   BMI 24.92 kg/m  Body mass index is 24.92 kg/m. General appearance: Well nourished, well developed female in no acute distress.  Abdomen: no peritoneal s/s Neuro/Psych:  Normal mood and affect.     Assessment: pt stable  Plan:  1. Pelvic pain I told her a formal u/s is really needed to see if there is rPOCs. Recommended she complete her augmentin, will write her out until next Friday and f/u with her via telephone next week after her u/s; pt first available next Wednesday.  - US PELVIS TRANSVAGINAL NON-OB (TV ONLY); Future   RTC: PRN  Durene Romans MD Attending Center for  Nurse, mental health [employer])

## 2021-12-08 ENCOUNTER — Encounter: Payer: Self-pay | Admitting: Obstetrics and Gynecology

## 2021-12-08 ENCOUNTER — Other Ambulatory Visit (HOSPITAL_COMMUNITY): Payer: Self-pay

## 2021-12-08 DIAGNOSIS — B379 Candidiasis, unspecified: Secondary | ICD-10-CM

## 2021-12-09 ENCOUNTER — Telehealth: Payer: Self-pay | Admitting: Obstetrics and Gynecology

## 2021-12-09 ENCOUNTER — Ambulatory Visit (HOSPITAL_COMMUNITY)
Admission: RE | Admit: 2021-12-09 | Discharge: 2021-12-09 | Disposition: A | Discharge status: Home / Self Care | Payer: Medicaid Other | Source: Ambulatory Visit | Attending: Obstetrics and Gynecology | Admitting: Obstetrics and Gynecology

## 2021-12-09 DIAGNOSIS — R102 Pelvic and perineal pain: Secondary | ICD-10-CM | POA: Diagnosis present

## 2021-12-09 DIAGNOSIS — O034 Incomplete spontaneous abortion without complication: Secondary | ICD-10-CM | POA: Insufficient documentation

## 2021-12-09 MED ORDER — FLUCONAZOLE 150 MG PO TABS: 150.0000 mg | ORAL_TABLET | Freq: Once | ORAL | 0 refills | Status: AC

## 2021-12-09 MED ORDER — CYCLOBENZAPRINE HCL 10 MG PO TABS: 10.0000 mg | ORAL_TABLET | Freq: Three times a day (TID) | ORAL | 0 refills | Status: DC | PRN

## 2021-12-09 NOTE — Telephone Encounter (Signed)
GYN Telephone Note D/w pt re: u/s findings today. She is still having some pain and spotting. No definite retained products but since she still as discomfort, recommend uterine cavity eval either by sonosite or OR. Pt prefers the OR in case something needs to be removed at the time.   D/w her re: hysteroscopy, d&c which she is amenable to. Request sent to OR, hopefully can do next week.   Flexeril for discomfort sent in in the interim.   Durene Romans MD Attending Center for [employer] (Faculty Practice) 12/09/2021 Time: [phone]

## 2021-12-10 ENCOUNTER — Telehealth: Payer: Self-pay

## 2021-12-10 NOTE — Telephone Encounter (Signed)
Called patient, no answer, left voicemail with surgery date, time, location and preop instructions. 

## 2021-12-11 NOTE — Progress Notes (Signed)
Instructions only were left on VM  ERAS Protcol - Clears until 1300  Anesthesia review: N  Patient verbally denies any shortness of breath, fever, cough and chest pain during phone call   -------------  SDW INSTRUCTIONS given:  Your procedure is scheduled on 12/14/21.  Report to [facility] Entrance "A" at 1:30 P.M., and check in at the Admitting office.  Call this number if you have problems the morning of surgery:  [phone]   Remember:  Do not eat after midnight the night before your surgery  You may drink clear liquids until 1 PM the afternoon of your surgery.   Clear liquids allowed are: Water, Non-Citrus Juices (without pulp), Carbonated Beverages, Clear Tea, Black Coffee Only, and Gatorade    Take these medicines the morning of surgery with A SIP OF WATER  N/A  As of today, STOP taking any Aspirin (unless otherwise instructed by your surgeon) Aleve, Naproxen, Ibuprofen, Motrin, Advil, Goody's, BC's, all herbal medications, fish oil, and all vitamins.                      Do not wear jewelry, make up, or nail polish            Do not wear lotions, powders, perfumes/colognes, or deodorant.            Do not shave 48 hours prior to surgery.  Men may shave face and neck.            Do not bring valuables to the hospital.            [facility] is not responsible for any belongings or valuables.  Do NOT Smoke (Tobacco/Vaping) 24 hours prior to your procedure If you use a CPAP at night, you may bring all equipment for your overnight stay.   Contacts, glasses, dentures or bridgework may not be worn into surgery.      For patients admitted to the hospital, discharge time will be determined by your treatment team.   Patients discharged the day of surgery will not be allowed to drive home, and someone needs to stay with them for 24 hours.    Special instructions:   [place]- Preparing For Surgery  Before surgery, you can play an important role. Because skin is  not sterile, your skin needs to be as free of germs as possible. You can reduce the number of germs on your skin by washing with CHG (chlorahexidine gluconate) Soap before surgery.  CHG is an antiseptic cleaner which kills germs and bonds with the skin to continue killing germs even after washing.    Oral Hygiene is also important to reduce your risk of infection.  Remember - BRUSH YOUR TEETH THE MORNING OF SURGERY WITH YOUR REGULAR TOOTHPASTE  Please do not use if you have an allergy to CHG or antibacterial soaps. If your skin becomes reddened/irritated stop using the CHG.  Do not shave (including legs and underarms) for at least 48 hours prior to first CHG shower. It is OK to shave your face.  Please follow these instructions carefully.   Shower the NIGHT BEFORE SURGERY and the MORNING OF SURGERY with DIAL Soap.   Pat yourself dry with a CLEAN TOWEL.  Wear CLEAN PAJAMAS to bed the night before surgery  Place CLEAN SHEETS on your bed the night of your first shower and DO NOT SLEEP WITH PETS.   Day of Surgery: Please shower morning of surgery  Wear Clean/Comfortable clothing  the morning of surgery Do not apply any deodorants/lotions.   Remember to brush your teeth WITH YOUR REGULAR TOOTHPASTE.   Questions were answered. Patient verbalized understanding of instructions.

## 2021-12-14 ENCOUNTER — Ambulatory Visit (HOSPITAL_BASED_OUTPATIENT_CLINIC_OR_DEPARTMENT_OTHER): Payer: Medicaid Other | Admitting: Certified Registered Nurse Anesthetist

## 2021-12-14 ENCOUNTER — Ambulatory Visit (HOSPITAL_COMMUNITY)
Admission: RE | Admit: 2021-12-14 | Discharge: 2021-12-14 | Disposition: A | Discharge status: Home / Self Care | Payer: Medicaid Other | Attending: Obstetrics and Gynecology | Admitting: Obstetrics and Gynecology

## 2021-12-14 ENCOUNTER — Other Ambulatory Visit: Payer: Self-pay

## 2021-12-14 ENCOUNTER — Encounter (HOSPITAL_COMMUNITY)
Admission: RE | Disposition: A | Discharge status: Home / Self Care | Payer: Self-pay | Source: Home / Self Care | Attending: Obstetrics and Gynecology

## 2021-12-14 ENCOUNTER — Ambulatory Visit (HOSPITAL_COMMUNITY): Payer: Medicaid Other | Admitting: Certified Registered Nurse Anesthetist

## 2021-12-14 ENCOUNTER — Encounter (HOSPITAL_COMMUNITY): Payer: Self-pay | Admitting: Obstetrics and Gynecology

## 2021-12-14 DIAGNOSIS — O034 Incomplete spontaneous abortion without complication: Secondary | ICD-10-CM | POA: Diagnosis not present

## 2021-12-14 DIAGNOSIS — R102 Pelvic and perineal pain: Secondary | ICD-10-CM | POA: Diagnosis not present

## 2021-12-14 DIAGNOSIS — F418 Other specified anxiety disorders: Secondary | ICD-10-CM | POA: Diagnosis not present

## 2021-12-14 DIAGNOSIS — O021 Missed abortion: Secondary | ICD-10-CM

## 2021-12-14 DIAGNOSIS — N939 Abnormal uterine and vaginal bleeding, unspecified: Secondary | ICD-10-CM | POA: Diagnosis present

## 2021-12-14 DIAGNOSIS — Z87891 Personal history of nicotine dependence: Secondary | ICD-10-CM | POA: Insufficient documentation

## 2021-12-14 HISTORY — PX: HYSTEROSCOPY WITH D & C: SHX1775

## 2021-12-14 LAB — TYPE AND SCREEN
ABO/RH(D): A POS
Antibody Screen: NEGATIVE

## 2021-12-14 SURGERY — DILATATION AND CURETTAGE /HYSTEROSCOPY
Anesthesia: General | Site: Uterus

## 2021-12-14 MED ORDER — ONDANSETRON HCL 4 MG/2ML IJ SOLN
INTRAMUSCULAR | Status: AC
  Filled 2021-12-14: qty 2

## 2021-12-14 MED ORDER — LIDOCAINE 2% (20 MG/ML) 5 ML SYRINGE
INTRAMUSCULAR | Status: DC | PRN
  Administered 2021-12-14: 60 mg via INTRAVENOUS

## 2021-12-14 MED ORDER — OXYCODONE HCL 5 MG PO TABS: 5.0000 mg | ORAL_TABLET | Freq: Four times a day (QID) | ORAL | 0 refills | Status: DC | PRN

## 2021-12-14 MED ORDER — OXYCODONE HCL 5 MG/5ML PO SOLN
ORAL | Status: AC
  Filled 2021-12-14: qty 5

## 2021-12-14 MED ORDER — DOXYCYCLINE HYCLATE 100 MG IV SOLR
200.0000 mg | INTRAVENOUS | Status: AC
  Administered 2021-12-14: 200 mg via INTRAVENOUS
  Filled 2021-12-14: qty 200

## 2021-12-14 MED ORDER — KETOROLAC TROMETHAMINE 15 MG/ML IJ SOLN
15.0000 mg | INTRAMUSCULAR | Status: AC
  Administered 2021-12-14: 15 mg via INTRAVENOUS
  Filled 2021-12-14: qty 1

## 2021-12-14 MED ORDER — FENTANYL CITRATE (PF) 250 MCG/5ML IJ SOLN
INTRAMUSCULAR | Status: AC
  Filled 2021-12-14: qty 5

## 2021-12-14 MED ORDER — OXYCODONE HCL 5 MG/5ML PO SOLN
5.0000 mg | ORAL | Status: DC | PRN
  Administered 2021-12-14: 5 mg via ORAL

## 2021-12-14 MED ORDER — AMISULPRIDE (ANTIEMETIC) 5 MG/2ML IV SOLN: 10.0000 mg | Freq: Once | INTRAVENOUS | Status: DC | PRN

## 2021-12-14 MED ORDER — DEXAMETHASONE SODIUM PHOSPHATE 10 MG/ML IJ SOLN
INTRAMUSCULAR | Status: DC | PRN
  Administered 2021-12-14: 10 mg

## 2021-12-14 MED ORDER — MIDAZOLAM HCL 2 MG/2ML IJ SOLN
INTRAMUSCULAR | Status: AC
Start: 2021-12-14 — End: ?
  Filled 2021-12-14: qty 2

## 2021-12-14 MED ORDER — FENTANYL CITRATE (PF) 250 MCG/5ML IJ SOLN
INTRAMUSCULAR | Status: DC | PRN
  Administered 2021-12-14: 50 ug via INTRAVENOUS

## 2021-12-14 MED ORDER — IBUPROFEN 600 MG PO TABS: 600.0000 mg | ORAL_TABLET | Freq: Four times a day (QID) | ORAL | 1 refills | Status: DC | PRN

## 2021-12-14 MED ORDER — MIDAZOLAM HCL 2 MG/2ML IJ SOLN
INTRAMUSCULAR | Status: AC
  Filled 2021-12-14: qty 2

## 2021-12-14 MED ORDER — ONDANSETRON HCL 4 MG/2ML IJ SOLN
INTRAMUSCULAR | Status: DC | PRN
  Administered 2021-12-14: 4 mg via INTRAVENOUS

## 2021-12-14 MED ORDER — CHLORHEXIDINE GLUCONATE 0.12 % MT SOLN: 15.0000 mL | Freq: Once | OROMUCOSAL | Status: DC

## 2021-12-14 MED ORDER — DEXAMETHASONE SODIUM PHOSPHATE 10 MG/ML IJ SOLN
INTRAMUSCULAR | Status: DC | PRN
  Administered 2021-12-14: 10 mg via INTRAVENOUS

## 2021-12-14 MED ORDER — ACETAMINOPHEN 500 MG PO TABS
1000.0000 mg | ORAL_TABLET | ORAL | Status: AC
  Administered 2021-12-14: 1000 mg via ORAL
  Filled 2021-12-14: qty 2

## 2021-12-14 MED ORDER — ORAL CARE MOUTH RINSE
15.0000 mL | Freq: Once | OROMUCOSAL | Status: AC
  Administered 2021-12-14: 15 mL via OROMUCOSAL

## 2021-12-14 MED ORDER — POVIDONE-IODINE 10 % EX SWAB: 2.0000 | Freq: Once | CUTANEOUS | Status: DC

## 2021-12-14 MED ORDER — FENTANYL CITRATE (PF) 100 MCG/2ML IJ SOLN
INTRAMUSCULAR | Status: AC
  Filled 2021-12-14: qty 2

## 2021-12-14 MED ORDER — LACTATED RINGERS IV SOLN: INTRAVENOUS | Status: DC

## 2021-12-14 MED ORDER — DEXAMETHASONE SODIUM PHOSPHATE 10 MG/ML IJ SOLN
INTRAMUSCULAR | Status: AC
  Filled 2021-12-14: qty 1

## 2021-12-14 MED ORDER — CHLORHEXIDINE GLUCONATE 0.12 % MT SOLN
OROMUCOSAL | Status: AC
  Filled 2021-12-14: qty 15

## 2021-12-14 MED ORDER — PROPOFOL 10 MG/ML IV BOLUS
INTRAVENOUS | Status: DC | PRN
  Administered 2021-12-14: 200 mg via INTRAVENOUS
  Administered 2021-12-14: 100 mg via INTRAVENOUS

## 2021-12-14 MED ORDER — MIDAZOLAM HCL 2 MG/2ML IJ SOLN
INTRAMUSCULAR | Status: DC | PRN
  Administered 2021-12-14: 2 mg via INTRAVENOUS

## 2021-12-14 MED ORDER — PROPOFOL 10 MG/ML IV BOLUS
INTRAVENOUS | Status: AC
  Filled 2021-12-14: qty 20

## 2021-12-14 MED ORDER — FENTANYL CITRATE (PF) 100 MCG/2ML IJ SOLN
25.0000 ug | INTRAMUSCULAR | Status: DC | PRN
  Administered 2021-12-14 (×4): 25 ug via INTRAVENOUS

## 2021-12-14 SURGICAL SUPPLY — 20 items
CATH ROBINSON RED A/P 16FR (CATHETERS) ×1 IMPLANT
FILTER UTR ASPR ASSEMBLY (MISCELLANEOUS) ×1 IMPLANT
GAUZE 4X4 16PLY ~~LOC~~+RFID DBL (SPONGE) ×1 IMPLANT
GLOVE BIOGEL PI IND STRL 7.0 (GLOVE) ×2 IMPLANT
GLOVE BIOGEL PI INDICATOR 7.0 (GLOVE) ×2
GLOVE ECLIPSE 6.5 STRL STRAW (GLOVE) ×2 IMPLANT
GLOVE SURG ENC MOIS LTX SZ6 (GLOVE) ×1 IMPLANT
GOWN STRL REUS W/ TWL LRG LVL3 (GOWN DISPOSABLE) ×2 IMPLANT
GOWN STRL REUS W/TWL LRG LVL3 (GOWN DISPOSABLE) ×2
HOSE CONNECTING 18IN BERKELEY (TUBING) ×1 IMPLANT
KIT BERKELEY 1ST TRI 3/8 NO TR (MISCELLANEOUS) ×1 IMPLANT
KIT BERKELEY 1ST TRIMESTER 3/8 (MISCELLANEOUS) ×1 IMPLANT
KIT PROCEDURE FLUENT (KITS) IMPLANT
NS IRRIG 1000ML POUR BTL (IV SOLUTION) ×1 IMPLANT
PACK VAGINAL MINOR WOMEN LF (CUSTOM PROCEDURE TRAY) ×1 IMPLANT
PAD OB MATERNITY 4.3X12.25 (PERSONAL CARE ITEMS) ×1 IMPLANT
SET BERKELEY SUCTION TUBING (SUCTIONS) ×1 IMPLANT
SPIKE FLUID TRANSFER (MISCELLANEOUS) ×1 IMPLANT
TOWEL GREEN STERILE FF (TOWEL DISPOSABLE) ×1 IMPLANT
UNDERPAD 30X36 HEAVY ABSORB (UNDERPADS AND DIAPERS) ×1 IMPLANT

## 2021-12-14 NOTE — H&P (Signed)
Faculty Practice Obstetrics and Gynecology Attending History and Physical  Felicia Davila is a 31 y.o. [phone] who presented to the main OR today for evaluation of possible retained POC.   She had a 7/14 suction d&c by Dr. Elly Modena for retained POCs after incomplete SAB after cytotec; d&c uncomplicated and she was discharged home from the pacu and negative pathology that showed villi. She went to the [facility] on 7/18 for pain and some discharge and bleeding and ? Minimal vascularity on u/s and a new 3cm cyst on the left ovary. Patient deemed stable for discharge and she says she did well until about a week ago when she started having fevers at home and lower belly pain. No vb but just some yellowish discharge. She went to the [facility] yesterday and had negative cbc, wet prep and gc/ct and u/a just showed blood.    Dr. Ilda Basset saw her on 8/4 and a bedside u/s didn't show overt rPOCs and looked better vs the one she had in [facility] but she was still having abdominal pain. So ucx ordered and pt put on augmentin for ?endometritis and prn toradol.  Ucx neg.   She then had an [place] on 8/16 which showed EL 6m but heterogenous material within the canal, predominantly hypoechoic which likely represents complex fluid or blood. There was no abnormal vascularity with the canal.   However, given her symptoms, Dr. PIlda Bassetrecommended D&C, hysteroscopy for definitive diagnosis.   Her support person today with her is BAaron Edelman She is comfortable with him being notified of findings after the surgery.   Past Medical History:  Diagnosis Date   Anxiety    Depression    Lupus (HCC)    PONV (postoperative nausea and vomiting)    Past Surgical History:  Procedure Laterality Date   CESAREAN SECTION     CHOLECYSTECTOMY     DILATION AND EVACUATION N/A 11/06/2021   Procedure: DILATATION AND EVACUATION;  Surgeon: CMora Bellman MD;  [facility]  Service: Gynecology;  Laterality: N/A;   FOREARM SURGERY Left    OB History   Gravida Para Term Preterm AB Living  '4 2 1 1 1 2  '$ SAB IAB Ectopic Multiple Live Births      1   2    # Outcome Date GA Lbr Len/2nd Weight Sex Delivery Anes PTL Lv  4 Gravida           3 Preterm 10/04/14     CS-Unspec   LIV  2 Term 04/05/13     CS-Unspec   LIV  1 Ectopic           Patient denies any other pertinent gynecologic issues.  No current facility-administered medications on file prior to encounter.   Current Outpatient Medications on File Prior to Encounter  Medication Sig Dispense Refill   ibuprofen (ADVIL) 200 MG tablet Take 600 mg by mouth every 8 (eight) hours as needed (pain.).     cyclobenzaprine (FLEXERIL) 10 MG tablet Take 1 tablet (10 mg total) by mouth every 8 (eight) hours as needed for muscle spasms. 20 tablet 0   ketorolac (TORADOL) 10 MG tablet Take 1 tablet (10 mg total) by mouth every 8 (eight) hours as needed. (Patient not taking: Reported on 12/10/2021) 20 tablet 0   Allergies  Allergen Reactions   Morphine And Related Anaphylaxis, Rash and Other (See Comments)    Per extensive conversation with patient, surgical attending and surgical PA, patient does NOT have an anaphylactic reaction to morphine.  Has taken for short periods in the past without issues. Pins and needle feeling in extremities. (??)     Nitrofurantoin Nausea And Vomiting and Other (See Comments)    Also, "felt "weird in the head"    Fentanyl Other (See Comments)    "Makes Pt hot and tingly"   Hydrocodone Other (See Comments)    Pins and needle feeling in extremities   Chlorhexidine Itching and Rash    Social History:   reports that she has quit smoking. She has never used smokeless tobacco. She reports that she does not drink alcohol and does not use drugs. Family History  Problem Relation Age of Onset   Healthy Mother    Lupus Father     Review of Systems: Pertinent items noted in HPI and remainder of comprehensive ROS otherwise negative.  PHYSICAL EXAM: Blood pressure 114/78,  pulse 71, temperature 98.4 F (36.9 C), temperature source Oral, resp. rate 20, height '5\' 7"'$  (1.702 m), weight 71.7 kg, last menstrual period 09/07/2021, SpO2 98 %. CONSTITUTIONAL: Well-developed, well-nourished female in no acute distress.  HENT:  Normocephalic, atraumatic, External right and left ear normal. Oropharynx is clear and moist EYES: Conjunctivae and EOM are normal. Pupils are equal, round, and reactive to light. No scleral icterus.  NECK: Normal range of motion, supple, no masses SKIN: Skin is warm and dry. No rash noted. Not diaphoretic. No erythema. No pallor. NEUROLOGIC: Alert and oriented to person, place, and time. Normal reflexes, muscle tone coordination. No cranial nerve deficit noted. PSYCHIATRIC: Normal mood and affect. Normal behavior. Normal judgment and thought content. CARDIOVASCULAR: Normal heart rate noted, regular rhythm RESPIRATORY: Effort and breath sounds normal, no problems with respiration noted ABDOMEN: Soft, nontender, nondistended. PELVIC: Deferred MUSCULOSKELETAL: Normal range of motion. No tenderness.  No cyanosis, clubbing, or edema.  2+ distal pulses.  Labs: Results for orders placed or performed during the hospital encounter of 12/14/21 (from the past 336 hour(s))  Type and screen [place]   Collection Time: 12/14/21  2:57 PM  Result Value Ref Range   ABO/RH(D) PENDING    Antibody Screen PENDING    Sample Expiration      12/17/2021,2359 Performed at [facility], [place] [address] [place], [place] [address]     Imaging Studies: US PELVIC COMPLETE WITH TRANSVAGINAL  Result Date: 12/09/2021 CLINICAL DATA:  Pelvic pain, retained products of conception, post D&E 11/06/2021 EXAM: TRANSABDOMINAL AND TRANSVAGINAL ULTRASOUND OF PELVIS TECHNIQUE: Both transabdominal and transvaginal ultrasound examinations of the pelvis were performed. Transabdominal technique was performed for global imaging of the pelvis including uterus,  ovaries, adnexal regions, and pelvic cul-de-sac. It was necessary to proceed with endovaginal exam following the transabdominal exam to visualize the endometrium. COMPARISON:  11/10/2021 FINDINGS: Uterus Measurements: 8.1 x 3.7 x 4.9 cm = volume: 75 mL. Anteverted. No focal uterine mass. Endometrium Thickness: 2 mm. Complex heterogeneous material within the endometrial canal predominantly hypoechoic, with only minimal hyperechogenicity seen. This appears to represent predominantly complex fluid or blood. The multiple areas of heterogeneous increased echogenicity on the previous exam are no longer identified. No abnormal vascularity within the endometrial canal or the minimal residual hyperechoic region. Right ovary Measurements: 2.9 x 1.7 x 2.5 cm = volume: 6.4 mL. Normal morphology without mass Left ovary Measurements: 3.0 x 1.2 x 1.7 cm = volume: 3.3 mL. Normal morphology without mass Other findings Trace nonspecific free pelvic fluid.  No adnexal masses. IMPRESSION: Complex heterogeneous material within the endometrial canal, predominantly hypoechoic,  appears to represent predominantly complex fluid or blood. Previously identified large areas of heterogeneous increased echogenicity within the endometrial canal are no longer identified. Only minimal residual hyperechogenicity and no hypervascularity within endometrial canal, without definite retained products of conception. Remainder of exam unremarkable. Electronically Signed   By: Lavonia Dana M.D.   On: 12/09/2021 14:00    Assessment: Active Problems:   Abnormal uterine bleeding (AUB)   Plan: - Reviewed the consent with the patient and the planned procedure. Reviewed low risk and similar recovery to D&E that she had in July.  - Reviewed surgery will be both diagnostic and therapeutic.  - All questions answered. Consent signed.    Radene Gunning, MD, [place] for [facility], [place]

## 2021-12-14 NOTE — Op Note (Signed)
PREOPERATIVE DIAGNOSIS:  pelvic pain, abnormal appearing ultrasound POSTOPERATIVE DIAGNOSIS: The same PROCEDURE: Hysteroscopy, Dilation and Curettage. SURGEON:  Dr. Lynnda Shields   INDICATIONS: 31 y.o. [id number]  here for scheduled surgery for the aforementioned diagnoses.   Risks of surgery were discussed with the patient including but not limited to: bleeding which may require transfusion; infection which may require antibiotics; injury to uterus or surrounding organs; intrauterine scarring which may impair future fertility; need for additional procedures including laparotomy or laparoscopy; and other postoperative/anesthesia complications. Written informed consent was obtained.    FINDINGS:  A 8 week size uterus.  Shaggy and ratty appearing endometrium in the uterine fundus and posterior margin.  Endometrium did not appear overly vascular of infected.  Normal ostia bilaterally.  ANESTHESIA:   General INTRAVENOUS FLUIDS:  500 ml of LR FLUID DEFICITS:  150 ml of NS ESTIMATED BLOOD LOSS:  Less than 5 ml SPECIMENS: Endometrial curettings sent to pathology COMPLICATIONS:  None immediate.  PROCEDURE DETAILS:  The patient was then taken to the operating room where general anesthesia was administered and was found to be adequate.  After an adequate timeout was performed, she was placed in the dorsal lithotomy position and examined; then prepped and draped in the sterile manner.  A speculum was then placed in the patient's vagina and a single tooth tenaculum was applied to the anterior lip of the cervix.     The uterus was sounded to 7 cm and the cervix was dilated manually with metal dilators to accommodate the 5 mm diagnostic hysteroscope.  The hysteroscope was then inserted under direct visualization using NS as a suspension medium.  The uterine cavity was carefully examined with the findings as noted above.   After further careful visualization of the uterine cavity, the hysteroscope was removed under  direct visualization.  A sharp curettage was then performed to obtain a moderate amount of endometrial curettings. A look back was performed with the hysteroscope and the previous findings were now absent. The tenaculum was removed from the anterior lip of the cervix and the vaginal speculum was removed after noting good hemostasis.  The patient tolerated the procedure well and was taken to the recovery area awake, extubated and in stable condition.  The patient will be discharged to home as per PACU criteria.  Routine postoperative instructions given.  She was prescribed  Ibuprofen and Colace.  She will follow up in the clinic in 2-3 weeks  for postoperative evaluation.   Lynnda Shields, MD, [place] for [facility], [place]

## 2021-12-14 NOTE — Progress Notes (Signed)
Due to lateness of the the evening, Dr. Damita Dunnings was unable to perform the procedure.  I have reviewed the H and P in detail along with her current ultrasounds as well as her labs.  Addendum added to consent with my signature.  Spoke with patient and reviewed previous findings.  Will begin with diagnostic hysteroscopy and only proceed with D and C  if there are any obvious visual findings since endometrial stripe was relatively thin.   Lynnda Shields, MD

## 2021-12-14 NOTE — Transfer of Care (Signed)
Immediate Anesthesia Transfer of Care Note  Patient: Felicia Davila  Procedure(s) Performed: DILATATION AND CURETTAGE /HYSTEROSCOPY (Uterus)  Patient Location: PACU  Anesthesia Type:General  Level of Consciousness: drowsy  Airway & Oxygen Therapy: Patient Spontanous Breathing  Post-op Assessment: Report given to RN and Post -op Vital signs reviewed and stable  Post vital signs: Reviewed and stable  Last Vitals:  Vitals Value Taken Time  BP 109/74 12/14/21 1917  Temp    Pulse 60 12/14/21 1920  Resp 11 12/14/21 1920  SpO2 98 % 12/14/21 1920  Vitals shown include unvalidated device data.  Last Pain:  Vitals:   12/14/21 1438  TempSrc:   PainSc: 7          Complications: No notable events documented.

## 2021-12-14 NOTE — Anesthesia Preprocedure Evaluation (Signed)
Anesthesia Evaluation  Patient identified by MRN, date of birth, ID band Patient awake    Reviewed: Allergy & Precautions, NPO status , Patient's Chart, lab work & pertinent test results  History of Anesthesia Complications (+) PONV and history of anesthetic complications  Airway Mallampati: I  TM Distance: >3 FB Neck ROM: Full    Dental no notable dental hx.    Pulmonary neg pulmonary ROS, Patient abstained from smoking., former smoker,    Pulmonary exam normal        Cardiovascular negative cardio ROS   Rhythm:Regular Rate:Normal     Neuro/Psych Anxiety Depression    GI/Hepatic negative GI ROS, Neg liver ROS,   Endo/Other  negative endocrine ROS  Renal/GU negative Renal ROS  Female GU complaint Retained POC    Musculoskeletal negative musculoskeletal ROS (+)   Abdominal Normal abdominal exam  (+)   Peds  Hematology negative hematology ROS (+)   Anesthesia Other Findings   Reproductive/Obstetrics                             Anesthesia Physical Anesthesia Plan  ASA: 2  Anesthesia Plan: General   Post-op Pain Management:    Induction: Intravenous  PONV Risk Score and Plan: 4 or greater and Ondansetron, Dexamethasone, Midazolam and Treatment may vary due to age or medical condition  Airway Management Planned: Mask and LMA  Additional Equipment: None  Intra-op Plan:   Post-operative Plan: Extubation in OR  Informed Consent: I have reviewed the patients History and Physical, chart, labs and discussed the procedure including the risks, benefits and alternatives for the proposed anesthesia with the patient or authorized representative who has indicated his/her understanding and acceptance.     Dental advisory given  Plan Discussed with: CRNA  Anesthesia Plan Comments: (Lab Results      Component                Value               Date                      WBC                       5.1                 11/26/2021                HGB                      11.8 (L)            11/26/2021                HCT                      35.7 (L)            11/26/2021                MCV                      91.5                11/26/2021                PLT  229                 11/26/2021           Lab Results      Component                Value               Date                      NA                       139                 10/23/2021                K                        4.2                 10/23/2021                CO2                      25                  10/23/2021                GLUCOSE                  105 (H)             10/23/2021                BUN                      6                   10/23/2021                CREATININE               0.63                10/23/2021                CALCIUM                  8.5 (L)             10/23/2021                GFRNONAA                 >60                 10/23/2021          )        Anesthesia Quick Evaluation

## 2021-12-14 NOTE — Progress Notes (Deleted)
Due to lateness of evening Dr. Damita Dunnings was unable to perform the procedure.  I have reviewed the H and P in detail along with her current ultrasounds as well as her labs.  Addendum added to consent with my signature.  Spoke with patient and reviewed previous findings.  Will begin with diagnostic hysteroscopy and only proceed with D and C if there are any obvious visual findings since endometrial stripe was relatively thin.    Lynnda Shields, MD

## 2021-12-14 NOTE — Anesthesia Procedure Notes (Signed)
Procedure Name: Intubation Date/Time: 12/14/2021 6:36 PM  Performed by: Clovis Cao, CRNAPre-anesthesia Checklist: Patient identified, Emergency Drugs available, Suction available and Patient being monitored Patient Re-evaluated:Patient Re-evaluated prior to induction Oxygen Delivery Method: Circle system utilized Preoxygenation: Pre-oxygenation with 100% oxygen Induction Type: IV induction Ventilation: Mask ventilation without difficulty LMA: LMA inserted LMA Size: 4.0 Number of attempts: 1 Placement Confirmation: positive ETCO2 and breath sounds checked- equal and bilateral Tube secured with: Tape Dental Injury: Teeth and Oropharynx as per pre-operative assessment

## 2021-12-15 ENCOUNTER — Other Ambulatory Visit: Payer: Self-pay

## 2021-12-15 ENCOUNTER — Encounter (HOSPITAL_COMMUNITY): Payer: Self-pay | Admitting: Obstetrics and Gynecology

## 2021-12-16 LAB — SURGICAL PATHOLOGY

## 2021-12-16 NOTE — Progress Notes (Signed)
Agree with the pathology finding of retained products of conception  Radene Gunning, MD Attending [place], [facility] for [facility], [place]

## 2021-12-16 NOTE — Anesthesia Postprocedure Evaluation (Signed)
Anesthesia Post Note  Patient: Felicia Davila  Procedure(s) Performed: DILATATION AND CURETTAGE /HYSTEROSCOPY (Uterus)     Patient location during evaluation: PACU Anesthesia Type: General Level of consciousness: awake and alert Pain management: pain level controlled Vital Signs Assessment: post-procedure vital signs reviewed and stable Respiratory status: spontaneous breathing, nonlabored ventilation, respiratory function stable and patient connected to nasal cannula oxygen Cardiovascular status: blood pressure returned to baseline and stable Postop Assessment: no apparent nausea or vomiting Anesthetic complications: no   No notable events documented.  Last Vitals:  Vitals:   12/14/21 2000 12/14/21 2015  BP: 101/71 99/69  Pulse: (!) 59 (!) 58  Resp: 10 18  Temp:  36.4 C  SpO2: 100% 100%    Last Pain:  Vitals:   12/14/21 2015  TempSrc:   PainSc: 4    Pain Goal: Patients Stated Pain Goal: 3 (12/14/21 2015)                 [place]

## 2022-01-05 ENCOUNTER — Encounter: Payer: Self-pay | Admitting: Obstetrics and Gynecology

## 2022-01-11 ENCOUNTER — Other Ambulatory Visit (HOSPITAL_COMMUNITY)
Admission: RE | Admit: 2022-01-11 | Discharge: 2022-01-11 | Disposition: A | Discharge status: Home / Self Care | Payer: Medicaid Other | Source: Ambulatory Visit | Attending: Obstetrics and Gynecology | Admitting: Obstetrics and Gynecology

## 2022-01-11 ENCOUNTER — Encounter: Payer: Self-pay | Admitting: Obstetrics and Gynecology

## 2022-01-11 ENCOUNTER — Ambulatory Visit (INDEPENDENT_AMBULATORY_CARE_PROVIDER_SITE_OTHER): Payer: Medicaid Other | Admitting: Obstetrics and Gynecology

## 2022-01-11 ENCOUNTER — Other Ambulatory Visit: Payer: Self-pay

## 2022-01-11 VITALS — BP 122/81 | HR 77 | Ht 66.0 in | Wt 155.4 lb

## 2022-01-11 DIAGNOSIS — Z01419 Encounter for gynecological examination (general) (routine) without abnormal findings: Secondary | ICD-10-CM | POA: Diagnosis not present

## 2022-01-11 DIAGNOSIS — N898 Other specified noninflammatory disorders of vagina: Secondary | ICD-10-CM

## 2022-01-11 DIAGNOSIS — R339 Retention of urine, unspecified: Secondary | ICD-10-CM

## 2022-01-11 DIAGNOSIS — Z4889 Encounter for other specified surgical aftercare: Secondary | ICD-10-CM

## 2022-01-11 DIAGNOSIS — Z30011 Encounter for initial prescription of contraceptive pills: Secondary | ICD-10-CM

## 2022-01-11 MED ORDER — NORETHINDRONE ACET-ETHINYL EST 1-20 MG-MCG PO TABS: 1.0000 | ORAL_TABLET | Freq: Every day | ORAL | 3 refills | Status: DC

## 2022-01-11 NOTE — Progress Notes (Signed)
    Subjective:    Felicia Davila is a 31 y.o. female who presents to the clinic status post hysteroscopy dilation and curettage on 12/14/21. The patient is not having any pain.  Eating a regular diet without difficulty. Bowel movements are normal. No other significant postoperative concerns.  The following portions of the patient's history were reviewed and updated as appropriate: allergies, current medications, past family history, past medical history, past social history, past surgical history, and problem list..  Last pap smear was normal in 2021.  The patient has noticed new bladder symptoms since the first D and C with nocturia x 4-5 /night and a sensation of bladder pressure and incomplete emptying.She denies and symptoms of stress incontinence and has minimal symptoms of urge incontinence while she is awake.  Urine culture is pending.  Review of Systems Pertinent items are noted in HPI.   Objective:   BP 122/81   Pulse 77   Ht '5\' 6"'$  (1.676 m)   Wt 155 lb 6.4 oz (70.5 kg)   LMP 09/07/2021 Comment: sab  BMI 25.08 kg/m  Constitutional:  Well-developed, well-nourished female in no acute distress.   Skin: Skin is warm and dry, no rash noted, not diaphoretic,no erythema, no pallor.  Cardiovascular: Normal heart rate noted  Respiratory: Effort and breath sounds normal, no problems with respiration noted  Abdomen: Soft, bowel sounds active, non-tender, no abnormal masses  Incision: Healing well, no drainage, no erythema, no hernia, no seroma, no swelling, no dehiscence, incision well approximated  Pelvic:    Normal external genitalia, bladder well supported small amount of scant blood from os, no abnl discharge   Surgical pathology () C/w retained products of conception Assessment:   Doing well postoperatively.  Operative findings again reviewed. Pathology report discussed.   Plan:   1. Continue any current medications. 2. Wound care discussed. 3. Activity restrictions:   increased bath room breaks at work 4. Anticipated return to work: now. 5. Follow up as needed 6.  Routine preventative health maintenance measures emphasized. 7.  Would like to have extended interval cycles, rx for loestrin 1/20 given with extended regimen dosing. 8.  Will refer to urogynecology for further evaluation and treatment. Consider follow up in 3-4 months. Please refer to After Visit Summary for other counseling recommendations.   Lynnda Shields, MD, [place] Attending [place] for [facility], [place]

## 2022-01-11 NOTE — Progress Notes (Signed)
Pt reports that she is still having spotting after procedure also states is having Urinary issues such as not completely emptying Bladder when she uses the bathroom. Also stating constant discharge but no pain nor odor.

## 2022-01-12 LAB — CERVICOVAGINAL ANCILLARY ONLY
Bacterial Vaginitis (gardnerella): NEGATIVE
Candida Glabrata: NEGATIVE
Candida Vaginitis: NEGATIVE
Comment: NEGATIVE
Comment: NEGATIVE
Comment: NEGATIVE
Comment: NEGATIVE
Trichomonas: NEGATIVE

## 2022-01-13 LAB — CYTOLOGY - PAP
Chlamydia: NEGATIVE
Comment: NEGATIVE
Comment: NEGATIVE
Comment: NORMAL
Diagnosis: NEGATIVE
High risk HPV: NEGATIVE
Neisseria Gonorrhea: NEGATIVE

## 2022-01-13 LAB — URINE CULTURE

## 2022-01-19 ENCOUNTER — Encounter: Payer: Self-pay | Admitting: Obstetrics and Gynecology

## 2022-01-19 DIAGNOSIS — Z30011 Encounter for initial prescription of contraceptive pills: Secondary | ICD-10-CM

## 2022-01-27 MED ORDER — SLYND 4 MG PO TABS: 1.0000 | ORAL_TABLET | Freq: Every day | ORAL | 3 refills | Status: DC

## 2022-02-01 MED ORDER — LEVONORGESTREL 1.5 MG PO TABS: 1.5000 mg | ORAL_TABLET | Freq: Once | ORAL | 0 refills | Status: AC

## 2022-02-24 ENCOUNTER — Ambulatory Visit (INDEPENDENT_AMBULATORY_CARE_PROVIDER_SITE_OTHER): Payer: Medicaid Other | Admitting: Obstetrics and Gynecology

## 2022-02-24 ENCOUNTER — Encounter: Payer: Self-pay | Admitting: Obstetrics and Gynecology

## 2022-02-24 VITALS — BP 122/85 | HR 90 | Ht 65.75 in | Wt 161.0 lb

## 2022-02-24 DIAGNOSIS — N3281 Overactive bladder: Secondary | ICD-10-CM | POA: Diagnosis not present

## 2022-02-24 DIAGNOSIS — R350 Frequency of micturition: Secondary | ICD-10-CM | POA: Diagnosis not present

## 2022-02-24 LAB — POCT URINALYSIS DIPSTICK
Bilirubin, UA: NEGATIVE
Blood, UA: NEGATIVE
Glucose, UA: NEGATIVE
Ketones, UA: NEGATIVE
Leukocytes, UA: NEGATIVE
Nitrite, UA: NEGATIVE
Protein, UA: POSITIVE — AB
Spec Grav, UA: 1.02 (ref 1.010–1.025)
Urobilinogen, UA: 0.2 E.U./dL
pH, UA: 7 (ref 5.0–8.0)

## 2022-02-24 MED ORDER — SOLIFENACIN SUCCINATE 5 MG PO TABS: 5.0000 mg | ORAL_TABLET | Freq: Every day | ORAL | 5 refills | Status: DC

## 2022-02-24 NOTE — Progress Notes (Signed)
[facility] New Patient Evaluation and Consultation  Referring Provider: Griffin Basil, MD PCP: Griffin Basil, MD Date of Service: 02/24/2022  SUBJECTIVE Chief Complaint: New Patient (Initial Visit) Felicia Davila is a 31 y.o. female here for a consult for urinary frequency./)  History of Present Illness: Felicia Davila is a 31 y.o. White or Caucasian female seen in consultation at the request of Dr. Elgie Congo for evaluation of urinary urgency.    Review of records significant for: S/p D&C hysteroscopy for retained POC after SAB with Dr Elgie Congo on 12/14/21.   Has symptoms of urinary frequency and waking 4-5 times a night to urinate.   Urinary Symptoms: Does not leak urine.   Day time voids 10+.  Nocturia: 5 times per night to void.  Urgency started when she was pregnant with her ectopic and continued with most recent miscarriage.  Voiding dysfunction: she does not empty her bladder well.  does not use a catheter to empty bladder.  When urinating, she feels the need to urinate multiple times in a row Drinks: 6-8 cups water per day, tries to avoid other drinks because they irritate her bladder.   UTIs:  0  UTI's in the last year.   Reports history of kidney or bladder stones  Pelvic Organ Prolapse Symptoms:                  She Denies a feeling of a bulge the vaginal area.  Bowel Symptom: Bowel movements: 1 time(s) per day Stool consistency: soft  Straining: no.  Splinting: no.  Incomplete evacuation: no.  She Denies accidental bowel leakage / fecal incontinence Bowel regimen: none   Sexual Function Sexually active: yes.  Pain with sex: Yes, deep in the pelvis  Pelvic Pain Denies pelvic pain  Past Medical History:  Past Medical History:  Diagnosis Date   Anxiety    Depression    Lupus (HCC)    PONV (postoperative nausea and vomiting)      Past Surgical History:   Past Surgical History:  Procedure Laterality Date   CESAREAN SECTION      CHOLECYSTECTOMY     DILATION AND EVACUATION N/A 11/06/2021   Procedure: DILATATION AND EVACUATION;  Surgeon: Mora Bellman, MD;  Location: [facility];  Service: Gynecology;  Laterality: N/A;   FOREARM SURGERY Left    HYSTEROSCOPY WITH D & C  12/14/2021   Procedure: DILATATION AND CURETTAGE /HYSTEROSCOPY;  Surgeon: Griffin Basil, MD;  Location: [facility];  Service: Gynecology;;     Past OB/GYN History: OB History  Gravida Para Term Preterm AB Living  '4 2 1 1 2 2  '$ SAB IAB Ectopic Multiple Live Births  '1   1   2    '$ # Outcome Date GA Lbr Len/2nd Weight Sex Delivery Anes PTL Lv  4 Preterm 10/04/14     CS-Unspec   LIV  3 Term 04/05/13     CS-Unspec   LIV  2 SAB           1 Ectopic           '  Patient's last menstrual period was 09/07/2021. Contraception: Slynd. Last pap smear was 12/2021- negative.   Not sure about more children in the future.    Medications: She has a current medication list which includes the following prescription(s): amoxicillin-clavulanate, slynd, and solifenacin.   Allergies: Patient is allergic to morphine and related, nitrofurantoin, fentanyl, hydrocodone, and chlorhexidine.   Social History:  Social History  Tobacco Use   Smoking status: Former   Smokeless tobacco: Never  Vaping Use   Vaping Use: Every day   Substances: Nicotine  Substance Use Topics   Alcohol use: No   Drug use: No    Relationship status: married She lives with spouse.   She is employed. Regular exercise: Yes:   History of abuse: No  Family History:   Family History  Problem Relation Age of Onset   Healthy Mother    Lupus Father      Review of Systems: Review of Systems  Constitutional:  Negative for fever, malaise/fatigue and weight loss.  Respiratory:  Negative for cough, shortness of breath and wheezing.   Cardiovascular:  Negative for chest pain, palpitations and leg swelling.  Gastrointestinal:  Negative for abdominal pain and blood in stool.  Genitourinary:   Negative for dysuria.  Musculoskeletal:  Negative for myalgias.  Skin:  Negative for rash.  Neurological:  Negative for dizziness and headaches.  Endo/Heme/Allergies:  Does not bruise/bleed easily.  Psychiatric/Behavioral:  Negative for depression. The patient is not nervous/anxious.      OBJECTIVE Physical Exam: Vitals:   02/24/22 1536  BP: 122/85  Pulse: 90  Weight: 161 lb (73 kg)  Height: 5' 5.75" (1.67 m)    Physical Exam Constitutional:      General: She is not in acute distress. Pulmonary:     Effort: Pulmonary effort is normal.  Abdominal:     General: There is no distension.     Palpations: Abdomen is soft.     Tenderness: There is no abdominal tenderness. There is no rebound.  Musculoskeletal:        General: No swelling. Normal range of motion.  Skin:    General: Skin is warm and dry.     Findings: No rash.  Neurological:     Mental Status: She is alert and oriented to person, place, and time.  Psychiatric:        Mood and Affect: Mood normal.        Behavior: Behavior normal.      GU / Detailed Urogynecologic Evaluation:  Pelvic Exam: Normal external female genitalia; Bartholin's and Skene's glands normal in appearance; urethral meatus normal in appearance, no urethral masses or discharge.   CST: negative  Speculum exam reveals normal vaginal mucosa without atrophy. Cervix normal appearance. Uterus normal single, nontender. Adnexa no mass, fullness, tenderness.     Pelvic floor strength I/V  Pelvic floor musculature: Right levator tender, Right obturator non-tender, Left levator non-tender, Left obturator non-tender  POP-Q:   POP-Q  -2.5                                            Aa   -2.5                                           Ba  -7.5                                              C   3  Gh  4                                            Pb  10                                            tvl    -2.5                                            Ap  -2.5                                            Bp  -9.5                                              D      Rectal Exam:  Normal external rectum  Post-Void Residual (PVR) by Bladder Scan: In order to evaluate bladder emptying, we discussed obtaining a postvoid residual and she agreed to this procedure.  Procedure: The ultrasound unit was placed on the patient's abdomen in the suprapubic region after the patient had voided. A PVR of 0 ml was obtained by bladder scan.  Laboratory Results: POC urine: negative   ASSESSMENT AND PLAN Ms. Younker is a 31 y.o. with:  1. Urinary frequency   2. Overactive bladder    - We discussed the symptoms of overactive bladder (OAB), which include urinary urgency, urinary frequency, nocturia, with or without urge incontinence.  While we do not know the exact etiology of OAB, several treatment options exist. We discussed management including behavioral therapy (decreasing bladder irritants, urge suppression strategies, timed voids, bladder retraining), physical therapy, medication; for refractory cases posterior tibial nerve stimulation, sacral neuromodulation, and intravesical botulinum toxin injection.  - Prescribed vesicare '5mg'$  daily. For anticholinergic medications, we discussed the potential side effects of anticholinergics including dry eyes, dry mouth, constipation, cognitive impairment and urinary retention. - Referral placed to pelvic PT  Follow up 6 weeks   Felicia Folds, MD   Medical Decision Making:  - Reviewed/ ordered a clinical laboratory test - Review and summation of prior records

## 2022-02-25 NOTE — Progress Notes (Signed)
Patient was assessed and managed by nursing staff during this encounter. I have reviewed the chart and agree with the documentation and plan. I have also made any necessary editorial changes.  Griffin Basil, MD 02/25/2022 8:34 AM

## 2022-03-02 ENCOUNTER — Ambulatory Visit
Admission: EM | Admit: 2022-03-02 | Discharge: 2022-03-02 | Disposition: A | Discharge status: Home / Self Care | Payer: Medicaid Other | Attending: Physician Assistant | Admitting: Physician Assistant

## 2022-03-02 DIAGNOSIS — H6991 Unspecified Eustachian tube disorder, right ear: Secondary | ICD-10-CM | POA: Diagnosis not present

## 2022-03-02 MED ORDER — PREDNISONE 20 MG PO TABS: 40.0000 mg | ORAL_TABLET | Freq: Every day | ORAL | 0 refills | Status: AC

## 2022-03-02 NOTE — ED Triage Notes (Signed)
Pt presents with ongoing right ear pain, dizziness and headache following completing antibiotics for an ear infection over a week ago.

## 2022-03-02 NOTE — ED Provider Notes (Signed)
[facility]    CSN: [id number] Arrival date & time: 03/02/22  9323      History   Chief Complaint Chief Complaint  Patient presents with   Otalgia   Dizziness   Headache    HPI Felicia Davila is a 31 y.o. female.   Patient here today for evaluation of continued right ear pain, dizziness and headache.  She reports that she completed antibiotic course for otitis media 3 days ago.  She states that she did have improvement of symptoms while taking antibiotic and fever resolved.    The history is provided by the patient.  Otalgia Associated symptoms: headaches   Associated symptoms: no congestion, no fever and no vomiting   Dizziness Associated symptoms: headaches   Associated symptoms: no nausea, no shortness of breath and no vomiting   Headache Associated symptoms: dizziness and ear pain   Associated symptoms: no congestion, no fever, no nausea and no vomiting     Past Medical History:  Diagnosis Date   Anxiety    Depression    Lupus (HCC)    PONV (postoperative nausea and vomiting)     Patient Active Problem List   Diagnosis Date Noted   Encounter for postoperative care 01/11/2022   Incomplete emptying of bladder 01/11/2022   Abnormal uterine bleeding (AUB) 12/14/2021   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 11/02/2021   Prior methotrexate therapy 07/28/2021   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   Murmur 09/17/2015    Past Surgical History:  Procedure Laterality Date   CESAREAN SECTION     CHOLECYSTECTOMY     DILATION AND EVACUATION N/A 11/06/2021   Procedure: DILATATION AND EVACUATION;  Surgeon: Mora Bellman, MD;  Location: [facility];  Service: Gynecology;  Laterality: N/A;   FOREARM SURGERY Left    HYSTEROSCOPY WITH D & C  12/14/2021   Procedure: DILATATION AND CURETTAGE /HYSTEROSCOPY;  Surgeon: Griffin Basil, MD;  Location: [facility];  Service: Gynecology;;    OB History     Gravida  4   Para  2   Term  1   Preterm   1   AB  2   Living  2      SAB  1   IAB      Ectopic  1   Multiple      Live Births  2            Home Medications    Prior to Admission medications   Medication Sig Start Date End Date Taking? Authorizing Provider  predniSONE (DELTASONE) 20 MG tablet Take 2 tablets (40 mg total) by mouth daily with breakfast for 5 days. 03/02/22 03/07/22 Yes Francene Finders, PA-C  amoxicillin-clavulanate (AUGMENTIN) 875-125 MG tablet Take 1 tablet by mouth 2 (two) times daily. 02/16/22   [provider]  Drospirenone (SLYND) 4 MG TABS Take 1 tablet by mouth daily. 01/27/22   Griffin Basil, MD  solifenacin (VESICARE) 5 MG tablet Take 1 tablet (5 mg total) by mouth daily. 02/24/22   Jaquita Folds, MD    Family History Family History  Problem Relation Age of Onset   Healthy Mother    Lupus Father     Social History Social History   Tobacco Use   Smoking status: Former   Smokeless tobacco: Never  Scientific laboratory technician Use: Every day   Substances: Nicotine  Substance Use Topics   Alcohol use: No   Drug use: No  Allergies   Morphine and related, Nitrofurantoin, Fentanyl, Hydrocodone, and Chlorhexidine   Review of Systems Review of Systems  Constitutional:  Negative for chills and fever.  HENT:  Positive for ear pain. Negative for congestion.   Eyes:  Negative for discharge and redness.  Respiratory:  Negative for shortness of breath.   Gastrointestinal:  Negative for nausea and vomiting.  Neurological:  Positive for dizziness and headaches.     Physical Exam Triage Vital Signs ED Triage Vitals  Enc Vitals Group     BP 03/02/22 1022 128/82     Pulse Rate 03/02/22 1022 63     Resp 03/02/22 1022 18     Temp 03/02/22 1022 98 F (36.7 C)     Temp Source 03/02/22 1022 Oral     SpO2 03/02/22 1022 97 %     Weight --      Height --      Head Circumference --      Peak Flow --      Pain Score 03/02/22 1025 5     Pain Loc --      Pain Edu? --       Excl. in [place]? --    No data found.  Updated Vital Signs BP 128/82 (BP Location: Left Arm)   Pulse 63   Temp 98 F (36.7 C) (Oral)   Resp 18   LMP 09/07/2021   SpO2 97%     Physical Exam Vitals and nursing note reviewed.  Constitutional:      General: She is not in acute distress.    Appearance: Normal appearance. She is not ill-appearing.  HENT:     Head: Normocephalic and atraumatic.     Right Ear: Tympanic membrane normal.     Left Ear: Tympanic membrane normal.     Ears:     Comments: Bilateral Tms dull    Nose: Congestion present.     Mouth/Throat:     Mouth: Mucous membranes are moist.     Pharynx: No oropharyngeal exudate or posterior oropharyngeal erythema.  Eyes:     Conjunctiva/sclera: Conjunctivae normal.  Cardiovascular:     Rate and Rhythm: Normal rate and regular rhythm.     Heart sounds: Normal heart sounds. No murmur heard. Pulmonary:     Effort: Pulmonary effort is normal. No respiratory distress.     Breath sounds: Normal breath sounds. No wheezing, rhonchi or rales.  Skin:    General: Skin is warm and dry.  Neurological:     Mental Status: She is alert.  Psychiatric:        Mood and Affect: Mood normal.        Thought Content: Thought content normal.      UC Treatments / Results  Labs (all labs ordered are listed, but only abnormal results are displayed) Labs Reviewed - No data to display  EKG   Radiology No results found.  Procedures Procedures (including critical care time)  Medications Ordered in UC Medications - No data to display  Initial Impression / Assessment and Plan / UC Course  I have reviewed the triage vital signs and the nursing notes.  Pertinent labs & imaging results that were available during my care of the patient were reviewed by me and considered in my medical decision making (see chart for details).    No evidence of continued otitis media.  Suspect likely eustachian tube dysfunction, and steroid burst  prescribed for same.  Encouraged follow-up with any further concerns or  persistent symptoms.  Final Clinical Impressions(s) / UC Diagnoses   Final diagnoses:  Dysfunction of right eustachian tube   Discharge Instructions   None    ED Prescriptions     Medication Sig Dispense Auth. Provider   predniSONE (DELTASONE) 20 MG tablet Take 2 tablets (40 mg total) by mouth daily with breakfast for 5 days. 10 tablet Francene Finders, PA-C      PDMP not reviewed this encounter.   Francene Finders, PA-C 03/02/22 1133

## 2022-03-29 ENCOUNTER — Encounter: Payer: Self-pay | Admitting: Obstetrics and Gynecology

## 2022-03-29 DIAGNOSIS — Z30011 Encounter for initial prescription of contraceptive pills: Secondary | ICD-10-CM

## 2022-03-30 ENCOUNTER — Ambulatory Visit
Admission: EM | Admit: 2022-03-30 | Discharge: 2022-03-30 | Disposition: A | Discharge status: Home / Self Care | Payer: Medicaid Other | Attending: Urgent Care | Admitting: Urgent Care

## 2022-03-30 DIAGNOSIS — Z1152 Encounter for screening for COVID-19: Secondary | ICD-10-CM | POA: Diagnosis not present

## 2022-03-30 DIAGNOSIS — B9789 Other viral agents as the cause of diseases classified elsewhere: Secondary | ICD-10-CM | POA: Diagnosis present

## 2022-03-30 DIAGNOSIS — F17290 Nicotine dependence, other tobacco product, uncomplicated: Secondary | ICD-10-CM | POA: Diagnosis not present

## 2022-03-30 DIAGNOSIS — R070 Pain in throat: Secondary | ICD-10-CM | POA: Insufficient documentation

## 2022-03-30 DIAGNOSIS — Z720 Tobacco use: Secondary | ICD-10-CM

## 2022-03-30 DIAGNOSIS — R058 Other specified cough: Secondary | ICD-10-CM | POA: Insufficient documentation

## 2022-03-30 DIAGNOSIS — J988 Other specified respiratory disorders: Secondary | ICD-10-CM | POA: Diagnosis present

## 2022-03-30 LAB — POCT RAPID STREP A (OFFICE): Rapid Strep A Screen: NEGATIVE

## 2022-03-30 MED ORDER — PROMETHAZINE-DM 6.25-15 MG/5ML PO SYRP: 2.5000 mL | ORAL_SOLUTION | Freq: Three times a day (TID) | ORAL | 0 refills | Status: DC | PRN

## 2022-03-30 MED ORDER — CETIRIZINE HCL 10 MG PO TABS: 10.0000 mg | ORAL_TABLET | Freq: Every day | ORAL | 0 refills | Status: DC

## 2022-03-30 MED ORDER — NORETHINDRONE 0.35 MG PO TABS: 1.0000 | ORAL_TABLET | Freq: Every day | ORAL | 11 refills | Status: DC

## 2022-03-30 MED ORDER — PREDNISONE 20 MG PO TABS: ORAL_TABLET | ORAL | 0 refills | Status: DC

## 2022-03-30 NOTE — ED Triage Notes (Signed)
Pt c/o cough, fever, HA x 5 days-NAD-steady gait

## 2022-03-30 NOTE — ED Provider Notes (Signed)
[facility]  Note:  This document was prepared using Systems analyst and may include unintentional dictation errors.  MRN: [id number] DOB: 1990/09/11  Subjective:   Felicia Davila is a 31 y.o. female presenting for 5-day history of acute onset fever, sinus headaches, sinus drainage, congestion, coughing.  No chest pain, shortness of breath or wheezing.  Patient would like a COVID test and strep test.  No history of asthma.  Patient is current every day nicotine vaping.  No current facility-administered medications for this encounter.  Current Outpatient Medications:    amoxicillin-clavulanate (AUGMENTIN) 875-125 MG tablet, Take 1 tablet by mouth 2 (two) times daily., Disp: , Rfl:    Drospirenone (SLYND) 4 MG TABS, Take 1 tablet by mouth daily., Disp: 84 tablet, Rfl: 3   norethindrone (MICRONOR) 0.35 MG tablet, Take 1 tablet (0.35 mg total) by mouth daily., Disp: 28 tablet, Rfl: 11   solifenacin (VESICARE) 5 MG tablet, Take 1 tablet (5 mg total) by mouth daily., Disp: 30 tablet, Rfl: 5   Allergies  Allergen Reactions   Morphine And Related Anaphylaxis, Rash and Other (See Comments)    Per extensive conversation with patient, surgical attending and surgical PA, patient does NOT have an anaphylactic reaction to morphine. Has taken for short periods in the past without issues. Pins and needle feeling in extremities. (??)     Nitrofurantoin Nausea And Vomiting and Other (See Comments)    Also, "felt "weird in the head"    Fentanyl Other (See Comments)    "Makes Pt hot and tingly"   Hydrocodone Other (See Comments)    Pins and needle feeling in extremities   Chlorhexidine Itching and Rash    Past Medical History:  Diagnosis Date   Anxiety    Depression    Lupus ([place])    PONV (postoperative nausea and vomiting)      Past Surgical History:  Procedure Laterality Date   CESAREAN SECTION     CHOLECYSTECTOMY     DILATION AND EVACUATION  N/A 11/06/2021   Procedure: DILATATION AND EVACUATION;  Surgeon: Mora Bellman, MD;  Location: [facility];  Service: Gynecology;  Laterality: N/A;   FOREARM SURGERY Left    HYSTEROSCOPY WITH D & C  12/14/2021   Procedure: DILATATION AND CURETTAGE /HYSTEROSCOPY;  Surgeon: Griffin Basil, MD;  Location[facility];  Service: Gynecology;;    Family History  Problem Relation Age of Onset   Healthy Mother    Lupus Father     Social History   Tobacco Use   Smoking status: Former    Types: Cigarettes   Smokeless tobacco: Never  Vaping Use   Vaping Use: Every day   Substances: Nicotine  Substance Use Topics   Alcohol use: No   Drug use: No    ROS   Objective:   Vitals: BP 116/81 (BP Location: Right Arm)   Pulse (!) 104   Temp 99.4 F (37.4 C) (Oral)   Resp 18   LMP 09/07/2021   SpO2 95%   Physical Exam Constitutional:      General: She is not in acute distress.    Appearance: Normal appearance. She is well-developed and normal weight. She is not ill-appearing, toxic-appearing or diaphoretic.  HENT:     Head: Normocephalic and atraumatic.     Right Ear: Tympanic membrane, ear canal and external ear normal. No drainage or tenderness. No middle ear effusion. There is no impacted cerumen. Tympanic membrane is not erythematous or  bulging.     Left Ear: Tympanic membrane, ear canal and external ear normal. No drainage or tenderness.  No middle ear effusion. There is no impacted cerumen. Tympanic membrane is not erythematous or bulging.     Nose: Congestion present. No rhinorrhea.     Mouth/Throat:     Mouth: Mucous membranes are moist. No oral lesions.     Pharynx: No pharyngeal swelling, oropharyngeal exudate, posterior oropharyngeal erythema or uvula swelling.     Tonsils: No tonsillar exudate or tonsillar abscesses.     Comments: Significant post-nasal drainage overlying pharynx.  Eyes:     General: No scleral icterus.       Right eye: No discharge.        Left eye: No  discharge.     Extraocular Movements: Extraocular movements intact.     Right eye: Normal extraocular motion.     Left eye: Normal extraocular motion.     Conjunctiva/sclera: Conjunctivae normal.  Cardiovascular:     Rate and Rhythm: Normal rate and regular rhythm.     Heart sounds: Normal heart sounds. No murmur heard.    No friction rub. No gallop.  Pulmonary:     Effort: Pulmonary effort is normal. No respiratory distress.     Breath sounds: No stridor. No wheezing, rhonchi or rales.  Chest:     Chest wall: No tenderness.  Musculoskeletal:     Cervical back: Normal range of motion and neck supple.  Lymphadenopathy:     Cervical: No cervical adenopathy.  Skin:    General: Skin is warm and dry.  Neurological:     General: No focal deficit present.     Mental Status: She is alert and oriented to person, place, and time.  Psychiatric:        Mood and Affect: Mood normal.        Behavior: Behavior normal.    Results for orders placed or performed during the hospital encounter of 03/30/22 (from the past 24 hour(s))  POCT rapid strep A     Status: None   Collection Time: 03/30/22  6:18 PM  Result Value Ref Range   Rapid Strep A Screen Negative Negative     Assessment and Plan :   PDMP not reviewed this encounter.  1. Viral respiratory illness   2. Current every day nicotine vaping   3. Throat pain     In the context of respiratory symptoms, vaping recommended a oral prednisone course.  Strep culture and COVID testing pending.  Patient has not a candidate at this stage for Paxlovid but she requested for her work. Deferred imaging given clear cardiopulmonary exam, hemodynamically stable vital signs.  Use supportive care otherwise for an acute viral respiratory illness.  Counseled patient on potential for adverse effects with medications prescribed/recommended today, ER and return-to-clinic precautions discussed, patient verbalized understanding.    Jaynee Eagles,  [place] 03/30/22 1826

## 2022-04-01 LAB — SARS CORONAVIRUS 2 (TAT 6-24 HRS): SARS Coronavirus 2: NEGATIVE

## 2022-04-03 ENCOUNTER — Ambulatory Visit: Payer: Self-pay

## 2022-04-03 LAB — CULTURE, GROUP A STREP (THRC)

## 2022-04-06 NOTE — Progress Notes (Deleted)
[facility] Return Visit  SUBJECTIVE  History of Present Illness: Elfrida Pixley is a 31 y.o. female seen in follow-up for OAB. Plan at last visit was start vesicare '5mg'$  daily and start pelvic floor PT.     Past Medical History: Patient  has a past medical history of Anxiety, Depression, Lupus ([place]), and PONV (postoperative nausea and vomiting).   Past Surgical History: She  has a past surgical history that includes Forearm surgery (Left); Cesarean section; Cholecystectomy; Dilation and evacuation (N/A, 11/06/2021); and Hysteroscopy with D & C (12/14/2021).   Medications: She has a current medication list which includes the following prescription(s): amoxicillin-clavulanate, cetirizine, slynd, norethindrone, prednisone, promethazine-dextromethorphan, and solifenacin.   Allergies: Patient is allergic to morphine and related, nitrofurantoin, fentanyl, hydrocodone, and chlorhexidine.   Social History: Patient  reports that she has quit smoking. Her smoking use included cigarettes. She has never used smokeless tobacco. She reports that she does not drink alcohol and does not use drugs.      OBJECTIVE     Physical Exam: There were no vitals filed for this visit. Gen: No apparent distress, A&O x 3.  Detailed Urogynecologic Evaluation:  Deferred.       ASSESSMENT AND PLAN    Ms. Haverstock is a 31 y.o. with:  No diagnosis found.

## 2022-04-07 ENCOUNTER — Ambulatory Visit: Payer: Medicaid Other | Admitting: Obstetrics and Gynecology

## 2022-05-23 ENCOUNTER — Encounter: Payer: Self-pay | Admitting: Obstetrics and Gynecology

## 2022-05-24 ENCOUNTER — Other Ambulatory Visit: Payer: Self-pay

## 2022-05-24 DIAGNOSIS — Z30012 Encounter for prescription of emergency contraception: Secondary | ICD-10-CM

## 2022-05-24 MED ORDER — ULIPRISTAL ACETATE 30 MG PO TABS: 1.0000 | ORAL_TABLET | Freq: Once | ORAL | 0 refills | Status: AC

## 2022-05-25 MED ORDER — LEVONORGESTREL 1.5 MG PO TABS: 1.5000 mg | ORAL_TABLET | Freq: Once | ORAL | 0 refills | Status: AC

## 2022-05-25 NOTE — Addendum Note (Signed)
Addended by: Louisa Second E on: 05/25/2022 03:07 PM   Modules accepted: Orders

## 2022-06-09 ENCOUNTER — Ambulatory Visit
Admission: RE | Admit: 2022-06-09 | Discharge: 2022-06-09 | Disposition: A | Discharge status: Home / Self Care | Payer: Medicaid Other | Source: Ambulatory Visit

## 2022-06-09 VITALS — BP 120/75 | HR 65 | Temp 98.2°F | Resp 18 | Ht 67.0 in | Wt 158.0 lb

## 2022-06-09 DIAGNOSIS — Z1152 Encounter for screening for COVID-19: Secondary | ICD-10-CM | POA: Insufficient documentation

## 2022-06-09 DIAGNOSIS — R197 Diarrhea, unspecified: Secondary | ICD-10-CM | POA: Insufficient documentation

## 2022-06-09 DIAGNOSIS — J029 Acute pharyngitis, unspecified: Secondary | ICD-10-CM | POA: Insufficient documentation

## 2022-06-09 DIAGNOSIS — R112 Nausea with vomiting, unspecified: Secondary | ICD-10-CM | POA: Diagnosis not present

## 2022-06-09 DIAGNOSIS — J069 Acute upper respiratory infection, unspecified: Secondary | ICD-10-CM | POA: Insufficient documentation

## 2022-06-09 LAB — POCT INFLUENZA A/B
Influenza A, POC: NEGATIVE
Influenza B, POC: NEGATIVE

## 2022-06-09 LAB — POCT RAPID STREP A (OFFICE): Rapid Strep A Screen: NEGATIVE

## 2022-06-09 MED ORDER — CHLORASEPTIC 1.4 % MT LIQD: 1.0000 | OROMUCOSAL | 0 refills | Status: DC | PRN

## 2022-06-09 MED ORDER — BENZONATATE 100 MG PO CAPS: 100.0000 mg | ORAL_CAPSULE | Freq: Three times a day (TID) | ORAL | 0 refills | Status: DC | PRN

## 2022-06-09 MED ORDER — ONDANSETRON 4 MG PO TBDP
4.0000 mg | ORAL_TABLET | Freq: Once | ORAL | Status: AC
  Administered 2022-06-09: 4 mg via ORAL

## 2022-06-09 MED ORDER — FLUTICASONE PROPIONATE 50 MCG/ACT NA SUSP: 1.0000 | Freq: Every day | NASAL | 0 refills | Status: DC

## 2022-06-09 NOTE — Discharge Instructions (Signed)
Strep and rapid flu test are negative.  Throat culture and COVID test are pending.  It appears that you have a viral illness that should run its course.  I have prescribed you 3 medications to help alleviate symptoms.  Ensure adequate fluid hydration and rest.  Follow-up if any symptoms persist or worsen.

## 2022-06-09 NOTE — ED Triage Notes (Signed)
Patient c/o vomiting, diarrhea, sore throat, headache, bodyaches and congestion x 1 day.  Patient has taken Tylenol, Ibuprofen and Sudafed.    Husband had COVID last week. Kids had Strep and Influenza B last week.

## 2022-06-09 NOTE — ED Provider Notes (Signed)
[facility]    CSN: [id number] Arrival date & time: 06/09/22  1107      History   Chief Complaint Chief Complaint  Patient presents with   Influenza    Nausea vomiting diarrhea body aches nasal congestion - Entered by patient    HPI Felicia Davila is a 32 y.o. female.   Patient presents with nausea, vomiting, diarrhea, sore throat, headache, generalized bodyaches, nasal congestion, cough that started yesterday.  Patient has taken Tylenol, ibuprofen, Sudafed for symptoms with minimal improvement.  Patient reports that her husband had COVID last week and her children recently tested positive for strep and influenza B.  Patient denies chest pain, shortness of breath, blood in stool or emesis, abdominal pain.  Denies history of asthma.   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   Murmur 09/17/2015    Past Surgical History:  Procedure Laterality Date   CESAREAN SECTION     CHOLECYSTECTOMY     DILATION AND EVACUATION N/A 11/06/2021   Procedure: DILATATION AND EVACUATION;  Surgeon: Mora Bellman, MD;  Location: [place];  Service: Gynecology;  Laterality: N/A;   FOREARM SURGERY Left    HYSTEROSCOPY WITH D & C  12/14/2021   Procedure: DILATATION AND CURETTAGE /HYSTEROSCOPY;  Surgeon: Griffin Basil, MD;  Location: [place];  Service: Gynecology;;    OB History     Gravida  4   Para  2   Term  1   Preterm  1   AB  2   Living  2      SAB  1   IAB      Ectopic  1   Multiple      Live Births  2            Home  Medications    Prior to Admission medications   Medication Sig Start Date End Date Taking? Authorizing Provider  ALPRAZolam Duanne Moron) 0.5 MG tablet Take 0.5 mg by mouth 2 (two) times daily as needed. 06/03/22  Yes [provider]  benzonatate (TESSALON) 100 MG capsule Take 1 capsule (100 mg total) by mouth every 8 (eight) hours as needed for cough. 06/09/22  Yes , [place] E, FNP  Drospirenone (SLYND) 4 MG TABS Take 1 tablet by mouth daily. 01/27/22  Yes Griffin Basil, MD  escitalopram (LEXAPRO) 10 MG tablet Take 10 mg by mouth daily. 06/03/22  Yes [provider]  fluticasone (FLONASE) 50 MCG/ACT nasal spray Place 1 spray into both nostrils daily. 06/09/22  Yes , Hildred Alamin E, FNP  phenol (CHLORASEPTIC) 1.4 % LIQD Use as directed 1 spray in the mouth or throat as needed for throat irritation / pain. 06/09/22  Yes , Hildred Alamin E, FNP  amoxicillin-clavulanate (AUGMENTIN) 875-125 MG tablet Take 1 tablet by mouth 2 (two) times daily. 02/16/22   [provider]  cetirizine (ZYRTEC ALLERGY) 10 MG tablet Take 1 tablet (10 mg total) by mouth daily. 03/30/22   Jaynee Eagles, PA-C  norethindrone (MICRONOR) 0.35 MG tablet Take 1 tablet (0.35 mg  total) by mouth daily. 03/30/22   Griffin Basil, MD  predniSONE (DELTASONE) 20 MG tablet Take 2 tablets daily with breakfast. 03/30/22   Jaynee Eagles, PA-C  promethazine-dextromethorphan (PROMETHAZINE-DM) 6.25-15 MG/5ML syrup Take 2.5 mLs by mouth 3 (three) times daily as needed for cough. 03/30/22   Jaynee Eagles, PA-C  solifenacin (VESICARE) 5 MG tablet Take 1 tablet (5 mg total) by mouth daily. 02/24/22   Jaquita Folds, MD    Family History Family History  Problem Relation Age of Onset   Healthy Mother    Lupus Father     Social History Social History   Tobacco Use   Smoking status: Former    Types: Cigarettes   Smokeless tobacco: Never  Vaping Use   Vaping Use: Every day   Substances: Nicotine  Substance Use Topics    Alcohol use: No   Drug use: No     Allergies   Morphine and related, Nitrofurantoin, Fentanyl, Hydrocodone, and Chlorhexidine   Review of Systems Review of Systems Per HPI  Physical Exam Triage Vital Signs ED Triage Vitals  Enc Vitals Group     BP 06/09/22 1132 120/75     Pulse Rate 06/09/22 1132 65     Resp 06/09/22 1132 18     Temp 06/09/22 1132 98.2 F (36.8 C)     Temp Source 06/09/22 1132 Oral     SpO2 06/09/22 1132 98 %     Weight 06/09/22 1134 158 lb (71.7 kg)     Height 06/09/22 1134 5' 7"$  (1.702 m)     Head Circumference --      Peak Flow --      Pain Score 06/09/22 1134 7     Pain Loc --      Pain Edu? --      Excl. in [place]? --    No data found.  Updated Vital Signs BP 120/75 (BP Location: Left Arm)   Pulse 65   Temp 98.2 F (36.8 C) (Oral)   Resp 18   Ht 5' 7"$  (1.702 m)   Wt 158 lb (71.7 kg)   LMP 06/07/2022 (Exact Date)   SpO2 98%   BMI 24.75 kg/m   Visual Acuity Right Eye Distance:   Left Eye Distance:   Bilateral Distance:    Right Eye Near:   Left Eye Near:    Bilateral Near:     Physical Exam Constitutional:      General: She is not in acute distress.    Appearance: Normal appearance. She is not toxic-appearing or diaphoretic.  HENT:     Head: Normocephalic and atraumatic.     Right Ear: Tympanic membrane and ear canal normal.     Left Ear: Tympanic membrane and ear canal normal.     Nose: Congestion present.     Mouth/Throat:     Mouth: Mucous membranes are moist.     Pharynx: Posterior oropharyngeal erythema present.  Eyes:     Extraocular Movements: Extraocular movements intact.     Conjunctiva/sclera: Conjunctivae normal.     Pupils: Pupils are equal, round, and reactive to light.  Cardiovascular:     Rate and Rhythm: Normal rate and regular rhythm.     Pulses: Normal pulses.     Heart sounds: Normal heart sounds.  Pulmonary:     Effort: Pulmonary effort is normal. No respiratory distress.     Breath sounds: Normal  breath sounds. No stridor. No wheezing, rhonchi or rales.  Abdominal:  General: Abdomen is flat. Bowel sounds are normal. There is no distension.     Palpations: Abdomen is soft.     Tenderness: There is no abdominal tenderness.  Musculoskeletal:        General: Normal range of motion.     Cervical back: Normal range of motion.  Skin:    General: Skin is warm and dry.  Neurological:     General: No focal deficit present.     Mental Status: She is alert and oriented to person, place, and time. Mental status is at baseline.  Psychiatric:        Mood and Affect: Mood normal.        Behavior: Behavior normal.      UC Treatments / Results  Labs (all labs ordered are listed, but only abnormal results are displayed) Labs Reviewed  CULTURE, GROUP A STREP ([place])  SARS CORONAVIRUS 2 (TAT 6-24 HRS)  POCT RAPID STREP A (OFFICE)  POCT INFLUENZA A/B    EKG   Radiology No results found.  Procedures Procedures (including critical care time)  Medications Ordered in UC Medications  ondansetron (ZOFRAN-ODT) disintegrating tablet 4 mg (4 mg Oral Given 06/09/22 1152)    Initial Impression / Assessment and Plan / UC Course  I have reviewed the triage vital signs and the nursing notes.  Pertinent labs & imaging results that were available during my care of the patient were reviewed by me and considered in my medical decision making (see chart for details).     Patient presents with symptoms likely from a viral upper respiratory infection. Do not suspect underlying cardiopulmonary process. Symptoms seem unlikely related to ACS, CHF or COPD exacerbations, pneumonia, pneumothorax. Patient is nontoxic appearing and not in need of emergent medical intervention.  Rapid strep and rapid flu are negative.  Do not have flu PCR capabilities here in urgent care at this time.  COVID test pending.   Recommended symptom control with medications and supportive care.  Patient sent prescriptions.   Advised adequate fluid hydration and rest.  There are no signs of acute abdomen or dehydration on exam at this time so do not think that emergent evaluation is necessary.  Bland diet for diarrhea.  Will avoid ondansetron given patient takes Lexapro and this could cause QT prolongation.  Return if symptoms fail to improve in 1-2 weeks or you develop shortness of breath, chest pain, severe headache. Patient states understanding and is agreeable.  Discharged with PCP followup.  Final Clinical Impressions(s) / UC Diagnoses   Final diagnoses:  Viral upper respiratory tract infection with cough  Nausea vomiting and diarrhea  Sore throat     Discharge Instructions      Strep and rapid flu test are negative.  Throat culture and COVID test are pending.  It appears that you have a viral illness that should run its course.  I have prescribed you 3 medications to help alleviate symptoms.  Ensure adequate fluid hydration and rest.  Follow-up if any symptoms persist or worsen.     ED Prescriptions     Medication Sig Dispense Auth. Provider   fluticasone (FLONASE) 50 MCG/ACT nasal spray Place 1 spray into both nostrils daily. 16 g , Hildred Alamin E, [place]   benzonatate (TESSALON) 100 MG capsule Take 1 capsule (100 mg total) by mouth every 8 (eight) hours as needed for cough. 21 capsule [place]brandt, [place] E, [place]   phenol (CHLORASEPTIC) 1.4 % LIQD Use as directed 1 spray in the mouth or throat  as needed for throat irritation / pain. 118 mL Teodora Medici, [place]      PDMP not reviewed this encounter.   Teodora Medici, [place] 06/09/22 1217

## 2022-06-10 LAB — SARS CORONAVIRUS 2 (TAT 6-24 HRS): SARS Coronavirus 2: NEGATIVE

## 2022-06-12 LAB — CULTURE, GROUP A STREP (THRC)

## 2022-07-08 ENCOUNTER — Other Ambulatory Visit: Payer: Self-pay

## 2022-07-08 ENCOUNTER — Encounter: Payer: Self-pay | Admitting: Obstetrics and Gynecology

## 2022-07-08 DIAGNOSIS — Z30011 Encounter for initial prescription of contraceptive pills: Secondary | ICD-10-CM

## 2022-07-08 MED ORDER — SLYND 4 MG PO TABS: 1.0000 | ORAL_TABLET | Freq: Every day | ORAL | 3 refills | Status: DC

## 2022-07-13 ENCOUNTER — Ambulatory Visit
Admission: RE | Admit: 2022-07-13 | Discharge: 2022-07-13 | Disposition: A | Discharge status: Home / Self Care | Payer: Medicaid Other | Source: Ambulatory Visit | Attending: Physician Assistant | Admitting: Physician Assistant

## 2022-07-13 ENCOUNTER — Other Ambulatory Visit: Payer: Self-pay

## 2022-07-13 VITALS — BP 108/69 | HR 69 | Temp 98.4°F | Resp 18

## 2022-07-13 DIAGNOSIS — K5289 Other specified noninfective gastroenteritis and colitis: Secondary | ICD-10-CM | POA: Diagnosis not present

## 2022-07-13 DIAGNOSIS — R112 Nausea with vomiting, unspecified: Secondary | ICD-10-CM

## 2022-07-13 MED ORDER — ONDANSETRON HCL 4 MG PO TABS: 4.0000 mg | ORAL_TABLET | Freq: Three times a day (TID) | ORAL | 0 refills | Status: DC | PRN

## 2022-07-13 MED ORDER — ONDANSETRON 4 MG PO TBDP
4.0000 mg | ORAL_TABLET | Freq: Once | ORAL | Status: AC
  Administered 2022-07-13: 4 mg via ORAL

## 2022-07-13 NOTE — ED Triage Notes (Signed)
Pt reports her children have been sick and she now has been vomiting with diarrhea . Pt reports Sx's started yesterday.

## 2022-07-13 NOTE — Discharge Instructions (Signed)
Advised take the Zofran 4 mg every 8 hours on a regular basis to help decrease nausea and vomiting.  Advised clear liquid diet and Gatorade, Pedia-Pop's.  Advised bland diet over the next 48 hours.  Advised follow-up PCP or return to urgent care as needed.

## 2022-07-13 NOTE — ED Provider Notes (Signed)
[facility]    CSN: [id number] Arrival date & time: 07/13/22  1241      History   Chief Complaint Chief Complaint  Patient presents with   Emesis   Diarrhea    HPI Felicia Davila is a 32 y.o. female.   32 year old female presents with nausea and vomiting.  Patient indicates that her children had nausea, vomiting, and diarrhea last week.  The patient indicates over the past 2 days she started having stomach cramping, became nauseated, and then started having episodes of throwing up.  Patient indicates that shortly after she started having episodes of diarrhea.  She indicates that this has been intermittently continuous over the past 24 hours.  She indicates that last time she threw up was about an hour ago as well as with diarrhea.  Patient indicates she has been trying to drink clear liquids.  She does have fatigue, lethargy with her symptoms.  She indicates she did have a mild low-grade fever when the symptoms started along with chills, body aches and discomfort.   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   Murmur 09/17/2015    Past Surgical History:  Procedure Laterality Date   CESAREAN SECTION     CHOLECYSTECTOMY     DILATION AND EVACUATION N/A 11/06/2021   Procedure: DILATATION AND EVACUATION;  Surgeon: Mora Bellman, MD;  Location: [place];  Service: Gynecology;  Laterality: N/A;   FOREARM SURGERY Left    HYSTEROSCOPY WITH D & C  12/14/2021   Procedure:  DILATATION AND CURETTAGE /HYSTEROSCOPY;  Surgeon: Griffin Basil, MD;  Location: [place];  Service: Gynecology;;    OB History     Gravida  4   Para  2   Term  1   Preterm  1   AB  2   Living  2      SAB  1   IAB      Ectopic  1   Multiple      Live Births  2            Home Medications    Prior to Admission medications   Medication Sig Start Date End Date Taking? Authorizing Provider  ondansetron (ZOFRAN) 4 MG tablet Take 1 tablet (4 mg total) by mouth every 8 (eight) hours as needed for nausea or vomiting. 07/13/22  Yes Nyoka Lint, PA-C  ALPRAZolam Duanne Moron) 0.5 MG tablet Take 0.5 mg by mouth 2 (two) times daily as needed. 06/03/22   [provider]  amoxicillin-clavulanate (AUGMENTIN) 875-125 MG tablet Take 1 tablet by mouth 2 (two) times daily. 02/16/22   [provider]  benzonatate (TESSALON) 100 MG capsule Take 1 capsule (100 mg total) by mouth every 8 (eight) hours as needed for cough. 06/09/22   Teodora Medici, FNP  cetirizine (ZYRTEC ALLERGY) 10 MG tablet Take 1 tablet (10 mg total) by mouth daily. 03/30/22   Jaynee Eagles, PA-C  Drospirenone (SLYND) 4 MG TABS Take 1 tablet (  4 mg total) by mouth daily. 07/08/22   Griffin Basil, MD  escitalopram (LEXAPRO) 10 MG tablet Take 10 mg by mouth daily. 06/03/22   [provider]  fluticasone (FLONASE) 50 MCG/ACT nasal spray Place 1 spray into both nostrils daily. 06/09/22   Teodora Medici, FNP  norethindrone (MICRONOR) 0.35 MG tablet Take 1 tablet (0.35 mg total) by mouth daily. 03/30/22   Griffin Basil, MD  phenol (CHLORASEPTIC) 1.4 % LIQD Use as directed 1 spray in the mouth or throat as needed for throat irritation / pain. 06/09/22   Teodora Medici, FNP  predniSONE (DELTASONE) 20 MG tablet Take 2 tablets daily with breakfast. 03/30/22   Jaynee Eagles, PA-C  promethazine-dextromethorphan (PROMETHAZINE-DM) 6.25-15 MG/5ML syrup Take 2.5 mLs by mouth 3 (three) times daily as needed for cough. 03/30/22    Jaynee Eagles, PA-C  solifenacin (VESICARE) 5 MG tablet Take 1 tablet (5 mg total) by mouth daily. 02/24/22   Jaquita Folds, MD    Family History Family History  Problem Relation Age of Onset   Healthy Mother    Lupus Father     Social History Social History   Tobacco Use   Smoking status: Former    Types: Cigarettes   Smokeless tobacco: Never  Vaping Use   Vaping Use: Every day   Substances: Nicotine  Substance Use Topics   Alcohol use: No   Drug use: No     Allergies   Morphine and related, Nitrofurantoin, Fentanyl, Hydrocodone, and Chlorhexidine   Review of Systems Review of Systems  Gastrointestinal:  Positive for diarrhea and vomiting.     Physical Exam Triage Vital Signs ED Triage Vitals  Enc Vitals Group     BP 07/13/22 1321 108/69     Pulse Rate 07/13/22 1321 69     Resp 07/13/22 1321 18     Temp 07/13/22 1321 98.4 F (36.9 C)     Temp src --      SpO2 07/13/22 1321 98 %     Weight --      Height --      Head Circumference --      Peak Flow --      Pain Score 07/13/22 1319 0     Pain Loc --      Pain Edu? --      Excl. in [place]? --    No data found.  Updated Vital Signs BP 108/69   Pulse 69   Temp 98.4 F (36.9 C)   Resp 18   LMP  (LMP Unknown) Comment: pt skips placebo pill  SpO2 98%   Visual Acuity Right Eye Distance:   Left Eye Distance:   Bilateral Distance:    Right Eye Near:   Left Eye Near:    Bilateral Near:     Physical Exam Constitutional:      Appearance: Normal appearance.  HENT:     Mouth/Throat:     Mouth: Mucous membranes are moist.     Pharynx: Oropharynx is clear.  Abdominal:     General: Abdomen is flat. Bowel sounds are increased.     Palpations: Abdomen is soft.     Tenderness: There is no abdominal tenderness. There is no guarding or rebound.  Lymphadenopathy:     Cervical: No cervical adenopathy.  Neurological:     Mental Status: She is alert.      UC Treatments / Results  Labs (all  labs ordered are listed, but only abnormal  results are displayed) Labs Reviewed - No data to display  EKG   Radiology No results found.  Procedures Procedures (including critical care time)  Medications Ordered in UC Medications  ondansetron (ZOFRAN-ODT) disintegrating tablet 4 mg (has no administration in time range)    Initial Impression / Assessment and Plan / UC Course  I have reviewed the triage vital signs and the nursing notes.  Pertinent labs & imaging results that were available during my care of the patient were reviewed by me and considered in my medical decision making (see chart for details).    Plan: The diagnosis will be treated with the following: 1.  Nausea and vomiting: A.  Zofran 4 mg given in the office today. B.  Zofran 4 mg every 8 hours on a regular basis to decrease nausea and vomiting. 2.  Gastroenteritis: A.  Zofran 4 mg every 8 hours on a regular basis to decrease nausea and vomiting. B.  Advised to increase fluid intake clear liquids, Gatorade, Pedia-Pop's.  Advised a bland diet over the next 48-72 hours. 3.  Advised follow-up PCP return to urgent care as needed.  Final Clinical Impressions(s) / UC Diagnoses   Final diagnoses:  Other noninfectious gastroenteritis  Nausea and vomiting, unspecified vomiting type     Discharge Instructions      Advised take the Zofran 4 mg every 8 hours on a regular basis to help decrease nausea and vomiting.  Advised clear liquid diet and Gatorade, Pedia-Pop's.  Advised bland diet over the next 48 hours.  Advised follow-up PCP or return to urgent care as needed.    ED Prescriptions     Medication Sig Dispense Auth. Provider   ondansetron (ZOFRAN) 4 MG tablet Take 1 tablet (4 mg total) by mouth every 8 (eight) hours as needed for nausea or vomiting. 20 tablet Nyoka Lint, PA-C      PDMP not reviewed this encounter.   Nyoka Lint, PA-C 07/13/22 1338

## 2022-07-15 ENCOUNTER — Encounter: Payer: Self-pay | Admitting: Obstetrics and Gynecology

## 2022-07-16 NOTE — Progress Notes (Deleted)
[facility] Return Visit  SUBJECTIVE  History of Present Illness: Felicia Davila is a 32 y.o. female seen in follow-up for OAB. Plan at last visit was Start vesicare and pelvic floor PT.     Past Medical History: Patient  has a past medical history of Anxiety, Depression, Lupus ([employer]), and PONV (postoperative nausea and vomiting).   Past Surgical History: She  has a past surgical history that includes Forearm surgery (Left); Cesarean section; Cholecystectomy; Dilation and evacuation (N/A, 11/06/2021); and Hysteroscopy with D & C (12/14/2021).   Medications: She has a current medication list which includes the following prescription(s): alprazolam, amoxicillin-clavulanate, benzonatate, cetirizine, slynd, escitalopram, fluticasone, norethindrone, ondansetron, chloraseptic, prednisone, promethazine-dextromethorphan, and solifenacin.   Allergies: Patient is allergic to morphine and related, nitrofurantoin, fentanyl, hydrocodone, and chlorhexidine.   Social History: Patient  reports that she has quit smoking. Her smoking use included cigarettes. She has never used smokeless tobacco. She reports that she does not drink alcohol and does not use drugs.      OBJECTIVE     Physical Exam: There were no vitals filed for this visit. Gen: No apparent distress, A&O x 3.  Detailed Urogynecologic Evaluation:  Deferred. Prior exam showed:      No data to display             ASSESSMENT AND PLAN    Felicia Davila is a 32 y.o. with:  No diagnosis found.

## 2022-07-19 ENCOUNTER — Ambulatory Visit: Payer: Medicaid Other | Admitting: Obstetrics and Gynecology

## 2022-08-16 ENCOUNTER — Emergency Department (HOSPITAL_COMMUNITY)
Admission: EM | Admit: 2022-08-16 | Discharge: 2022-08-16 | Disposition: A | Discharge status: Home / Self Care | Payer: Medicaid Other | Source: Home / Self Care

## 2022-08-23 ENCOUNTER — Ambulatory Visit
Admission: EM | Admit: 2022-08-23 | Discharge: 2022-08-23 | Discharge status: Against Medical Advice | Payer: Medicaid Other

## 2022-08-23 ENCOUNTER — Ambulatory Visit (HOSPITAL_COMMUNITY): Payer: Self-pay

## 2022-08-23 ENCOUNTER — Other Ambulatory Visit: Payer: Self-pay

## 2022-09-03 ENCOUNTER — Ambulatory Visit
Admission: RE | Admit: 2022-09-03 | Discharge: 2022-09-03 | Disposition: A | Discharge status: Home / Self Care | Payer: Medicaid Other | Source: Ambulatory Visit | Attending: Family Medicine | Admitting: Family Medicine

## 2022-09-03 VITALS — BP 104/71 | HR 71 | Temp 97.9°F | Resp 16

## 2022-09-03 DIAGNOSIS — J020 Streptococcal pharyngitis: Secondary | ICD-10-CM

## 2022-09-03 DIAGNOSIS — J039 Acute tonsillitis, unspecified: Secondary | ICD-10-CM

## 2022-09-03 LAB — POCT RAPID STREP A (OFFICE): Rapid Strep A Screen: POSITIVE — AB

## 2022-09-03 MED ORDER — AMOXICILLIN-POT CLAVULANATE 875-125 MG PO TABS: 1.0000 | ORAL_TABLET | Freq: Two times a day (BID) | ORAL | 0 refills | Status: AC

## 2022-09-03 MED ORDER — FLUCONAZOLE 150 MG PO TABS: 150.0000 mg | ORAL_TABLET | Freq: Every day | ORAL | 0 refills | Status: AC

## 2022-09-03 NOTE — ED Provider Notes (Signed)
[facility]    CSN: [id number] Arrival date & time: 09/03/22  1340      History   Chief Complaint Chief Complaint  Patient presents with   Sore Throat    strep - Entered by patient    HPI Felicia Davila is a 32 y.o. female.   Patient is here for continued sore throat.  She was dx with strep A 1 week ago, finished PCN yesterday x 7 days. The symptoms did improve in them middle of taking them, but then symptoms continued.  She woke up today with worsening pain, swelling of her neck.  She has pain and swelling to the left side of her neck especially.  She has had a fever, but took motrin for that.  She did change her toothbrush, but did not change her vape.  No risk for stds, married.  No runny nose, congestion. But has cough, with dark mucous.  She is losing her voice as well.        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   Murmur 09/17/2015    Past Surgical History:  Procedure Laterality Date   CESAREAN SECTION     CHOLECYSTECTOMY     DILATION AND EVACUATION N/A 11/06/2021   Procedure: DILATATION AND EVACUATION;  Surgeon: Catalina Antigua, MD;  Location: [facility] OR;  Service: Gynecology;  Laterality: N/A;   FOREARM SURGERY Left    HYSTEROSCOPY WITH D & C  12/14/2021   Procedure: DILATATION AND CURETTAGE /HYSTEROSCOPY;  Surgeon: Warden Fillers, MD;  Location: [facility] OR;  Service: Gynecology;;    OB History     Gravida  4   Para  2   Term  1   Preterm  1   AB  2   Living  2      SAB  1   IAB      Ectopic  1   Multiple       Live Births  2            Home Medications    Prior to Admission medications   Medication Sig Start Date End Date Taking? Authorizing Provider  ALPRAZolam Prudy Feeler) 0.5 MG tablet Take 0.5 mg by mouth 2 (two) times daily as needed. 06/03/22   [provider]  amoxicillin-clavulanate (AUGMENTIN) 875-125 MG tablet Take 1 tablet by mouth 2 (two) times daily. 02/16/22   [provider]  benzonatate (TESSALON) 100 MG capsule Take 1 capsule (100 mg total) by mouth every 8 (eight) hours as needed for cough. 06/09/22   Gustavus Bryant, FNP  cetirizine (ZYRTEC ALLERGY) 10 MG tablet Take 1 tablet (10 mg total) by mouth daily. 03/30/22   Wallis Bamberg, PA-C  Drospirenone (SLYND) 4 MG TABS Take 1 tablet (4 mg total) by mouth daily. 07/08/22   Warden Fillers, MD  escitalopram (LEXAPRO) 10 MG tablet Take 10 mg by mouth daily. 06/03/22   [provider]  fluticasone (FLONASE) 50 MCG/ACT nasal spray Place 1 spray into both nostrils daily. 06/09/22   Gustavus Bryant, FNP  norethindrone (MICRONOR) 0.35 MG tablet Take 1 tablet (0.35 mg total) by mouth daily. 03/30/22   Warden Fillers, MD  ondansetron (ZOFRAN) 4 MG tablet Take 1 tablet (4 mg total) by mouth every 8 (eight) hours as needed for nausea or vomiting. 07/13/22   Ellsworth Lennox, PA-C  phenol (CHLORASEPTIC) 1.4 % LIQD Use as directed 1 spray in the mouth or throat as needed for throat irritation / pain. 06/09/22   Gustavus Bryant, FNP  predniSONE (DELTASONE) 20 MG tablet Take 2 tablets daily with breakfast. 03/30/22   Wallis Bamberg, PA-C  promethazine-dextromethorphan (PROMETHAZINE-DM) 6.25-15 MG/5ML syrup Take 2.5 mLs by mouth 3 (three) times daily as needed for cough. 03/30/22   Wallis Bamberg, PA-C  solifenacin (VESICARE) 5 MG tablet Take 1 tablet (5 mg total) by mouth daily. 02/24/22   Marguerita Beards, MD    Family History Family History  Problem Relation Age of Onset   Healthy Mother    Lupus Father     Social History Social  History   Tobacco Use   Smoking status: Former    Types: Cigarettes   Smokeless tobacco: Never  Vaping Use   Vaping Use: Every day   Substances: Nicotine  Substance Use Topics   Alcohol use: No   Drug use: No     Allergies   Morphine and related, Nitrofurantoin, Fentanyl, Hydrocodone, and Chlorhexidine   Review of Systems Review of Systems  Constitutional:  Positive for fever. Negative for chills.  HENT:  Positive for sore throat. Negative for congestion and rhinorrhea.   Respiratory:  Positive for cough. Negative for shortness of breath.   Cardiovascular: Negative.   Gastrointestinal: Negative.   Genitourinary: Negative.   Musculoskeletal: Negative.   Psychiatric/Behavioral: Negative.       Physical Exam Triage Vital Signs ED Triage Vitals  Enc Vitals Group     BP 09/03/22 1424 104/71     Pulse Rate 09/03/22 1424 71     Resp 09/03/22 1424 16     Temp 09/03/22 1424 97.9 F (36.6 C)     Temp Source 09/03/22 1424 Oral     SpO2 09/03/22 1424 98 %     Weight --      Height --      Head Circumference --      Peak Flow --      Pain Score 09/03/22 1421 7     Pain Loc --      Pain Edu? --      Excl. in GC? --    No data found.  Updated Vital Signs BP 104/71 (BP Location: Left Arm)   Pulse 71   Temp 97.9 F (36.6 C) (Oral)   Resp 16   SpO2 98%   Visual Acuity Right Eye Distance:   Left Eye Distance:   Bilateral Distance:    Right Eye Near:   Left Eye Near:    Bilateral Near:     Physical Exam Constitutional:      Appearance: She is well-developed.  HENT:     Nose: No congestion or rhinorrhea.     Mouth/Throat:     Mouth: Mucous membranes are moist.     Pharynx: Pharyngeal swelling, oropharyngeal exudate and posterior oropharyngeal erythema present.     Tonsils: Tonsillar exudate present. 1+ on the right. 1+ on the left.  Cardiovascular:     Rate and Rhythm: Normal rate and regular rhythm.     Heart sounds: Normal heart sounds.  Pulmonary:  Effort: Pulmonary effort is normal.  Musculoskeletal:     Cervical back: Normal range of motion and neck supple.  Lymphadenopathy:     Cervical: Cervical adenopathy present.  Skin:    General: Skin is warm.  Neurological:     General: No focal deficit present.     Mental Status: She is alert.  Psychiatric:        Mood and Affect: Mood normal.      UC Treatments / Results  Labs (all labs ordered are listed, but only abnormal results are displayed) Labs Reviewed  POCT RAPID STREP A (OFFICE) - Abnormal; Notable for the following components:      Result Value   Rapid Strep A Screen Positive (*)    All other components within normal limits    EKG   Radiology No results found.  Procedures Procedures (including critical care time)  Medications Ordered in UC Medications - No data to display  Initial Impression / Assessment and Plan / UC Course  I have reviewed the triage vital signs and the nursing notes.  Pertinent labs & imaging results that were available during my care of the patient were reviewed by me and considered in my medical decision making (see chart for details).  Final Clinical Impressions(s) / UC Diagnoses   Final diagnoses:  Acute tonsillitis, unspecified etiology  Streptococcal sore throat     Discharge Instructions      You were seen today for continued sore throat.  I have sent out augmentin twice/day x 10 days.  Please change your toothbrush and bedding/pillow cases.  You may use tylenol for pain/fever.  Your strep test was positive today as well.     ED Prescriptions     Medication Sig Dispense Auth. Provider   amoxicillin-clavulanate (AUGMENTIN) 875-125 MG tablet Take 1 tablet by mouth every 12 (twelve) hours for 10 days. 20 tablet Marieke Lubke, MD   fluconazole (DIFLUCAN) 150 MG tablet Take 1 tablet (150 mg total) by mouth daily for 2 doses. 2 tablet Jannifer Franklin, MD      PDMP not reviewed this encounter.   Jannifer Franklin,  MD 09/03/22 [phone]

## 2022-09-03 NOTE — Discharge Instructions (Addendum)
You were seen today for continued sore throat.  I have sent out augmentin twice/day x 10 days.  Please change your toothbrush and bedding/pillow cases.  You may use tylenol for pain/fever.  Your strep test was positive today as well.

## 2022-09-03 NOTE — ED Triage Notes (Signed)
Pt said was on penicillin for strep and is still having issues with her throat and still have swollen tonsils. Pt said feels like razor blades.

## 2022-09-29 ENCOUNTER — Emergency Department (HOSPITAL_COMMUNITY)
Admission: EM | Admit: 2022-09-29 | Discharge: 2022-09-29 | Disposition: A | Discharge status: Home / Self Care | Payer: Medicaid Other | Attending: Emergency Medicine | Admitting: Emergency Medicine

## 2022-09-29 ENCOUNTER — Other Ambulatory Visit: Payer: Self-pay

## 2022-09-29 ENCOUNTER — Ambulatory Visit
Admission: EM | Admit: 2022-09-29 | Discharge: 2022-09-29 | Disposition: A | Discharge status: Home / Self Care | Payer: Medicaid Other

## 2022-09-29 ENCOUNTER — Inpatient Hospital Stay
Admission: RE | Admit: 2022-09-29 | Discharge: 2022-09-29 | Disposition: A | Discharge status: Home / Self Care | Payer: Self-pay | Source: Ambulatory Visit

## 2022-09-29 ENCOUNTER — Emergency Department (HOSPITAL_COMMUNITY): Payer: Medicaid Other

## 2022-09-29 ENCOUNTER — Encounter (HOSPITAL_COMMUNITY): Payer: Self-pay

## 2022-09-29 DIAGNOSIS — R6884 Jaw pain: Secondary | ICD-10-CM

## 2022-09-29 DIAGNOSIS — M791 Myalgia, unspecified site: Secondary | ICD-10-CM | POA: Insufficient documentation

## 2022-09-29 MED ORDER — OXYCODONE-ACETAMINOPHEN 5-325 MG PO TABS
1.0000 | ORAL_TABLET | Freq: Once | ORAL | Status: AC
  Administered 2022-09-29: 1 via ORAL
  Filled 2022-09-29: qty 1
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
## 2022-09-29 NOTE — Discharge Instructions (Signed)
Go to the emergency department as soon as you leave urgent care for further evaluation and management. 
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
## 2022-09-29 NOTE — Discharge Instructions (Addendum)
Your CT scan does not show any fractures.  I recommend ice compresses, Tylenol and ibuprofen as discussed below.  Please use Tylenol or ibuprofen for pain.  You may use 600 mg ibuprofen every 6 hours or 1000 mg of Tylenol every 6 hours.  You may choose to alternate between the 2.  This would be most effective.  Not to exceed 4 g of Tylenol within 24 hours.  Not to exceed 3200 mg ibuprofen 24 hours.

## 2022-10-02 ENCOUNTER — Ambulatory Visit (HOSPITAL_COMMUNITY): Payer: Self-pay

## 2022-10-03 ENCOUNTER — Ambulatory Visit (HOSPITAL_COMMUNITY): Payer: Self-pay

## 2022-10-04 ENCOUNTER — Ambulatory Visit: Payer: Self-pay

## 2022-10-15 ENCOUNTER — Encounter (HOSPITAL_COMMUNITY): Payer: Self-pay

## 2022-10-15 ENCOUNTER — Other Ambulatory Visit (HOSPITAL_COMMUNITY): Payer: Medicaid Other

## 2022-10-15 ENCOUNTER — Other Ambulatory Visit: Payer: Self-pay

## 2022-10-15 ENCOUNTER — Ambulatory Visit (INDEPENDENT_AMBULATORY_CARE_PROVIDER_SITE_OTHER): Payer: Medicaid Other

## 2022-10-15 ENCOUNTER — Ambulatory Visit (HOSPITAL_COMMUNITY)
Admission: RE | Admit: 2022-10-15 | Discharge: 2022-10-15 | Disposition: A | Discharge status: Home / Self Care | Payer: Medicaid Other | Source: Ambulatory Visit

## 2022-10-15 VITALS — BP 120/79 | HR 74 | Temp 98.3°F | Resp 18

## 2022-10-15 DIAGNOSIS — M7661 Achilles tendinitis, right leg: Secondary | ICD-10-CM

## 2022-10-15 MED ORDER — PREDNISONE 20 MG PO TABS: ORAL_TABLET | ORAL | 0 refills | Status: AC

## 2022-10-15 NOTE — ED Provider Notes (Addendum)
[facility]    CSN: [id number] Arrival date & time: 10/15/22  1454      History   Chief Complaint Chief Complaint  Patient presents with   Foot Injury    Possible heel fracture - Entered by patient    HPI Felicia Davila is a 32 y.o. female.   Patient presents to urgent care for evaluation of right heel pain that started 1 week ago. States she jumped off of a 4 foot platform at work a week ago. The jump from the 4 foot platform is out of the ordinary for her due to conditions at work that day. Felicia Davila is normally able to get down from the platform via steps, however these were not available that day due to safety concerns with the machine and she was instructed to jump down from the platform by supervisor. She believes this triggered pain. She did not injure her ankle. Job is very physical and she works 12 hour shifts on her feet. She has been going to work despite pain to the right heel. Pain to the right heel is starting to radiate proximally to the achilles tendon and to the right calf. No numbness or tingling distally, no ankle pain. She has been taking tylenol, ibuprofen, and using epsom salt soaks without relief. History of lupus.    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   Murmur 09/17/2015    Past Surgical History:  Procedure Laterality Date   CESAREAN SECTION     CHOLECYSTECTOMY     DILATION AND EVACUATION N/A 11/06/2021   Procedure: DILATATION AND EVACUATION;  Surgeon: Catalina Antigua, MD;  Location: [facility];  Service:  Gynecology;  Laterality: N/A;   FOREARM SURGERY Left    HYSTEROSCOPY WITH D & C  12/14/2021   Procedure: DILATATION AND CURETTAGE /HYSTEROSCOPY;  Surgeon: Warden Fillers, MD;  Location: [facility];  Service: Gynecology;;    OB History     Gravida  4   Para  2   Term  1   Preterm  1   AB  2   Living  2      SAB  1   IAB      Ectopic  1   Multiple      Live Births  2            Home Medications    Prior to Admission medications   Medication Sig Start Date End Date Taking? Authorizing Provider  Fluoxetine HCl, PMDD, 20 MG TABS Take by mouth. 07/30/20  Yes [provider]  predniSONE (DELTASONE) 20 MG tablet Take 1 tablet (20 mg total) by mouth daily for 1 day, THEN 2 tablets (40 mg total) daily for 5 days. 10/15/22 10/21/22 Yes Krisi Azua, Donavan Burnet, FNP  ALPRAZolam Prudy Feeler) 0.5 MG tablet Take 0.5 mg by mouth 2 (two) times daily as needed. 06/03/22   [provider]  cetirizine (ZYRTEC ALLERGY) 10 MG tablet Take 1 tablet (10 mg total) by mouth daily. 03/30/22   Wallis Bamberg, PA-C  Drospirenone (SLYND) 4 MG TABS Take 1 tablet (4 mg total) by mouth daily. 07/08/22   Warden Fillers, MD  escitalopram (LEXAPRO) 10 MG tablet Take 10 mg by mouth daily. Patient not taking: Reported on 10/15/2022 06/03/22   [provider]  fluticasone (FLONASE) 50 MCG/ACT nasal spray Place 1 spray into both nostrils daily. 06/09/22   Gustavus Bryant, FNP  norethindrone (MICRONOR) 0.35 MG tablet Take 1 tablet (0.35 mg total) by mouth daily. Patient not taking: Reported on 10/15/2022 03/30/22   Warden Fillers, MD  ondansetron (ZOFRAN) 4 MG tablet Take 1 tablet (4 mg total) by mouth every 8 (eight) hours as needed for nausea or vomiting. 07/13/22   Ellsworth Lennox, PA-C  phenol (CHLORASEPTIC) 1.4 % LIQD Use as directed 1 spray in the mouth or throat as needed for throat irritation / pain. 06/09/22   Gustavus Bryant, FNP  promethazine-dextromethorphan (PROMETHAZINE-DM) 6.25-15 MG/5ML syrup  Take 2.5 mLs by mouth 3 (three) times daily as needed for cough. Patient not taking: Reported on 10/15/2022 03/30/22   Wallis Bamberg, PA-C  solifenacin (VESICARE) 5 MG tablet Take 1 tablet (5 mg total) by mouth daily. 02/24/22   Marguerita Beards, MD    Family History Family History  Problem Relation Age of Onset   Healthy Mother    Lupus Father     Social History Social History   Tobacco Use   Smoking status: Former    Types: Cigarettes   Smokeless tobacco: Never  Vaping Use   Vaping Use: Every day   Substances: Nicotine  Substance Use Topics   Alcohol use: No   Drug use: No     Allergies   Morphine and codeine, Nitrofurantoin, Fentanyl, Hydrocodone, and Chlorhexidine   Review of Systems Review of Systems Per HPI  Physical Exam Triage Vital Signs ED Triage Vitals  Enc Vitals Group     BP 10/15/22 1519 120/79     Pulse Rate 10/15/22 1519 74     Resp 10/15/22 1519 18     Temp 10/15/22 1519 98.3 F (36.8 C)     Temp Source 10/15/22 1519 Oral     SpO2 10/15/22 1519 97 %     Weight --      Height --      Head Circumference --      Peak Flow --      Pain Score 10/15/22 1517 9     Pain Loc --      Pain Edu? --      Excl. in GC? --    No data found.  Updated Vital Signs BP 120/79 (BP Location: Left Arm)   Pulse 74   Temp 98.3 F (36.8 C) (Oral)   Resp 18   SpO2 97%   Visual Acuity Right Eye Distance:   Left Eye Distance:   Bilateral Distance:    Right Eye Near:   Left Eye Near:    Bilateral Near:     Physical Exam Vitals and nursing note reviewed.  Constitutional:      Appearance: She is not ill-appearing or toxic-appearing.  HENT:     Head: Normocephalic and atraumatic.     Right Ear: Hearing and external ear normal.     Left Ear: Hearing and external ear normal.     Nose: Nose normal.     Mouth/Throat:     Lips: Pink.  Eyes:     General: Lids are normal. Vision grossly intact. Gaze aligned appropriately.  Extraocular Movements:  Extraocular movements intact.     Conjunctiva/sclera: Conjunctivae normal.  Pulmonary:     Effort: Pulmonary effort is normal.  Musculoskeletal:     Cervical back: Neck supple.     Right ankle: No swelling, deformity, ecchymosis or lacerations. No tenderness. Normal range of motion. Anterior drawer test negative. Normal pulse.     Right Achilles Tendon: Tenderness present. No defects. Thompson's test negative.     Left ankle: Normal.     Left Achilles Tendon: Normal.     Right foot: Normal range of motion and normal capillary refill. No swelling, deformity, bunion, Charcot foot, foot drop, prominent metatarsal heads, laceration, tenderness, bony tenderness or crepitus. Normal pulse.     Comments: Tenderness to the right achilles tendon worsened by extreme dorsiflexion and plantarflexion.   Skin:    General: Skin is warm and dry.     Capillary Refill: Capillary refill takes less than 2 seconds.     Findings: No rash.  Neurological:     General: No focal deficit present.     Mental Status: She is alert and oriented to person, place, and time. Mental status is at baseline.     Cranial Nerves: No dysarthria or facial asymmetry.  Psychiatric:        Mood and Affect: Mood normal.        Speech: Speech normal.        Behavior: Behavior normal.        Thought Content: Thought content normal.        Judgment: Judgment normal.      UC Treatments / Results  Labs (all labs ordered are listed, but only abnormal results are displayed) Labs Reviewed - No data to display  EKG   Radiology DG Foot Complete Right  Result Date: 10/15/2022 CLINICAL DATA:  Acute right heel pain after injury 2 days ago. EXAM: RIGHT FOOT COMPLETE - 3+ VIEW COMPARISON:  January 27, 2014. FINDINGS: There is no evidence of fracture or dislocation. There is no evidence of arthropathy or other focal bone abnormality. Soft tissues are unremarkable. IMPRESSION: Negative. Electronically Signed   By: Lupita Raider M.D.    On: 10/15/2022 15:52    Procedures Procedures (including critical care time)  Medications Ordered in UC Medications - No data to display  Initial Impression / Assessment and Plan / UC Course  I have reviewed the triage vital signs and the nursing notes.  Pertinent labs & imaging results that were available during my care of the patient were reviewed by me and considered in my medical decision making (see chart for details).  Achilles tendonitis of right lower extremity Evaluation suggests achilles tendonitis etiology. Right foot x-ray negative for signs of fracture/dislocation. Symptoms have not responded well to ibuprofen/tylenol use, therefore will initiate use of prednisone burst for 5 days with food. No NSAIDs when taking prednisone. Patient requesting cam walker boot, this is reasonable and will help with symptoms. Neurovascularly intact distally to injury/pain. Ice 20 minutes on 20 minutes off as needed. May follow-up with orthopedics as needed for any new or worsening symptoms.   Discussed red flag signs and symptoms of worsening condition,when to call the PCP office, return to urgent care, and when to seek higher level of care in the emergency department. Counseled patient regarding appropriate use of medications and potential side effects for all medications recommended or prescribed today. Patient verbalizes understanding and agreement with plan. Discharged in stable condition.     Final Clinical Impressions(s) /  UC Diagnoses   Final diagnoses:  Achilles tendinitis of right lower extremity     Discharge Instructions      Your symptoms are due to achilles tendonitis (inflammation of the tendon in your heel/the back of your foot).   Take 20 mg prednisone tonight, then 40mg  once daily for the next 5 days with food.  Do not take nay ibuprofen when taking prednisone.  Apply ice 20 minutes on 20 minutes off as needed for pain and inflammation.  Return as needed.     ED  Prescriptions     Medication Sig Dispense Auth. Provider   predniSONE (DELTASONE) 20 MG tablet Take 1 tablet (20 mg total) by mouth daily for 1 day, THEN 2 tablets (40 mg total) daily for 5 days. 11 tablet Carlisle Beers, FNP      PDMP not reviewed this encounter.   Carlisle Beers, FNP 10/15/22 1619    Carlisle Beers, FNP 01/06/23 1052

## 2022-10-15 NOTE — Discharge Instructions (Signed)
Your symptoms are due to achilles tendonitis (inflammation of the tendon in your heel/the back of your foot).   Take 20 mg prednisone tonight, then 40mg  once daily for the next 5 days with food.  Do not take nay ibuprofen when taking prednisone.  Apply ice 20 minutes on 20 minutes off as needed for pain and inflammation.  Return as needed.

## 2022-10-15 NOTE — ED Triage Notes (Signed)
Patient is having issues with right foot.   Right heel is tender, but nothing else related to foot.  This has been sore for 4 days.  2 days ago, she jumped off a 4 foot stack down to a concrete floor and pain is worse.  Has used ace wrap, ibuprofen, and ice, soaked with Epson salt

## 2022-10-19 ENCOUNTER — Ambulatory Visit: Payer: Worker's Compensation | Admitting: Student

## 2022-10-19 ENCOUNTER — Encounter: Payer: Self-pay | Admitting: Student

## 2022-10-19 VITALS — BP 118/64 | HR 99 | Temp 98.3°F | Resp 18 | Ht 67.0 in | Wt 166.4 lb

## 2022-10-19 DIAGNOSIS — S86011A Strain of right Achilles tendon, initial encounter: Secondary | ICD-10-CM | POA: Insufficient documentation

## 2022-10-19 MED ORDER — OXYCODONE-ACETAMINOPHEN 5-325 MG PO TABS: 1.0000 | ORAL_TABLET | Freq: Four times a day (QID) | ORAL | 0 refills | Status: DC | PRN

## 2022-10-19 NOTE — Progress Notes (Signed)
Acute Office Visit  Subjective:     Patient ID: Felicia Davila, female    DOB: 11-05-1990, 32 y.o.   MRN: [id number]  Chief Complaint  Patient presents with   Workers comp    Right foot injury/Achilles Tendonitis Occurred Jun 19th;went to [facility] & was referred to Malva Limes      HPI Felicia Davila is a 32 y.o. female present with workmen's comp injury. She is tearful at this time and is concerned about how she will continuing to work.  Her Injury occurred on 10/13/2022. She ended up going to UC on 10/15/2022 because the pain was so severe. The work up includes a diagnosis of Tendonitis of the right foot and placed in CAM walker boot. She was referred to [facility] where she saw them on 10/18/2022 with a diagnosis of torn achilles tendon. Treatment included a course of steroids (currently taking) and tylenol. Dr. Hyacinth Meeker her PCP suggested that she be seen [facility] for management of pain since she is already a patient there being treated for anxiety with xanax.    She endorses that the pain is an 8/10 with sitting and 10/10 with walking. She states "I notified my job [employer] to ask about the workman's comp and how I should proceed because I am still hurting."   History of lupus.     Review of Systems  Constitutional:  Negative for chills and fever.  Respiratory: Negative.    Cardiovascular: Negative.   Gastrointestinal:  Negative for nausea and vomiting.  Musculoskeletal:  Positive for joint pain.        Objective:    BP 118/64 (BP Location: Left Arm, Patient Position: Sitting, Cuff Size: Normal)   Pulse 99   Temp 98.3 F (36.8 C) (Temporal)   Resp 18   Ht 5\' 7"  (1.702 m)   Wt 166 lb 6.4 oz (75.5 kg)   SpO2 99%   BMI 26.06 kg/m    Physical Exam Cardiovascular:     Rate and Rhythm: Normal rate and regular rhythm.     Pulses:          Dorsalis pedis pulses are 1+ on the right side.     Heart sounds: Normal heart sounds. No murmur  heard. Pulmonary:     Effort: Pulmonary effort is normal.     Breath sounds: Normal breath sounds.  Abdominal:     General: Abdomen is flat.     Palpations: Abdomen is soft.  Musculoskeletal:     Right lower leg: No edema.     Left lower leg: No edema.     Right ankle: Tenderness present. Decreased range of motion.     Right Achilles Tendon: Tenderness present.     Right foot: Decreased range of motion. Tenderness present. No crepitus.  Feet:     Right foot:     Skin integrity: Skin integrity normal. No erythema or warmth.  Skin:    General: Skin is warm and dry.     No results found for any visits on 10/19/22.      Assessment & Plan:   Problem List Items Addressed This Visit     Achilles tendon rupture, right, initial encounter - Primary    A referral for MRI and pain management has been submitted.   The patient will finish course of steroids and will continue supportive measures.   I have reviewed the [place] Controlled Substance. The patient will take percocet 5-325 q 6 hours PRN. Marland KitchenShe was  instructed to not use xanax and narcotic together and space out at least 5 hours. She will not drink alcohol, operate heavy machinery and her children will be in the care of someone else while taking this medication. Patient verbalized understanding.       Relevant Medications   oxyCODONE-acetaminophen (PERCOCET) 5-325 MG tablet   Other Relevant Orders   Ambulatory referral to Orthopedic Surgery   Ambulatory referral to Pain Clinic    Meds ordered this encounter  Medications   oxyCODONE-acetaminophen (PERCOCET) 5-325 MG tablet    Sig: Take 1 tablet by mouth every 6 (six) hours as needed for severe pain.    Dispense:  20 tablet    Refill:  0    No follow-ups on file.  Edwena Blow, NP

## 2022-10-19 NOTE — Assessment & Plan Note (Signed)
A referral for MRI and pain management has been submitted.   The patient will finish course of steroids and will continue supportive measures.   I have reviewed the [place] Controlled Substance. The patient will take percocet 5-325 q 6 hours PRN. Marland KitchenShe was instructed to not use xanax and narcotic together and space out at least 5 hours. She will not drink alcohol, operate heavy machinery and her children will be in the care of someone else while taking this medication. Patient verbalized understanding.

## 2022-10-23 ENCOUNTER — Ambulatory Visit (HOSPITAL_COMMUNITY): Payer: Self-pay

## 2022-10-25 ENCOUNTER — Ambulatory Visit (HOSPITAL_COMMUNITY): Payer: Self-pay

## 2022-10-25 ENCOUNTER — Encounter (HOSPITAL_COMMUNITY): Payer: Self-pay

## 2022-10-25 ENCOUNTER — Ambulatory Visit (HOSPITAL_COMMUNITY)
Admission: RE | Admit: 2022-10-25 | Discharge: 2022-10-25 | Disposition: A | Discharge status: Home / Self Care | Payer: Medicaid Other | Source: Ambulatory Visit | Attending: Internal Medicine | Admitting: Internal Medicine

## 2022-10-25 VITALS — BP 124/84 | HR 87 | Temp 98.4°F | Resp 18

## 2022-10-25 DIAGNOSIS — S86001D Unspecified injury of right Achilles tendon, subsequent encounter: Secondary | ICD-10-CM

## 2022-10-25 NOTE — ED Triage Notes (Signed)
Also, patient is being followed by Ortho.

## 2022-10-25 NOTE — ED Triage Notes (Signed)
Pt is here for follow-up on her right foot pain. Pt has used ice and ibuprofen with no relief.

## 2022-10-25 NOTE — ED Provider Notes (Signed)
[facility]    CSN: [id number] Arrival date & time: 10/25/22  1057      History   Chief Complaint Chief Complaint  Patient presents with   Foot Injury    Entered by patient    HPI Felicia Davila is a 32 y.o. female comes to the urgent care requesting her medical records to be updated.  Patient was seen on 6/21 for Achilles tendon injury.  Patient is being managed by the orthopedic surgery team.  She is requesting these changes because of Worker's Compensation issues.   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   Murmur 09/17/2015    Past Surgical History:  Procedure Laterality Date   CESAREAN SECTION     CHOLECYSTECTOMY     DILATION AND EVACUATION N/A 11/06/2021   Procedure: DILATATION AND EVACUATION;  Surgeon: Catalina Antigua, MD;  Location: [facility];  Service: Gynecology;  Laterality: N/A;   FOREARM SURGERY Left    HYSTEROSCOPY WITH D & C  12/14/2021   Procedure: DILATATION AND CURETTAGE /HYSTEROSCOPY;  Surgeon: Warden Fillers, MD;  Location: [facility];  Service: Gynecology;;    OB History     Gravida  4   Para  2   Term  1   Preterm  1   AB  2   Living  2      SAB  1   IAB      Ectopic  1   Multiple      Live Births  2            Home Medications    Prior to Admission medications   Medication Sig Start Date End Date Taking? Authorizing Provider  ALPRAZolam Prudy Feeler) 0.5 MG tablet Take 0.5 mg by mouth 2 (two) times daily as needed. 06/03/22  Yes  [provider]  Drospirenone (SLYND) 4 MG TABS Take 1 tablet (4 mg total) by mouth daily. 07/08/22  Yes Warden Fillers, MD  Fluoxetine HCl, PMDD, 20 MG TABS Take by mouth. 07/30/20  Yes [provider]  oxyCODONE-acetaminophen (PERCOCET) 5-325 MG tablet Take 1 tablet by mouth every 6 (six) hours as needed for severe pain. 10/19/22  Yes Leak, Brandi L, NP  cetirizine (ZYRTEC ALLERGY) 10 MG tablet Take 1 tablet (10 mg total) by mouth daily. 03/30/22   Wallis Bamberg, PA-C  escitalopram (LEXAPRO) 10 MG tablet Take 10 mg by mouth daily. Patient not taking: Reported on 10/15/2022 06/03/22   [provider]  fluticasone (FLONASE) 50 MCG/ACT nasal spray Place 1 spray into both nostrils daily. 06/09/22   Gustavus Bryant, FNP  norethindrone (MICRONOR) 0.35 MG tablet Take 1 tablet (0.35 mg total) by mouth daily. Patient not taking: Reported on 10/15/2022 03/30/22   Warden Fillers, MD  ondansetron (ZOFRAN) 4 MG tablet Take 1 tablet (4 mg total) by mouth every 8 (eight) hours as needed for nausea or vomiting. 07/13/22   Ellsworth Lennox, PA-C  phenol (CHLORASEPTIC) 1.4 % LIQD Use as directed  1 spray in the mouth or throat as needed for throat irritation / pain. 06/09/22   Gustavus Bryant, FNP  promethazine-dextromethorphan (PROMETHAZINE-DM) 6.25-15 MG/5ML syrup Take 2.5 mLs by mouth 3 (three) times daily as needed for cough. Patient not taking: Reported on 10/15/2022 03/30/22   Wallis Bamberg, PA-C  solifenacin (VESICARE) 5 MG tablet Take 1 tablet (5 mg total) by mouth daily. 02/24/22   Marguerita Beards, MD    Family History Family History  Problem Relation Age of Onset   Healthy Mother    Lupus Father     Social History Social History   Tobacco Use   Smoking status: Former    Types: Cigarettes   Smokeless tobacco: Never  Vaping Use   Vaping Use: Every day   Substances: Nicotine  Substance Use Topics   Alcohol use: No   Drug use: No     Allergies   Morphine and codeine,  Nitrofurantoin, Fentanyl, Hydrocodone, Meloxicam, Naproxen, Toradol [ketorolac tromethamine], and Chlorhexidine   Review of Systems Review of Systems As per HPI  Physical Exam Triage Vital Signs ED Triage Vitals  Enc Vitals Group     BP 10/25/22 1137 124/84     Pulse Rate 10/25/22 1137 87     Resp 10/25/22 1137 18     Temp 10/25/22 1137 98.4 F (36.9 C)     Temp Source 10/25/22 1137 Oral     SpO2 10/25/22 1138 98 %     Weight --      Height --      Head Circumference --      Peak Flow --      Pain Score --      Pain Loc --      Pain Edu? --      Excl. in GC? --    No data found.  Updated Vital Signs BP 124/84 (BP Location: Left Arm)   Pulse 87   Temp 98.4 F (36.9 C) (Oral)   Resp 18   SpO2 98%   Visual Acuity Right Eye Distance:   Left Eye Distance:   Bilateral Distance:    Right Eye Near:   Left Eye Near:    Bilateral Near:     Physical Exam Vitals and nursing note reviewed.  Musculoskeletal:     Comments: Patient is in the cam boot.  Right ankle exam was not done.  Neurological:     General: No focal deficit present.     Mental Status: She is alert and oriented to person, place, and time.  Psychiatric:        Mood and Affect: Mood normal.        Behavior: Behavior normal.      UC Treatments / Results  Labs (all labs ordered are listed, but only abnormal results are displayed) Labs Reviewed - No data to display  EKG   Radiology No results found.  Procedures Procedures (including critical care time)  Medications Ordered in UC Medications - No data to display  Initial Impression / Assessment and Plan / UC Course  I have reviewed the triage vital signs and the nursing notes.  Pertinent labs & imaging results that were available during my care of the patient were reviewed by me and considered in my medical decision making (see chart for details).     1.  Right Achilles tendon injury: Patient is advised to follow-up with the health  information department to start the process of having her medical records amended. Final  Clinical Impressions(s) / UC Diagnoses   Final diagnoses:  Achilles tendon injury, right, subsequent encounter     Discharge Instructions      Please reach out to medical records department and make a formal request for change of medical records.  Once the provider gets the medical records she will update the records to reflect the additional details that you are providing.   ED Prescriptions   None    PDMP not reviewed this encounter.   Merrilee Jansky, MD 10/25/22 1224

## 2022-10-25 NOTE — Discharge Instructions (Signed)
Please reach out to medical records department and make a formal request for change of medical records.  Once the provider gets the medical records she will update the records to reflect the additional details that you are providing.

## 2022-12-28 ENCOUNTER — Ambulatory Visit: Payer: Medicaid Other | Admitting: Physical Therapy

## 2022-12-28 NOTE — Therapy (Deleted)
OUTPATIENT PHYSICAL THERAPY LOWER EXTREMITY EVALUATION   Patient Name: Felicia Davila MRN: [id number] DOB:July 17, 1990, 32 y.o., female Today's Date: 12/28/2022  END OF SESSION:   Past Medical History:  Diagnosis Date   Anxiety    Depression    Lupus (HCC)    PONV (postoperative nausea and vomiting)    Past Surgical History:  Procedure Laterality Date   CESAREAN SECTION     CHOLECYSTECTOMY     DILATION AND EVACUATION N/A 11/06/2021   Procedure: DILATATION AND EVACUATION;  Surgeon: Catalina Antigua, MD;  Location: [facility] OR;  Service: Gynecology;  Laterality: N/A;   FOREARM SURGERY Left    HYSTEROSCOPY WITH D & C  12/14/2021   Procedure: DILATATION AND CURETTAGE /HYSTEROSCOPY;  Surgeon: Warden Fillers, MD;  Location: [facility] OR;  Service: Gynecology;;   Patient Active Problem List   Diagnosis Date Noted   Achilles tendon rupture, right, initial encounter 10/19/2022   Encounter for postoperative care 01/11/2022   Incomplete emptying of bladder 01/11/2022   Abnormal uterine bleeding (AUB) 12/14/2021   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 11/02/2021   Prior methotrexate therapy 07/28/2021   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  Rationale for Evaluation and Treatment: Rehabilitation  ONSET DATE: Fx on 10-13-22  SUBJECTIVE:   SUBJECTIVE STATEMENT: ***  PERTINENT HISTORY: *** PAIN:  Are you having pain? Yes: NPRS scale: ***/10 Pain location: *** Pain description: *** Aggravating factors: *** Relieving factors: ***  PRECAUTIONS: {Therapy precautions:24002}  RED FLAGS: {PT Red Flags:29287}   WEIGHT BEARING RESTRICTIONS: {Yes ***/No:24003}  FALLS:  Has patient fallen in last 6 months? {fallsyesno:27318}  LIVING ENVIRONMENT: Lives with: {OPRC  lives with:25569::"lives with their family"} Lives in: {Lives in:25570} Stairs: {opstairs:27293} Has following equipment at home: {Assistive devices:23999}  OCCUPATION: ***  PLOF: {PLOF:24004}  PATIENT GOALS: ***  NEXT MD VISIT: ***  OBJECTIVE:   DIAGNOSTIC FINDINGS: ***  PATIENT SURVEYS:  {rehab surveys:24030}  COGNITION: Overall cognitive status: {cognition:24006}     SENSATION: {sensation:27233}  EDEMA:  {edema:24020}  MUSCLE LENGTH: Hamstrings: Right *** deg; Left *** deg Thomas test: Right *** deg; Left *** deg  POSTURE: {posture:25561}  PALPATION: ***  LOWER EXTREMITY ROM:  {AROM/PROM:27142} ROM Right eval Left eval  Hip flexion    Hip extension    Hip abduction    Hip adduction    Hip internal rotation    Hip external rotation    Knee flexion    Knee extension    Ankle dorsiflexion    Ankle plantarflexion    Ankle inversion    Ankle eversion     (Blank rows = not tested)  LOWER EXTREMITY MMT:  MMT Right eval Left eval  Hip flexion    Hip extension    Hip abduction    Hip adduction    Hip internal rotation    Hip external rotation    Knee flexion    Knee extension    Ankle dorsiflexion    Ankle plantarflexion    Ankle inversion    Ankle eversion     (Blank rows = not tested)  LOWER EXTREMITY SPECIAL TESTS:  {LEspecialtests:26242}  FUNCTIONAL TESTS:  {Functional tests:24029}  GAIT: Distance walked: *** Assistive device utilized: {Assistive devices:23999} Level of assistance: {Levels of assistance:24026} Comments: ***   TODAY'S TREATMENT:  DATE: ***    PATIENT EDUCATION:  Education details: POC Explanation of findings, initial HEP, FOTO Person educated: Patient Education method: Explanation, Demonstration, Tactile cues, Verbal cues, and Handouts Education comprehension: verbalized understanding,  returned demonstration, verbal cues required, tactile cues required, and needs further education  HOME EXERCISE PROGRAM: ***  ASSESSMENT:  CLINICAL IMPRESSION: Patient is a 32 y.o. female who was seen today for physical therapy evaluation and treatment for right non-displaced calcaneus fracture suffered 10/13/2022 .   OBJECTIVE IMPAIRMENTS: decreased activity tolerance, decreased balance, decreased knowledge of use of DME, decreased mobility, difficulty walking, decreased ROM, decreased strength, increased edema, and pain.   ACTIVITY LIMITATIONS: {activitylimitations:27494}  PARTICIPATION LIMITATIONS: {participationrestrictions:25113}  PERSONAL FACTORS: {Personal factors:25162} are also affecting patient's functional outcome.   REHAB POTENTIAL: {rehabpotential:25112}  CLINICAL DECISION MAKING: {clinical decision making:25114}  EVALUATION COMPLEXITY: {Evaluation complexity:25115}   GOALS: Goals reviewed with patient? {yes/no:20286}  SHORT TERM GOALS: Target date: *** Pt will be independent with initial HEP Baseline: Goal status: INITIAL  2.  *** Baseline:  Goal status: INITIAL  3.  Pt will be able to tolerate tennis shoes for 1 hour to improve gait ability and safety Baseline:  Goal status: INITIAL  4.   Baseline:  Goal status: INITIAL  5.   Baseline:  Goal status: INITIAL  6.   Baseline:  Goal status: INITIAL  LONG TERM GOALS: Target date: ***  *** Baseline:  Goal status: INITIAL  2.  Pt will be independent with advanced HEP including lifting heavier weight  with out increasing pain in medial left arch Baseline:  Goal status: INITIAL  3.  Pt will be able to SLS on left to 35 x without pain greater than 1/10 Baseline:  Goal status: INITIAL  4.  FOTO will improve from    to     indicating improved functional mobility  Baseline:  Goal status: INITIAL  5.  *** Baseline:  Goal status: INITIAL  6.  *** Baseline:  Goal status:  INITIAL   PLAN:  PT FREQUENCY: 2x/week  PT DURATION: 8 weeks  PLANNED INTERVENTIONS: Therapeutic exercises, Therapeutic activity, Neuromuscular re-education, Balance training, Gait training, Patient/Family education, Self Care, Joint mobilization, Stair training, DME instructions, Aquatic Therapy, Dry Needling, Electrical stimulation, Cryotherapy, Moist heat, Manual therapy, and Re-evaluation  PLAN FOR NEXT SESSION: Gait training, manual,progressive exercise, PROM/AROM  ***    Check all possible CPT codes: 54098 - PT Re-evaluation, 97110- Therapeutic Exercise, [phone]- Neuro Re-education, [phone] - Gait Training, [phone] - Manual Therapy, [phone] - Therapeutic Activities, [phone] - Self Care, [phone] - Electrical stimulation (Manual), [id number] - Physical performance training, and [id number] - Aquatic therapy    Check all conditions that are expected to impact treatment: {Conditions expected to impact treatment:{Conditions expected to impact treatment:28273}   If treatment provided at initial evaluation, no treatment charged due to lack of authorization.

## 2022-12-30 NOTE — Therapy (Signed)
OUTPATIENT PHYSICAL THERAPY LOWER EXTREMITY EVALUATION   Patient Name: Felicia Davila MRN: [id number] DOB:1990-08-12, 32 y.o., female Today's Date: 12/31/2022  END OF SESSION:  PT End of Session - 12/31/22 1311     Visit Number 1    Number of Visits 17    Date for PT Re-Evaluation 02/25/23    Authorization Type medicaid healthy blue    Authorization Time Period auth tbd    PT Start Time 1312   late check in   PT Stop Time 1405    PT Time Calculation (min) 53 min    Activity Tolerance Patient limited by pain;No increased pain    Behavior During Therapy WFL for tasks assessed/performed             Past Medical History:  Diagnosis Date   Anxiety    Depression    Lupus (HCC)    PONV (postoperative nausea and vomiting)    Past Surgical History:  Procedure Laterality Date   CESAREAN SECTION     CHOLECYSTECTOMY     DILATION AND EVACUATION N/A 11/06/2021   Procedure: DILATATION AND EVACUATION;  Surgeon: Catalina Antigua, MD;  Location: [facility] OR;  Service: Gynecology;  Laterality: N/A;   FOREARM SURGERY Left    HYSTEROSCOPY WITH D & C  12/14/2021   Procedure: DILATATION AND CURETTAGE /HYSTEROSCOPY;  Surgeon: Warden Fillers, MD;  Location: [facility] OR;  Service: Gynecology;;   Patient Active Problem List   Diagnosis Date Noted   Achilles tendon rupture, right, initial encounter 10/19/2022   Encounter for postoperative care 01/11/2022   Incomplete emptying of bladder 01/11/2022   Abnormal uterine bleeding (AUB) 12/14/2021   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 11/02/2021   Prior methotrexate therapy 07/28/2021   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 States she had immediate pain, notified her job but continued working. By the end of the day she states her pain had worsened significantly. States she went to urgent care next day and did an XR which was reportedly unremarkable, was told it was an achilles tendon issue. States she was referred to ortho who initially thought it was achilles issue until MRI (about a month ago per pt report, unable to view in EPIC) which ultimately revealed fracture. States she has been in CAM boot since urgent care visit. 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 Pt states she is out of work til at least 01/10/23. States pain is about the same since onset, does fluctuate some based on activity. About a month ago she states she tripped up stairs and seemed like it exacerbated symptoms. No numbness/tingling, had some swelling initially after injury but notes this has improved.   PERTINENT HISTORY: Anxiety/depression, lupus  PAIN:  Are you having pain: 7/10 Location/description: heel,  up into achilles/calf with WB.  Best-worst over past week: 6-9/10  - aggravating factors: walking, trying to put weight on foot, heat - Easing factors: rest/NWB  PRECAUTIONS: fall risk, calcaneal fracture   WEIGHT BEARING RESTRICTIONS: WBAT per paper referral (see media tab in EPIC)  FALLS:  Has patient fallen in last 6 months? 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 States she had to do a fair bit of running, jumping, and climbing.   PLOF: Independent - enjoyed running, being active  PATIENT GOALS: get back to being active - enjoys hiking, running   NEXT MD VISIT: 01/10/23  OBJECTIVE:   DIAGNOSTIC FINDINGS:  Unremarkable R foot XR on 10/15/22 per review of EPIC  Per MD paper referral, dx of non-displaced R calcaneal fracture which pt states was seen on MRI  PATIENT SURVEYS:  FOTO 15 current, 52 predicted  COGNITION: Overall cognitive status: Within functional limits for tasks assessed     SENSATION: Light touch intact BIL LE although it is diminished medially on RLE from mid calf to plantar aspect of foot, and hypersensitivity is present along dorsal aspect of R foot, lateral ankle/heel, and laterally to about the distal third of calf  EDEMA:  Not formally measured although there is no significant edema noted upon observation - pt notes swelling much improved compared to onset  PALPATION: Noted tenderness distal gastroc/soleus and throughout achilles. Limited palpation exam for foot/ankle given hypersensitivity to light touch (see above)  LOWER EXTREMITY ROM:  Active ROM Right eval Left eval  Knee flexion    Knee extension    Ankle dorsiflexion Lacking 18 deg, painful and stiff 10 deg  Ankle plantarflexion 50 deg * WNL  Ankle inversion 8 deg * 25 deg  Ankle eversion 5 deg * 10 deg   (Blank rows = not tested) (Key: WFL = within functional limits not formally assessed, * = concordant pain, s = stiffness/stretching sensation, NT = not tested)  Comments:  of note, R foot tends to rest in about 30-40 deg of plantarflexion while sitting  LOWER EXTREMITY MMT:  MMT Right eval Left eval  Knee flexion    Knee extension    Ankle dorsiflexion    Ankle plantarflexion    Ankle inversion    Ankle eversion     (Blank rows = not tested) (Key: WFL = within functional  limits not formally assessed, * = concordant pain, s = stiffness/stretching sensation, NT = not tested)  Comments: deferred given symptom irritability   FUNCTIONAL TESTS:  TUG: 23 sec w/ axillary crutches and CAM boot 5xSTS: 18.92sec with UE support, essentially NWB RLE    GAIT: Distance walked: within clinic Assistive device utilized: Crutches and CAM boot RLE  Level of assistance: Modified independence Comments: partial hop through pattern leading with LLE, keeps RLE in relatively flexed position at hip/knee. Occasionally treats as TTWB in CAM boot but mostly NWB   TODAY'S TREATMENT:  [facility] Adult PT Treatment:                                                DATE: 12/31/22 Therapeutic Exercise: Demonstrative rep with ROM measurements for DF/PF and eversion/inversion in NWB position, education on HEP, emphasis on comfortable ROM and repetition Handout provided + education on appropriate performance    PATIENT EDUCATION:  Education details: Pt education on PT impairments, prognosis, and POC. Informed consent. Rationale for interventions, safe/appropriate HEP performance Person educated: Patient Education method: Explanation, Demonstration, Tactile cues, Verbal cues, and Handouts Education comprehension: verbalized understanding, returned demonstration, verbal cues required, tactile cues required, and needs further education    HOME EXERCISE PROGRAM: Access Code: [email or web address] URL: [email or web address] Date: 12/31/2022 Prepared by: Fransisco Hertz  Program Notes please keep movement within relatively comfortable range of motion  Exercises - Seated Ankle Pumps on Table  - 2-3 x daily - 7 x weekly - 1 sets - 10 reps - Supine Ankle Inversion Eversion AROM  - 2-3 x daily - 7 x weekly - 1 sets - 10 reps  ASSESSMENT:  CLINICAL IMPRESSION: Patient is a  pleasant 32 y.o. woman who was seen today for physical therapy evaluation and treatment for ankle/foot pain in context of calcaneal fracture sustained on 10/13/22 per referral, WBAT. Pt endorses significant mobility/activity limitations due to pain. She has been ambulating using CAM boot and axillary crutches - states at most recent physician visit she was encouraged to work with PT and wean out of boot, increase WB, although she notes that she has remained essentially NWB due to pain. On exam symptoms appear fairly irritable, with pt endorsing pain/stiffness and demonstrating limitations with active ankle mobility in all planes. With sensation exam, she endorses hypersensitivity to light touch about her lateral ankle/foot and reduced light touch medially compared to contralateral limb. TUG and 5xSTS times are both indicative of reduced mobility and fall risk, and during all functional mobility within clinic today pt essentially maintains NWB with infrequent TTWB using CAM boot given reported pain with WB. No adverse events, pt endorses no change in symptoms on departure. HEP initiated today with education as above. Recommend trial of skilled PT to address aforementioned deficits with aim of improving functional tolerance and reducing pain with typical activities; based on today's exam recommend initial focus on improving mobility within tolerance, desensitization techniques, and gradual introduction of WB as able/tolerated per physician referral. Pt departs today's session in no acute distress, all voiced concerns/questions addressed appropriately from PT perspective.    OBJECTIVE IMPAIRMENTS: Abnormal gait, decreased activity tolerance, decreased balance, decreased endurance, decreased mobility, difficulty walking, decreased ROM, decreased strength, hypomobility, impaired perceived functional ability, impaired sensation, improper body mechanics, and pain.   ACTIVITY LIMITATIONS: carrying, lifting, standing,  squatting, stairs, transfers, locomotion level, and caring for others  PARTICIPATION LIMITATIONS: meal prep, cleaning, laundry, driving, shopping, community activity, and occupation  PERSONAL FACTORS: Time since onset of injury/illness/exacerbation and 1-2 comorbidities: anxiety/depression, lupus  are also affecting patient's functional outcome.   REHAB POTENTIAL: Fair given time since injury, symptom severity  CLINICAL DECISION MAKING: Stable/uncomplicated  EVALUATION COMPLEXITY: Low   GOALS: Goals reviewed with patient? No - on eval did discuss role of PT, PT exam findings and POC  SHORT TERM GOALS: Target date: 01/28/2023 Pt will demonstrate appropriate understanding and performance of initially prescribed HEP in order to  facilitate improved independence with management of symptoms.  Baseline: HEP provided on eval Goal status: INITIAL   2. Pt will score greater than or equal to 34 on FOTO in order to demonstrate improved perception of function due to symptoms.  Baseline: 15  Goal status: INITIAL   3. Pt will report at least 25% decrease in overall pain levels in past week in order to facilitate improved tolerance to basic ADLs/mobility.   Baseline: 6-9/10  Goal status: INITIAL    LONG TERM GOALS: Target date: 02/25/2023 Pt will score 52 or greater on FOTO in order to demonstrate improved perception of function due to symptoms.  Baseline: 15 Goal status: INITIAL  2.  Pt will be able to achieve at least neutral for active ankle dorsiflexion on RLE in order to facilitate improved mechanics for WB activities.  Baseline: see ROM chart above Goal status: INITIAL  3.  Pt will demonstrate appropriate performance of final prescribed HEP in order to facilitate improved self-management of symptoms post-discharge.   Baseline: initial HEP prescribed  Goal status: INITIAL    4.  Pt will be able to perform 5xSTS in less than or equal to 13sec in order to demonstrate reduced fall risk and  improved functional independence (MCID of 2.3sec, age cohort norm 6.2+/-1.3sec per Billie Ruddy et al 2007)  Baseline: 18sec with CAM boot and UE support (essentially NWB) Goal status: INITIAL   5. Pt will be able to perform TUG in less than or equal to 13 sec with LRAD in order to indicate reduced risk of falling (cutoff score for fall risk 13.5 sec in community dwelling older adults per [facility] et al, 2000)  Baseline: 23 sec with axillary crutches and CAM boot   Goal status: INITIAL    6. Pt will report at least 50% decrease in overall pain levels in past week in order to facilitate improved tolerance to basic ADLs/mobility.   Baseline: 6-9/10  Goal status: INITIAL    7. Pt will be able to tolerate donning/doffing typical footwear to RLE in order to promote return to PLOF.  Baseline: utilizing CAM boot, unable to tolerate shoes due to hypersensitivity to touch  Goal status: INITIAL    PLAN:  PT FREQUENCY: 2x/week  PT DURATION: 8 weeks  PLANNED INTERVENTIONS: Therapeutic exercises, Therapeutic activity, Neuromuscular re-education, Balance training, Gait training, Patient/Family education, Self Care, Joint mobilization, Stair training, Aquatic Therapy, Cryotherapy, Moist heat, Taping, Manual therapy, and Re-evaluation  PLAN FOR NEXT SESSION: Review/update HEP PRN. Anticipate gentle mobility, desensitization techniques for hypersensitivity along lateral ankle, and gradual progression with WB activity. Symptom modification strategies as indicated/appropriate, pt reports heat is provocative.    Ashley Murrain PT, DPT 12/31/2022 3:13 PM   Medicaid authorization:  Check all possible CPT codes: 62130 - PT Re-evaluation, 97110- Therapeutic Exercise, [phone]- Neuro Re-education, [phone] - Gait Training, [phone] - Manual Therapy, [phone] - Therapeutic Activities, [phone] - Self Care, [phone] - Physical performance training, and [phone] - Aquatic therapy    Check all conditions that are expected to impact  treatment: Musculoskeletal disorders   If treatment provided at initial evaluation, no treatment charged due to lack of authorization.

## 2022-12-31 ENCOUNTER — Other Ambulatory Visit: Payer: Self-pay

## 2022-12-31 ENCOUNTER — Encounter: Payer: Medicaid Other | Admitting: Physical Therapy

## 2022-12-31 ENCOUNTER — Ambulatory Visit: Payer: Medicaid Other | Attending: Orthopedic Surgery | Admitting: Physical Therapy

## 2022-12-31 ENCOUNTER — Encounter: Payer: Self-pay | Admitting: Physical Therapy

## 2022-12-31 DIAGNOSIS — R2689 Other abnormalities of gait and mobility: Secondary | ICD-10-CM | POA: Diagnosis present

## 2022-12-31 DIAGNOSIS — M6281 Muscle weakness (generalized): Secondary | ICD-10-CM | POA: Diagnosis present

## 2022-12-31 DIAGNOSIS — M25571 Pain in right ankle and joints of right foot: Secondary | ICD-10-CM | POA: Diagnosis present

## 2023-01-03 ENCOUNTER — Ambulatory Visit: Payer: Medicaid Other | Admitting: Physical Therapy

## 2023-01-03 DIAGNOSIS — M25571 Pain in right ankle and joints of right foot: Secondary | ICD-10-CM | POA: Diagnosis not present

## 2023-01-03 DIAGNOSIS — M6281 Muscle weakness (generalized): Secondary | ICD-10-CM

## 2023-01-03 DIAGNOSIS — R2689 Other abnormalities of gait and mobility: Secondary | ICD-10-CM

## 2023-01-03 NOTE — Therapy (Addendum)
OUTPATIENT PHYSICAL THERAPY LOWER EXTREMITY + NO VISIT DISCHARGE SUMMARY (see below)    Patient Name: Felicia Davila MRN: [id number] DOB:05-Oct-1990, 32 y.o., female Today's Date: 01/03/2023  END OF SESSION:  PT End of Session - 01/03/23 1235     Visit Number 2    Number of Visits 17    Date for PT Re-Evaluation 02/25/23    Authorization Type medicaid healthy blue    Authorization Time Period auth tbd    PT Start Time 1146    PT Stop Time 1230    PT Time Calculation (min) 44 min    Activity Tolerance Patient limited by pain;Patient tolerated treatment well    Behavior During Therapy WFL for tasks assessed/performed              Past Medical History:  Diagnosis Date   Anxiety    Depression    Lupus (HCC)    PONV (postoperative nausea and vomiting)    Past Surgical History:  Procedure Laterality Date   CESAREAN SECTION     CHOLECYSTECTOMY     DILATION AND EVACUATION N/A 11/06/2021   Procedure: DILATATION AND EVACUATION;  Surgeon: Catalina Antigua, MD;  Location: [facility] OR;  Service: Gynecology;  Laterality: N/A;   FOREARM SURGERY Left    HYSTEROSCOPY WITH D & C  12/14/2021   Procedure: DILATATION AND CURETTAGE /HYSTEROSCOPY;  Surgeon: Warden Fillers, MD;  Location: [facility] OR;  Service: Gynecology;;   Patient Active Problem List   Diagnosis Date Noted   Achilles tendon rupture, right, initial encounter 10/19/2022   Encounter for postoperative care 01/11/2022   Incomplete emptying of bladder 01/11/2022   Abnormal uterine bleeding (AUB) 12/14/2021   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  Pt with 7/10 pain today.  Pt states while at work she was in a small space and had to jump out, describes about a 5 ft drop. States she had immediate pain, notified her job but continued working. By the end of the day she states her pain had worsened significantly. States she went to urgent care next day and did an XR which was reportedly unremarkable, was told it was an achilles tendon issue. States she was referred to ortho who initially thought it was achilles issue until MRI (about a month ago per pt report, unable to view in EPIC) which ultimately revealed fracture. States she has been in CAM boot since urgent care visit. 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 Pt states she is out of work til at least 01/10/23. States pain is about the same since onset, does fluctuate some based on activity. About a month ago she states she tripped up stairs and seemed like it exacerbated symptoms. No numbness/tingling, had some swelling initially after  injury but notes this has improved.   PERTINENT HISTORY: Anxiety/depression, lupus  PAIN:  Are you having pain: 7/10 Location/description: heel, up into achilles/calf with WB.  Best-worst over past week: 6-9/10  - aggravating factors: walking, trying to put weight on foot, heat - Easing factors: rest/NWB  PRECAUTIONS: fall risk, calcaneal fracture   WEIGHT BEARING RESTRICTIONS: WBAT per paper referral (see media tab in EPIC)  FALLS:  Has patient fallen in last 6 months? 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 States she had to do a fair bit of running, jumping, and climbing.   PLOF: Independent - enjoyed running, being active  PATIENT GOALS: get back to being active - enjoys hiking, running   NEXT MD VISIT: 01/10/23  OBJECTIVE:   DIAGNOSTIC FINDINGS:  Unremarkable R foot XR on 10/15/22 per review of EPIC  Per MD paper referral, dx of non-displaced R calcaneal fracture which pt states was seen on MRI  PATIENT SURVEYS:  FOTO 15 current, 52 predicted  COGNITION: Overall cognitive status: Within functional limits for tasks assessed     SENSATION: Light touch intact BIL LE although it is diminished medially on RLE from mid calf to plantar aspect of foot, and hypersensitivity is present along dorsal aspect of R foot, lateral ankle/heel, and laterally to about the distal third of calf  EDEMA:  Not formally measured although there is no significant edema noted upon observation - pt notes swelling much improved compared to onset  PALPATION: Noted tenderness distal gastroc/soleus and throughout achilles. Limited palpation exam for foot/ankle given hypersensitivity to light touch (see above)  LOWER EXTREMITY ROM:  Active ROM Right eval Left eval  Knee flexion    Knee extension    Ankle dorsiflexion Lacking 18 deg, painful and stiff 10 deg  Ankle plantarflexion 50 deg * WNL  Ankle inversion 8 deg * 25 deg  Ankle eversion 5 deg * 10 deg   (Blank rows = not tested) (Key: WFL = within functional limits not formally assessed, * = concordant pain, s = stiffness/stretching sensation, NT = not tested)  Comments:  of note, R foot tends to rest in about 30-40 deg of plantarflexion while sitting  LOWER EXTREMITY MMT:  MMT Right eval Left eval  Knee flexion    Knee extension    Ankle dorsiflexion    Ankle plantarflexion    Ankle  inversion    Ankle eversion     (Blank rows = not tested) (Key: WFL = within functional limits not formally assessed, * = concordant pain, s = stiffness/stretching sensation, NT = not tested)  Comments: deferred given symptom irritability   FUNCTIONAL TESTS:  TUG: 23 sec w/ axillary crutches and CAM boot 5xSTS: 18.92sec with UE support, essentially NWB RLE    GAIT: Distance walked: within clinic Assistive device utilized: Crutches and CAM boot RLE  Level of assistance: Modified independence Comments: partial hop through pattern leading with LLE, keeps RLE in relatively flexed position at hip/knee. Occasionally treats as TTWB in CAM boot but mostly NWB   TODAY'S TREATMENT:    [facility] Adult PT Treatment:  DATE: 01-03-23 Therapeutic Exercise: Ankle pumps Isometric  R DF with ball 2 x 10 Isometric  R IN with ball 1 x 10 Isometirc R EV with ball 2 x 10 Isometric R PF with ball  2 x 10 PT AAROM of all planes of ROM Seated heel raise and toe raise Seated R knee flexion with wt bear into R heel  Gait:: Weight shifting in llbars   forward /backward and side to side  Ambulating back and forth in llbars 5 lengths x 15 ft                                                                                                                           [facility] Adult PT Treatment:                                                DATE: 12/31/22 Therapeutic Exercise: Demonstrative rep with ROM measurements for DF/PF and eversion/inversion in NWB position, education on HEP, emphasis on comfortable ROM and repetition Handout provided + education on appropriate performance    PATIENT EDUCATION:  Education details: Pt education on PT impairments, prognosis, and POC. Informed consent. Rationale for interventions, safe/appropriate HEP performance Person educated: Patient Education method: Explanation, Demonstration, Tactile cues, Verbal cues, and Handouts Education  comprehension: verbalized understanding, returned demonstration, verbal cues required, tactile cues required, and needs further education    HOME EXERCISE PROGRAM: Access Code: [email or web address] URL: [email or web address] Date: 12/31/2022 Prepared by: Fransisco Hertz  Program Notes please keep movement within relatively comfortable range of motion  Exercises - Seated Ankle Pumps on Table  - 2-3 x daily - 7 x weekly - 1 sets - 10 reps - Supine Ankle Inversion Eversion AROM  - 2-3 x daily - 7 x weekly - 1 sets - 10 reps Added 01-03-23 Program Notes please keep movement within relatively comfortable range of motionPlease prop you R ankle on a towel roll so it will not bear weight on your tender calcaneus  - Isometric Ankle Dorsiflexion and Plantarflexion  - 1 x daily - 7 x weekly - 3 sets - 5 -10 reps - Long Sitting Isometric Ankle Eversion in Dorsiflexion with Ball at Wall  - 1 x daily - 7 x weekly - 3 sets - 10 reps - Long Sitting Isometric Ankle Eversion in Plantar Flexion with Ball at Wall  - 1 x daily - 7 x weekly - 3 sets - 10 reps - Long Sitting Isometric Ankle Plantarflexion with Ball at [employer]  - 1 x daily - 7 x weekly - 3 sets - 10 reps ASSESSMENT:  CLINICAL IMPRESSION:  Milicent enters clinic with crutches and Cam walking boot.  Discussed with pt need to wean from boot and get a comfortable shoe to transition out of boot that she has been in for over 3 months.  Pt  understood and will try to get for next visit. Pt session concentrated on maximizing range of motion and given isometric HEP update.   Gait in llbars to encourage endurance  and time standing.   EVAL- Patient is a pleasant 32 y.o. woman who was seen today for physical therapy evaluation and treatment for ankle/foot pain in context of calcaneal fracture sustained on 10/13/22 per referral, WBAT. Pt endorses significant mobility/activity limitations due to pain. She has been ambulating using CAM boot and axillary crutches -  states at most recent physician visit she was encouraged to work with PT and wean out of boot, increase WB, although she notes that she has remained essentially NWB due to pain. On exam symptoms appear fairly irritable, with pt endorsing pain/stiffness and demonstrating limitations with active ankle mobility in all planes. With sensation exam, she endorses hypersensitivity to light touch about her lateral ankle/foot and reduced light touch medially compared to contralateral limb. TUG and 5xSTS times are both indicative of reduced mobility and fall risk, and during all functional mobility within clinic today pt essentially maintains NWB with infrequent TTWB using CAM boot given reported pain with WB. No adverse events, pt endorses no change in symptoms on departure. HEP initiated today with education as above. Recommend trial of skilled PT to address aforementioned deficits with aim of improving functional tolerance and reducing pain with typical activities; based on today's exam recommend initial focus on improving mobility within tolerance, desensitization techniques, and gradual introduction of WB as able/tolerated per physician referral. Pt departs today's session in no acute distress, all voiced concerns/questions addressed appropriately from PT perspective.    OBJECTIVE IMPAIRMENTS: Abnormal gait, decreased activity tolerance, decreased balance, decreased endurance, decreased mobility, difficulty walking, decreased ROM, decreased strength, hypomobility, impaired perceived functional ability, impaired sensation, improper body mechanics, and pain.   ACTIVITY LIMITATIONS: carrying, lifting, standing, squatting, stairs, transfers, locomotion level, and caring for others  PARTICIPATION LIMITATIONS: meal prep, cleaning, laundry, driving, shopping, community activity, and occupation  PERSONAL FACTORS: Time since onset of injury/illness/exacerbation and 1-2 comorbidities: anxiety/depression, lupus  are also  affecting patient's functional outcome.   REHAB POTENTIAL: Fair given time since injury, symptom severity  CLINICAL DECISION MAKING: Stable/uncomplicated  EVALUATION COMPLEXITY: Low   GOALS: Goals reviewed with patient? No - on eval did discuss role of PT, PT exam findings and POC  SHORT TERM GOALS: Target date: 01/28/2023 Pt will demonstrate appropriate understanding and performance of initially prescribed HEP in order to facilitate improved independence with management of symptoms.  Baseline: HEP provided on eval Goal status: INITIAL   2. Pt will score greater than or equal to 34 on FOTO in order to demonstrate improved perception of function due to symptoms.  Baseline: 15  Goal status: INITIAL   3. Pt will report at least 25% decrease in overall pain levels in past week in order to facilitate improved tolerance to basic ADLs/mobility.   Baseline: 6-9/10  Goal status: INITIAL    LONG TERM GOALS: Target date: 02/25/2023 Pt will score 52 or greater on FOTO in order to demonstrate improved perception of function due to symptoms.  Baseline: 15 Goal status: INITIAL  2.  Pt will be able to achieve at least neutral for active ankle dorsiflexion on RLE in order to facilitate improved mechanics for WB activities.  Baseline: see ROM chart above Goal status: INITIAL  3.  Pt will demonstrate appropriate performance of final prescribed HEP in order to facilitate improved self-management of symptoms post-discharge.   Baseline: initial  HEP prescribed  Goal status: INITIAL    4.  Pt will be able to perform 5xSTS in less than or equal to 13sec in order to demonstrate reduced fall risk and improved functional independence (MCID of 2.3sec, age cohort norm 6.2+/-1.3sec per Billie Ruddy et al 2007)  Baseline: 18sec with CAM boot and UE support (essentially NWB) Goal status: INITIAL   5. Pt will be able to perform TUG in less than or equal to 13 sec with LRAD in order to indicate reduced risk of  falling (cutoff score for fall risk 13.5 sec in community dwelling older adults per [facility] et al, 2000)  Baseline: 23 sec with axillary crutches and CAM boot   Goal status: INITIAL    6. Pt will report at least 50% decrease in overall pain levels in past week in order to facilitate improved tolerance to basic ADLs/mobility.   Baseline: 6-9/10  Goal status: INITIAL    7. Pt will be able to tolerate donning/doffing typical footwear to RLE in order to promote return to PLOF.  Baseline: utilizing CAM boot, unable to tolerate shoes due to hypersensitivity to touch  Goal status: INITIAL    PLAN:  PT FREQUENCY: 2x/week  PT DURATION: 8 weeks  PLANNED INTERVENTIONS: Therapeutic exercises, Therapeutic activity, Neuromuscular re-education, Balance training, Gait training, Patient/Family education, Self Care, Joint mobilization, Stair training, Aquatic Therapy, Cryotherapy, Moist heat, Taping, Manual therapy, and Re-evaluation  PLAN FOR NEXT SESSION: Review/update HEP PRN. Anticipate gentle mobility, desensitization techniques for hypersensitivity along lateral ankle, and gradual progression with WB activity. Symptom modification strategies as indicated/appropriate, pt reports heat is provocative.    Garen Lah, PT, ATRIC Certified Exercise Expert for the Aging Adult  01/03/23 1:21 PM Phone: [phone] Fax: [phone]   Medicaid authorization:  Check all possible CPT codes: 29562 - PT Re-evaluation, 97110- Therapeutic Exercise, [phone]- Neuro Re-education, [phone] - Gait Training, [phone] - Manual Therapy, 97530 - Therapeutic Activities, [phone] - Self Care, [phone] - Physical performance training, and [phone] - Aquatic therapy    Check all conditions that are expected to impact treatment: Musculoskeletal disorders   If treatment provided at initial evaluation, no treatment charged due to lack of authorization.      Discharge addendum 03/11/2023  PHYSICAL THERAPY DISCHARGE  SUMMARY  Visits from Start of Care: 2  Current functional level related to goals / functional outcomes: Unable to be assessed   Remaining deficits: Unable to be assessed   Education / Equipment: Unable to be assessed  Patient goals were unable to be assessed. Patient is being discharged due to not returning since the last visit.   Ashley Murrain PT, DPT 03/11/2023 10:25 AM

## 2023-01-06 ENCOUNTER — Telehealth: Payer: Self-pay | Admitting: Physical Therapy

## 2023-01-06 ENCOUNTER — Ambulatory Visit: Payer: Medicaid Other | Admitting: Physical Therapy

## 2023-01-06 NOTE — Telephone Encounter (Signed)
Left voicemail regarding no show to appointment. Left reminder of attendance policy and next week's appointment dates/times.

## 2023-01-12 ENCOUNTER — Ambulatory Visit: Payer: Medicaid Other | Admitting: Physical Therapy

## 2023-01-14 ENCOUNTER — Encounter: Payer: Medicaid Other | Admitting: Physical Therapy

## 2023-01-18 ENCOUNTER — Ambulatory Visit: Payer: Medicaid Other | Admitting: Physical Therapy

## 2023-01-20 ENCOUNTER — Ambulatory Visit: Payer: Medicaid Other | Admitting: Physical Therapy

## 2023-01-25 ENCOUNTER — Other Ambulatory Visit (HOSPITAL_COMMUNITY): Payer: Self-pay

## 2023-01-26 ENCOUNTER — Other Ambulatory Visit (HOSPITAL_COMMUNITY): Payer: Self-pay

## 2023-01-29 ENCOUNTER — Encounter: Payer: Self-pay | Admitting: Obstetrics and Gynecology

## 2023-01-29 DIAGNOSIS — Z30011 Encounter for initial prescription of contraceptive pills: Secondary | ICD-10-CM

## 2023-01-31 MED ORDER — SLYND 4 MG PO TABS: 1.0000 | ORAL_TABLET | Freq: Every day | ORAL | 3 refills | Status: DC

## 2023-04-07 ENCOUNTER — Inpatient Hospital Stay (HOSPITAL_COMMUNITY)
Admission: AD | Admit: 2023-04-07 | Discharge: 2023-04-07 | Disposition: A | Discharge status: Home / Self Care | Payer: Medicaid Other | Attending: Obstetrics and Gynecology | Admitting: Obstetrics and Gynecology

## 2023-04-07 ENCOUNTER — Encounter: Payer: Self-pay | Admitting: Obstetrics and Gynecology

## 2023-04-07 DIAGNOSIS — R109 Unspecified abdominal pain: Secondary | ICD-10-CM | POA: Diagnosis not present

## 2023-04-07 DIAGNOSIS — Z3202 Encounter for pregnancy test, result negative: Secondary | ICD-10-CM

## 2023-04-07 LAB — POCT PREGNANCY, URINE: Preg Test, Ur: NEGATIVE

## 2023-04-07 LAB — HCG, QUANTITATIVE, PREGNANCY: hCG, Beta Chain, Quant, S: 14 m[IU]/mL — ABNORMAL HIGH (ref <5)

## 2023-04-07 MED ORDER — CYCLOBENZAPRINE HCL 5 MG PO TABS: 10.0000 mg | ORAL_TABLET | Freq: Once | ORAL | Status: DC

## 2023-04-07 NOTE — MAU Provider Note (Signed)
Event Date/Time   First Provider Initiated Contact with Patient 04/07/23 1747      S Ms. Felicia Davila is a 32 y.o. [id number] patient who presents to MAU today with complaint of abdominal pain and spotting. Patient reports she had spotting 10 days ago and noted she had some pain.  She states 3 days ago the pain worsened and has continued to get progressively worse.  She states yesterday she noted some spotting and was concerning because she hasn't had a menstrual cycle on Slynd.  She states the pain is on the left side abdomen and radiates to her back.  She describes it as a "crampy" feeling, but gets worse when "the sharp pain hits."  She states she has taken ibuprofen and tylenol w/o relief.   O BP 122/84   Pulse 76   Temp 97.7 F (36.5 C)   Resp 18   Ht 5\' 6"  (1.676 m)   Wt 73.5 kg   LMP  (LMP Unknown)   BMI 26.15 kg/m  Physical Exam Vitals and nursing note reviewed.  Constitutional:      General: She is not in acute distress.    Appearance: She is well-developed.  HENT:     Head: Normocephalic and atraumatic.  Eyes:     Conjunctiva/sclera: Conjunctivae normal.  Cardiovascular:     Rate and Rhythm: Normal rate.  Pulmonary:     Effort: Pulmonary effort is normal. No respiratory distress.  Musculoskeletal:     Cervical back: Normal range of motion.  Neurological:     Mental Status: She is alert and oriented to person, place, and time.  Psychiatric:        Mood and Affect: Mood normal.        Behavior: Behavior normal.     A Medical screening exam complete Negative UPT  P Labs collected and will contact patient, via mychart, regarding POC.  Discharge from MAU in stable condition. Precautions reviewed.  Warning signs for worsening condition that would warrant emergency follow-up discussed Patient may return to MAU as needed   Gerrit Heck, [place] 04/07/2023 5:47 PM

## 2023-04-07 NOTE — MAU Note (Signed)
.  Felicia Davila is a 32 y.o. at Unknown here in MAU reporting:had 3 positive HPT . Pt having pain on left side and feels dizzy and "feels pregnant". Pt does have history of ectopic pregnancy. Pt has had some light spotting for 2-3 days.  LMP: pt on birth control pills does not have periods.  Onset of complaint: 3 days Pain score: 7-10 Vitals:   04/07/23 1724  BP: 122/84  Pulse: 76  Resp: 18  Temp: 97.7 F (36.5 C)     FHT:n/a Lab orders placed from triage:

## 2023-04-09 ENCOUNTER — Encounter (HOSPITAL_COMMUNITY): Payer: Self-pay | Admitting: *Deleted

## 2023-04-09 ENCOUNTER — Inpatient Hospital Stay (HOSPITAL_COMMUNITY)
Admission: AD | Admit: 2023-04-09 | Discharge: 2023-04-09 | Disposition: A | Discharge status: Home / Self Care | Payer: Medicaid Other | Attending: Obstetrics & Gynecology | Admitting: Obstetrics & Gynecology

## 2023-04-09 DIAGNOSIS — Z3A00 Weeks of gestation of pregnancy not specified: Secondary | ICD-10-CM | POA: Diagnosis not present

## 2023-04-09 DIAGNOSIS — O00102 Left tubal pregnancy without intrauterine pregnancy: Secondary | ICD-10-CM | POA: Diagnosis present

## 2023-04-09 DIAGNOSIS — R1032 Left lower quadrant pain: Secondary | ICD-10-CM | POA: Diagnosis present

## 2023-04-09 DIAGNOSIS — O3680X0 Pregnancy with inconclusive fetal viability, not applicable or unspecified: Secondary | ICD-10-CM

## 2023-04-09 DIAGNOSIS — Z113 Encounter for screening for infections with a predominantly sexual mode of transmission: Secondary | ICD-10-CM | POA: Diagnosis not present

## 2023-04-09 DIAGNOSIS — Z3201 Encounter for pregnancy test, result positive: Secondary | ICD-10-CM | POA: Diagnosis present

## 2023-04-09 DIAGNOSIS — R1030 Lower abdominal pain, unspecified: Secondary | ICD-10-CM | POA: Diagnosis present

## 2023-04-09 HISTORY — DX: Urinary tract infection, site not specified: N39.0

## 2023-04-09 HISTORY — DX: Benign neoplasm of connective and other soft tissue, unspecified: D21.9

## 2023-04-09 LAB — COMPREHENSIVE METABOLIC PANEL
ALT: 15 U/L (ref 0–44)
AST: 23 U/L (ref 15–41)
Albumin: 4 g/dL (ref 3.5–5.0)
Alkaline Phosphatase: 44 U/L (ref 38–126)
Anion gap: 6 (ref 5–15)
BUN: 7 mg/dL (ref 6–20)
CO2: 26 mmol/L (ref 22–32)
Calcium: 9.4 mg/dL (ref 8.9–10.3)
Chloride: 106 mmol/L (ref 98–111)
Creatinine, Ser: 0.67 mg/dL (ref 0.44–1.00)
GFR, Estimated: >60 mL/min (ref >60)
Glucose, Bld: 94 mg/dL (ref 70–99)
Potassium: 4 mmol/L (ref 3.5–5.1)
Sodium: 138 mmol/L (ref 135–145)
Total Bilirubin: 0.6 mg/dL (ref <1.2)
Total Protein: 7.2 g/dL (ref 6.5–8.1)

## 2023-04-09 LAB — WET PREP, GENITAL
Clue Cells Wet Prep HPF POC: NONE SEEN
Sperm: NONE SEEN
Trich, Wet Prep: NONE SEEN
WBC, Wet Prep HPF POC: <10 (ref <10)
Yeast Wet Prep HPF POC: NONE SEEN

## 2023-04-09 LAB — URINALYSIS, ROUTINE W REFLEX MICROSCOPIC
Bilirubin Urine: NEGATIVE
Glucose, UA: NEGATIVE mg/dL
Ketones, ur: NEGATIVE mg/dL
Leukocytes,Ua: NEGATIVE
Nitrite: NEGATIVE
Protein, ur: NEGATIVE mg/dL
Specific Gravity, Urine: 1.016 (ref 1.005–1.030)
pH: 6 (ref 5.0–8.0)

## 2023-04-09 LAB — HCG, QUANTITATIVE, PREGNANCY: hCG, Beta Chain, Quant, S: 20 m[IU]/mL — ABNORMAL HIGH (ref <5)

## 2023-04-09 NOTE — MAU Note (Signed)
Felicia Davila is a 32 y.o. at Unknown here in MAU reporting: pain has gotten worse, increased pain in lower abd- all the way across.  Having stabbing pain in LLQ. Hx of ectopic on left, rec'd Methotrexate.   Reports dizziness at times and nausea started last night. Onset of complaint: ongoing Pain score: 6 Vitals:   04/09/23 1016  BP: 121/86  Pulse: 80  Resp: 17  Temp: 98.1 F (36.7 C)  SpO2: 99%     Lab orders placed from triage:  lab drawn while in triage   Reports pain with urination.  Asking if we will check her urine.

## 2023-04-09 NOTE — MAU Provider Note (Signed)
History   Chief Complaint:  Labs Only   Felicia Davila is a 32 y.o. [id number] at Unknown who presents for labs.  HCG on 12/12 was 14.  Reports continued lower abdominal cramping. Cramping is bilateral, but at times has sharp pains in her LLQ. Reports history of ectopic pregnancy in left tube that was treated with methotrexate. Denies vaginal bleeding.   Physical Exam   Blood pressure 121/86, pulse 80, temperature 98.1 F (36.7 C), temperature source Oral, resp. rate 17, height 5\' 6"  (1.676 m), weight 72.6 kg, SpO2 99%.  Physical Examination: General appearance - alert, well appearing, and in no distress Mental status - alert, oriented to person, place, and time, normal mood, behavior, speech, dress, motor activity, and thought processes Eyes - pupils equal and reactive, extraocular eye movements intact, sclera anicteric Chest - normal respiratory effort Abdomen - soft, nontender, nondistended, no masses or organomegaly  Labs: Results for orders placed or performed during the hospital encounter of 04/09/23 (from the past 24 hours)  hCG, quantitative, pregnancy   Collection Time: 04/09/23 10:24 AM  Result Value Ref Range   hCG, Beta Chain, Quant, S 20 (H) <5 mIU/mL  Urinalysis, Routine w reflex microscopic -Urine, Clean Catch   Collection Time: 04/09/23 10:24 AM  Result Value Ref Range   Color, Urine YELLOW YELLOW   APPearance HAZY (A) CLEAR   Specific Gravity, Urine 1.016 1.005 - 1.030   pH 6.0 5.0 - 8.0   Glucose, UA NEGATIVE NEGATIVE mg/dL   Hgb urine dipstick SMALL (A) NEGATIVE   Bilirubin Urine NEGATIVE NEGATIVE   Ketones, ur NEGATIVE NEGATIVE mg/dL   Protein, ur NEGATIVE NEGATIVE mg/dL   Nitrite NEGATIVE NEGATIVE   Leukocytes,Ua NEGATIVE NEGATIVE   RBC / HPF 0-5 0 - 5 RBC/hpf   WBC, UA 0-5 0 - 5 WBC/hpf   Bacteria, UA RARE (A) NONE SEEN   Squamous Epithelial / HPF 21-50 0 - 5 /HPF   Mucus PRESENT   Comprehensive metabolic panel   Collection Time: 04/09/23 10:24 AM   Result Value Ref Range   Sodium 138 135 - 145 mmol/L   Potassium 4.0 3.5 - 5.1 mmol/L   Chloride 106 98 - 111 mmol/L   CO2 26 22 - 32 mmol/L   Glucose, Bld 94 70 - 99 mg/dL   BUN 7 6 - 20 mg/dL   Creatinine, Ser 4.54 0.44 - 1.00 mg/dL   Calcium 9.4 8.9 - 09.8 mg/dL   Total Protein 7.2 6.5 - 8.1 g/dL   Albumin 4.0 3.5 - 5.0 g/dL   AST 23 15 - 41 U/L   ALT 15 0 - 44 U/L   Alkaline Phosphatase 44 38 - 126 U/L   Total Bilirubin 0.6 <1.2 mg/dL   GFR, Estimated >11 >91 mL/min   Anion gap 6 5 - 15  Wet prep, genital   Collection Time: 04/09/23 11:04 AM  Result Value Ref Range   Yeast Wet Prep HPF POC NONE SEEN NONE SEEN   Trich, Wet Prep NONE SEEN NONE SEEN   Clue Cells Wet Prep HPF POC NONE SEEN NONE SEEN   WBC, Wet Prep HPF POC <10 <10   Sperm NONE SEEN     Ultrasound Studies:   No results found.  Assessment:   1. Pregnancy of unknown anatomic location     -HCG today is 20. Scheduled for repeat HCG on Monday at [place] (closer to patient).  -She also wanted STD testing. Labs were not collected before she left, would  like them done when she has her HCG drawn Monday. Added to her note for her HCG visit.   Plan: -Discharge home in stable condition -Ectopic precautions discussed -Patient advised to follow-up with [facility] for labs on Monday -Patient may return to MAU as needed or if her condition were to change or worsen  Judeth Horn, NP 04/09/2023, 1:52 PM

## 2023-04-09 NOTE — Discharge Instructions (Signed)
Return to care  If you have heavier bleeding that soaks through more than 2 pads per hour for an hour or more If you bleed so much that you feel like you might pass out or you do pass out If you have significant abdominal pain that is not improved with Tylenol   

## 2023-04-10 ENCOUNTER — Inpatient Hospital Stay (HOSPITAL_COMMUNITY): Admission: RE | Admit: 2023-04-10 | Payer: Medicaid Other | Source: Ambulatory Visit

## 2023-04-10 LAB — CULTURE, OB URINE
Culture: >=100000 — AB
Special Requests: NORMAL

## 2023-04-11 ENCOUNTER — Ambulatory Visit: Payer: Medicaid Other

## 2023-04-11 ENCOUNTER — Ambulatory Visit (INDEPENDENT_AMBULATORY_CARE_PROVIDER_SITE_OTHER): Payer: Medicaid Other

## 2023-04-11 ENCOUNTER — Other Ambulatory Visit: Payer: Self-pay | Admitting: Obstetrics & Gynecology

## 2023-04-11 DIAGNOSIS — O3680X0 Pregnancy with inconclusive fetal viability, not applicable or unspecified: Secondary | ICD-10-CM

## 2023-04-11 DIAGNOSIS — Z3A01 Less than 8 weeks gestation of pregnancy: Secondary | ICD-10-CM

## 2023-04-11 LAB — GC/CHLAMYDIA PROBE AMP (~~LOC~~) NOT AT ARMC
Chlamydia: NEGATIVE
Comment: NEGATIVE
Comment: NORMAL
Neisseria Gonorrhea: NEGATIVE

## 2023-04-11 LAB — BETA HCG QUANT (REF LAB): hCG Quant: 7 m[IU]/mL

## 2023-04-11 NOTE — Progress Notes (Signed)
Pt here for STAT HCG and STD blood work per Judeth Horn, NP

## 2023-04-12 LAB — HIV ANTIBODY (ROUTINE TESTING W REFLEX): HIV Screen 4th Generation wRfx: NONREACTIVE

## 2023-04-12 LAB — HEPATITIS B SURFACE ANTIGEN: Hepatitis B Surface Ag: NEGATIVE

## 2023-04-12 LAB — HEPATITIS C ANTIBODY: Hep C Virus Ab: REACTIVE — AB

## 2023-04-12 LAB — RPR: RPR Ser Ql: NONREACTIVE

## 2023-04-13 ENCOUNTER — Encounter (HOSPITAL_COMMUNITY): Payer: Self-pay | Admitting: Obstetrics & Gynecology

## 2023-04-13 ENCOUNTER — Other Ambulatory Visit: Payer: Medicaid Other

## 2023-04-13 ENCOUNTER — Inpatient Hospital Stay (HOSPITAL_COMMUNITY): Payer: Medicaid Other

## 2023-04-13 ENCOUNTER — Inpatient Hospital Stay (HOSPITAL_COMMUNITY)
Admission: AD | Admit: 2023-04-13 | Discharge: 2023-04-13 | Disposition: A | Discharge status: Home / Self Care | Payer: Medicaid Other | Attending: Obstetrics & Gynecology | Admitting: Obstetrics & Gynecology

## 2023-04-13 VITALS — BP 110/71 | HR 89

## 2023-04-13 DIAGNOSIS — F17290 Nicotine dependence, other tobacco product, uncomplicated: Secondary | ICD-10-CM | POA: Insufficient documentation

## 2023-04-13 DIAGNOSIS — R42 Dizziness and giddiness: Secondary | ICD-10-CM | POA: Diagnosis present

## 2023-04-13 DIAGNOSIS — O3680X0 Pregnancy with inconclusive fetal viability, not applicable or unspecified: Secondary | ICD-10-CM

## 2023-04-13 DIAGNOSIS — O0281 Inappropriate change in quantitative human chorionic gonadotropin (hCG) in early pregnancy: Secondary | ICD-10-CM | POA: Insufficient documentation

## 2023-04-13 DIAGNOSIS — R001 Bradycardia, unspecified: Secondary | ICD-10-CM | POA: Insufficient documentation

## 2023-04-13 DIAGNOSIS — R102 Pelvic and perineal pain: Secondary | ICD-10-CM | POA: Diagnosis not present

## 2023-04-13 DIAGNOSIS — R200 Anesthesia of skin: Secondary | ICD-10-CM | POA: Diagnosis present

## 2023-04-13 DIAGNOSIS — Z3A01 Less than 8 weeks gestation of pregnancy: Secondary | ICD-10-CM

## 2023-04-13 DIAGNOSIS — O039 Complete or unspecified spontaneous abortion without complication: Secondary | ICD-10-CM | POA: Diagnosis not present

## 2023-04-13 DIAGNOSIS — R1032 Left lower quadrant pain: Secondary | ICD-10-CM | POA: Diagnosis present

## 2023-04-13 LAB — COMPREHENSIVE METABOLIC PANEL
ALT: 14 U/L (ref 0–44)
AST: 20 U/L (ref 15–41)
Albumin: 3.8 g/dL (ref 3.5–5.0)
Alkaline Phosphatase: 39 U/L (ref 38–126)
Anion gap: 9 (ref 5–15)
BUN: <5 mg/dL — ABNORMAL LOW (ref 6–20)
CO2: 27 mmol/L (ref 22–32)
Calcium: 9 mg/dL (ref 8.9–10.3)
Chloride: 105 mmol/L (ref 98–111)
Creatinine, Ser: 0.8 mg/dL (ref 0.44–1.00)
GFR, Estimated: >60 mL/min (ref >60)
Glucose, Bld: 87 mg/dL (ref 70–99)
Potassium: 3.7 mmol/L (ref 3.5–5.1)
Sodium: 141 mmol/L (ref 135–145)
Total Bilirubin: 0.4 mg/dL (ref <1.2)
Total Protein: 6.6 g/dL (ref 6.5–8.1)

## 2023-04-13 LAB — CBC
HCT: 37.9 % (ref 36.0–46.0)
Hemoglobin: 12.4 g/dL (ref 12.0–15.0)
MCH: 30 pg (ref 26.0–34.0)
MCHC: 32.7 g/dL (ref 30.0–36.0)
MCV: 91.5 fL (ref 80.0–100.0)
Platelets: 252 10*3/uL (ref 150–400)
RBC: 4.14 MIL/uL (ref 3.87–5.11)
RDW: 12.2 % (ref 11.5–15.5)
WBC: 7 10*3/uL (ref 4.0–10.5)
nRBC: 0 % (ref 0.0–0.2)

## 2023-04-13 LAB — TYPE AND SCREEN
ABO/RH(D): A POS
Antibody Screen: NEGATIVE

## 2023-04-13 LAB — HCG, QUANTITATIVE, PREGNANCY: hCG, Beta Chain, Quant, S: 3 m[IU]/mL (ref <5)

## 2023-04-13 MED ORDER — KETOROLAC TROMETHAMINE 30 MG/ML IJ SOLN: 30.0000 mg | Freq: Once | INTRAMUSCULAR | Status: DC

## 2023-04-13 MED ORDER — ONDANSETRON HCL 4 MG/2ML IJ SOLN: 4.0000 mg | Freq: Once | INTRAMUSCULAR | Status: DC

## 2023-04-13 MED ORDER — LACTATED RINGERS IV BOLUS
1000.0000 mL | Freq: Once | INTRAVENOUS | Status: AC
  Administered 2023-04-13: 1000 mL via INTRAVENOUS

## 2023-04-13 MED ORDER — BUTORPHANOL TARTRATE 1 MG/ML IJ SOLN
1.0000 mg | Freq: Once | INTRAMUSCULAR | Status: AC
  Administered 2023-04-13: 1 mg via INTRAVENOUS
  Filled 2023-04-13: qty 1

## 2023-04-13 MED ORDER — KETOROLAC TROMETHAMINE 10 MG PO TABS: 10.0000 mg | ORAL_TABLET | Freq: Four times a day (QID) | ORAL | 0 refills | Status: DC | PRN

## 2023-04-13 MED ORDER — ONDANSETRON 4 MG PO TBDP: 4.0000 mg | ORAL_TABLET | Freq: Three times a day (TID) | ORAL | 0 refills | Status: DC | PRN

## 2023-04-13 NOTE — MAU Note (Signed)
Felicia Davila is a 32 y.o. at Unknown here in MAU reporting: has been getting monitored for PUL.  Started having pain all over abd this morningl pain was horrible, lasted about .  Then  started on LLQ, now is still there- feels like she is being stabbed. [place]  really dizzy, feels really weak. Is spotting- just started. "Can feel her pulse, feels really fast, than really slow. Was over at [facility] to get blood work  drawn, they didn't like the way she looked, thought she was pale, so sent her here.  Left arm feels funny, like it is asleep Onset of complaint: this morning around 0930 Pain score: 9 Vitals:   04/13/23 1356 04/13/23 1357  BP:  115/78  Pulse:  80  Resp:  18  Temp:  98.1 F (36.7 C)  SpO2: 99% 100%      Lab orders placed from triage:

## 2023-04-13 NOTE — MAU Provider Note (Signed)
History     CSN: [id number]  Arrival date and time: 04/13/23 1332   Event Date/Time   First Provider Initiated Contact with Patient 04/13/23 1405      Chief Complaint  Patient presents with   Abdominal Pain   Palpitations   Dizziness   Vaginal Bleeding   Fatigue   HPI Ms. Felicia Davila is a 32 y.o. year old [id number] female at Unknown weeks gestation who was sent to MAU from [facility] reporting generalized abdominal pain that started this morning at 0930. She reports the pain as "horrible" and lasted x 10 mins. She states that after that horrible pain subsided, a stabbing pain in the lower LT pelvic area started. She rates this pain a 9/10. She reports feeling "really dizzy and weak." She has spotting that started today. She states she can feel her pulse, feels really fast than really slow. She was at [facility] for repeat blood work and they told her to come here to be evaluated, because "they didn't like how she looked." She reports her LT arm "feels funny like it's asleep."  Review of prior HCG levels: 04/07/2023 = 14, 04/09/2023 = 20, 04/11/2023 = 7  OB History     Gravida  5   Para  2   Term  1   Preterm  1   AB  2   Living  2      SAB  1   IAB      Ectopic  1   Multiple      Live Births  2           Past Medical History:  Diagnosis Date   Anxiety    Depression    Fibroid    Lupus    PONV (postoperative nausea and vomiting)    UTI (urinary tract infection)     Past Surgical History:  Procedure Laterality Date   CESAREAN SECTION     CHOLECYSTECTOMY     DILATION AND EVACUATION N/A 11/06/2021   Procedure: DILATATION AND EVACUATION;  Surgeon: Catalina Antigua, MD;  [facility]: [facility] OR;  Service: Gynecology;  Laterality: N/A;   FOREARM SURGERY Left    HYSTEROSCOPY WITH D & C  12/14/2021   Procedure: DILATATION AND CURETTAGE /HYSTEROSCOPY;  Surgeon: Warden Fillers, MD;  Location: [facility] OR;  Service: Gynecology;;    Family History  Problem Relation Age  of Onset   Lymphoma Mother    Clotting disorder Mother    Lupus Father    COPD Father     Social History   Tobacco Use   Smoking status: Former    Types: Cigarettes   Smokeless tobacco: Never  Vaping Use   Vaping status: Every Day   Substances: Nicotine  Substance Use Topics   Alcohol use: No   Drug use: No    Allergies:  Allergies  Allergen Reactions   Morphine And Codeine Anaphylaxis, Rash and Other (See Comments)    Per extensive conversation with patient, surgical attending and surgical PA, patient does NOT have an anaphylactic reaction to morphine. Has taken for short periods in the past without issues. Pins and needle feeling in extremities. (??)     Nitrofurantoin Nausea And Vomiting and Other (See Comments)    Also, "felt "weird in the head"    Fentanyl Other (See Comments)    "Makes Pt hot and tingly"   Hydrocodone Other (See Comments)    Pins and needle feeling in extremities   Meloxicam  N/v   Naproxen     N/v   Chlorhexidine Itching and Rash    Medications Prior to Admission  Medication Sig Dispense Refill Last Dose/Taking   cetirizine (ZYRTEC ALLERGY) 10 MG tablet Take 1 tablet (10 mg total) by mouth daily. 30 tablet 0    ondansetron (ZOFRAN) 4 MG tablet Take 1 tablet (4 mg total) by mouth every 8 (eight) hours as needed for nausea or vomiting. 20 tablet 0     Review of Systems  Constitutional:        "Just not feeling right"  HENT: Negative.    Eyes: Negative.   Respiratory: Negative.    Cardiovascular: Negative.   Gastrointestinal: Negative.   Endocrine: Negative.   Genitourinary:  Positive for pelvic pain (lower LT pelvic pain and mid-pelvis pain; "stabbing") and vaginal bleeding (spotting).  Skin:  Positive for pallor.  Allergic/Immunologic: Negative.   Neurological:  Positive for dizziness, syncope, weakness (generalized and in LT arm), light-headedness and numbness (LT arm).  Psychiatric/Behavioral: Negative.     Physical Exam    Blood pressure 123/77, pulse 74, temperature 98.1 F (36.7 C), temperature source Oral, resp. rate 16, height 5\' 6"  (1.676 m), weight 73 kg, SpO2 100%. Orthostatic VS for the past 24 hrs (Last 3 readings):  BP- Lying Pulse- Lying BP- Sitting Pulse- Sitting BP- Standing at 0 minutes Pulse- Standing at 0 minutes BP- Standing at 3 minutes Pulse- Standing at 3 minutes  04/13/23 1435 -- -- -- -- -- -- 123/77 74  04/13/23 1432 -- -- -- -- 116/72 82 -- --  04/13/23 1430 -- -- 111/76 72 -- -- -- --  04/13/23 1428 103/65 62 -- -- -- -- -- --    Physical Exam Vitals and nursing note reviewed.  Constitutional:      Appearance: Normal appearance. She is normal weight.  Cardiovascular:     Rate and Rhythm: Normal rate.  Pulmonary:     Effort: Pulmonary effort is normal.  Abdominal:     General: Abdomen is flat.     Palpations: Abdomen is soft.  Skin:    General: Skin is warm and dry.     Coloration: Skin is pale.  Neurological:     Mental Status: She is alert and oriented to person, place, and time.  Psychiatric:        Mood and Affect: Mood normal.        Behavior: Behavior normal.        Thought Content: Thought content normal.        Judgment: Judgment normal.   MAU Course  Procedures  MDM Start IV LR Bolus 1 liter @ 999 ml/hr CBC CMP HCG T&S EKG -- Normal EKG with sinus bradycardia per Dr. Myrtie Cruise Orthostatic VS  Results for orders placed or performed during the hospital encounter of 04/13/23 (from the past 24 hours)  CBC     Status: None   Collection Time: 04/13/23  2:50 PM  Result Value Ref Range   WBC 7.0 4.0 - 10.5 K/uL   RBC 4.14 3.87 - 5.11 MIL/uL   Hemoglobin 12.4 12.0 - 15.0 g/dL   HCT 16.1 09.6 - 04.5 %   MCV 91.5 80.0 - 100.0 fL   MCH 30.0 26.0 - 34.0 pg   MCHC 32.7 30.0 - 36.0 g/dL   RDW 40.9 81.1 - 91.4 %   Platelets 252 150 - 400 K/uL   nRBC 0.0 0.0 - 0.2 %  hCG, quantitative, pregnancy  Status: None   Collection Time: 04/13/23  2:50 PM   Result Value Ref Range   hCG, Beta Chain, Quant, S 3 <5 mIU/mL  Comprehensive metabolic panel     Status: Abnormal   Collection Time: 04/13/23  2:50 PM  Result Value Ref Range   Sodium 141 135 - 145 mmol/L   Potassium 3.7 3.5 - 5.1 mmol/L   Chloride 105 98 - 111 mmol/L   CO2 27 22 - 32 mmol/L   Glucose, Bld 87 70 - 99 mg/dL   BUN <5 (L) 6 - 20 mg/dL   Creatinine, Ser 9.38 0.44 - 1.00 mg/dL   Calcium 9.0 8.9 - 18.2 mg/dL   Total Protein 6.6 6.5 - 8.1 g/dL   Albumin 3.8 3.5 - 5.0 g/dL   AST 20 15 - 41 U/L   ALT 14 0 - 44 U/L   Alkaline Phosphatase 39 38 - 126 U/L   Total Bilirubin 0.4 <1.2 mg/dL   GFR, Estimated >99 >37 mL/min   Anion gap 9 5 - 15  Type and screen     Status: None   Collection Time: 04/13/23  2:50 PM  Result Value Ref Range   ABO/RH(D) A POS    Antibody Screen NEG    Sample Expiration      04/16/2023,2359 Performed at [facility], [address] [place], [place] [address]      Assessment and Plan  1. Miscarriage at 8 to [redacted] weeks gestation (Primary) - Information provided on miscarriage and managing pregnancy loss - Advised to NOT have sex until after she is seen in the office for her f/u appt  2. Pelvic pain - Information provided on pelvic pain   - Discharge patient - Keep scheduled F/U appt with [facility] on 04/28/2023 - Patient verbalized an understanding of the plan of care and agrees.  Raelyn Mora, CNM 04/13/2023, 2:05 PM

## 2023-04-13 NOTE — Progress Notes (Signed)
Pt presents for repeat bHCG. Reports feeling very dizzy. Had severe left sided abd pain in the nighttime. Spotting, no heavy bleeding. VSS. Advised pt to seek care in MAU. Suggested pt have someone drive her to MAU. Pt declines.

## 2023-04-13 NOTE — Discharge Instructions (Signed)
Return to MAU: If you have heavy bleeding that soaks through more that 2 pads per hour for an hour or more If you bleed so much that you feel like you might pass out or you do pass out If you have significant abdominal pain that is not improved with Tylenol 1000 mg every 8 hours as needed for pain If you develop a fever > 100.5

## 2023-04-14 ENCOUNTER — Other Ambulatory Visit: Payer: Medicaid Other

## 2023-04-14 LAB — BETA HCG QUANT (REF LAB): hCG Quant: 2 m[IU]/mL

## 2023-04-15 LAB — HCV AB W REFLEX TO QUANT PCR: HCV Ab: REACTIVE — AB

## 2023-04-15 LAB — HCV RT-PCR, QUANT (NON-GRAPH): Hepatitis C Quantitation: NOT DETECTED [IU]/mL

## 2023-04-15 LAB — SPECIMEN STATUS REPORT

## 2023-04-21 ENCOUNTER — Ambulatory Visit: Payer: Medicaid Other

## 2023-04-21 ENCOUNTER — Encounter: Payer: Self-pay | Admitting: Obstetrics & Gynecology

## 2023-04-28 ENCOUNTER — Ambulatory Visit: Payer: Medicaid Other | Admitting: Obstetrics and Gynecology

## 2023-05-13 ENCOUNTER — Encounter: Payer: Self-pay | Admitting: Obstetrics and Gynecology

## 2023-05-16 ENCOUNTER — Telehealth: Payer: Self-pay | Admitting: *Deleted

## 2023-05-16 NOTE — Telephone Encounter (Signed)
Patient will keep appointment for F/U with [facility] and then call to schedule New OB with [facility].

## 2023-05-19 ENCOUNTER — Encounter: Payer: Self-pay | Admitting: Advanced Practice Midwife

## 2023-05-19 ENCOUNTER — Encounter (HOSPITAL_COMMUNITY): Payer: Self-pay | Admitting: Obstetrics and Gynecology

## 2023-05-19 ENCOUNTER — Inpatient Hospital Stay (HOSPITAL_COMMUNITY): Payer: Medicaid Other

## 2023-05-19 ENCOUNTER — Inpatient Hospital Stay (HOSPITAL_COMMUNITY)
Admission: AD | Admit: 2023-05-19 | Discharge: 2023-05-19 | Discharge status: Against Medical Advice | Payer: Medicaid Other | Attending: Obstetrics and Gynecology | Admitting: Obstetrics and Gynecology

## 2023-05-19 DIAGNOSIS — O99331 Smoking (tobacco) complicating pregnancy, first trimester: Secondary | ICD-10-CM | POA: Insufficient documentation

## 2023-05-19 DIAGNOSIS — Z3A01 Less than 8 weeks gestation of pregnancy: Secondary | ICD-10-CM | POA: Insufficient documentation

## 2023-05-19 DIAGNOSIS — R1032 Left lower quadrant pain: Secondary | ICD-10-CM | POA: Diagnosis present

## 2023-05-19 DIAGNOSIS — Z3201 Encounter for pregnancy test, result positive: Secondary | ICD-10-CM | POA: Diagnosis not present

## 2023-05-19 DIAGNOSIS — O09291 Supervision of pregnancy with other poor reproductive or obstetric history, first trimester: Secondary | ICD-10-CM | POA: Diagnosis not present

## 2023-05-19 DIAGNOSIS — O3680X0 Pregnancy with inconclusive fetal viability, not applicable or unspecified: Secondary | ICD-10-CM | POA: Insufficient documentation

## 2023-05-19 DIAGNOSIS — F17290 Nicotine dependence, other tobacco product, uncomplicated: Secondary | ICD-10-CM | POA: Insufficient documentation

## 2023-05-19 LAB — ABO/RH: ABO/RH(D): A POS

## 2023-05-19 LAB — COMPREHENSIVE METABOLIC PANEL
ALT: 13 U/L (ref 0–44)
AST: 20 U/L (ref 15–41)
Albumin: 3.8 g/dL (ref 3.5–5.0)
Alkaline Phosphatase: 40 U/L (ref 38–126)
Anion gap: 8 (ref 5–15)
BUN: 6 mg/dL (ref 6–20)
CO2: 25 mmol/L (ref 22–32)
Calcium: 9.1 mg/dL (ref 8.9–10.3)
Chloride: 105 mmol/L (ref 98–111)
Creatinine, Ser: 0.89 mg/dL (ref 0.44–1.00)
GFR, Estimated: >60 mL/min (ref >60)
Glucose, Bld: 92 mg/dL (ref 70–99)
Potassium: 3.4 mmol/L — ABNORMAL LOW (ref 3.5–5.1)
Sodium: 138 mmol/L (ref 135–145)
Total Bilirubin: 0.5 mg/dL (ref 0.0–1.2)
Total Protein: 6.7 g/dL (ref 6.5–8.1)

## 2023-05-19 LAB — CBC
HCT: 36 % (ref 36.0–46.0)
Hemoglobin: 12.2 g/dL (ref 12.0–15.0)
MCH: 30.1 pg (ref 26.0–34.0)
MCHC: 33.9 g/dL (ref 30.0–36.0)
MCV: 88.9 fL (ref 80.0–100.0)
Platelets: 230 10*3/uL (ref 150–400)
RBC: 4.05 MIL/uL (ref 3.87–5.11)
RDW: 12.6 % (ref 11.5–15.5)
WBC: 5.6 10*3/uL (ref 4.0–10.5)
nRBC: 0 % (ref 0.0–0.2)

## 2023-05-19 LAB — HCG, QUANTITATIVE, PREGNANCY: hCG, Beta Chain, Quant, S: 166 m[IU]/mL — ABNORMAL HIGH (ref <5)

## 2023-05-19 MED ORDER — OXYCODONE HCL 5 MG PO TABS: 5.0000 mg | ORAL_TABLET | Freq: Once | ORAL | Status: DC

## 2023-05-19 MED ORDER — OXYCODONE HCL 5 MG PO TABS
5.0000 mg | ORAL_TABLET | Freq: Once | ORAL | Status: AC
  Administered 2023-05-19: 5 mg via ORAL
  Filled 2023-05-19: qty 1

## 2023-05-19 NOTE — MAU Note (Addendum)
...  Felicia Davila is a 33 y.o. at Unknown here in MAU reporting: Here for ectopic eval. She reports ongoing left lower abdominal pain that has worsened in the past couple of days. She reports she had an ectopic previously on her left side and received methotrexate. Denies pain. Denies VB and spotting.  Had a miscarriage in December. Unsure of LMP. She reports she is unsure if she got pregnant at the end of December or in January.  If she has to have repeat labs, she prefers to have redraws at [facility].  Last took 1000 mg Tylenol around 1630 and 600 mg Ibuprofen at 1630  LMP: Unsure Onset of complaint: Ongoing Pain score: 8/10 left lower abdomen  Vitals:   05/19/23 1818  BP: 115/70  Pulse: 77  Resp: 16  Temp: 98 F (36.7 C)  SpO2: 100%      FHT: n/a Lab orders placed from triage: UA

## 2023-05-19 NOTE — MAU Note (Signed)
[place] called patient from lobby twice from [place] and patient was not there.  This RN called patient again and patient was not in lobby.  Provider notified.

## 2023-05-19 NOTE — MAU Provider Note (Addendum)
Chief Complaint: Abdominal Pain    SUBJECTIVE HPI: Felicia Davila is a 33 y.o. [id number] at Unknown by LMP who presents to maternity admissions reporting here for LLQ pain 8/10 for the past couple of days. Positive pregnancy test 1/17, with PUL. bHCG trending: 32(1/17)->61(1/19).   Of note had a miscarriage in December with bHCG of 2 on 12/19, and Ectopic treatment with Methotrexate in April 2024.  She denies vaginal bleeding, vaginal itching/burning, urinary symptoms, h/a, dizziness, n/v, or fever/chills.     HPI  Past Medical History:  Diagnosis Date   Anxiety    Depression    Fibroid    Lupus    PONV (postoperative nausea and vomiting)    UTI (urinary tract infection)    Past Surgical History:  Procedure Laterality Date   CESAREAN SECTION     CHOLECYSTECTOMY     DILATION AND EVACUATION N/A 11/06/2021   Procedure: DILATATION AND EVACUATION;  Surgeon: Catalina Antigua, MD;  Location[facility] OR;  Service: Gynecology;  Laterality: N/A;   FOREARM SURGERY Left    HYSTEROSCOPY WITH D & C  12/14/2021   Procedure: DILATATION AND CURETTAGE /HYSTEROSCOPY;  Surgeon: Warden Fillers, MD;  Location: [facility] OR;  Service: Gynecology;;   Social History   Socioeconomic History   Marital status: Married    Spouse name: Not on file   Number of children: Not on file   Years of education: Not on file   Highest education level: Not on file  Occupational History   Not on file  Tobacco Use   Smoking status: Former    Types: Cigarettes   Smokeless tobacco: Never  Vaping Use   Vaping status: Every Day   Substances: Nicotine  Substance and Sexual Activity   Alcohol use: No   Drug use: No   Sexual activity: Yes    Birth control/protection: None  Other Topics Concern   Not on file  Social History Narrative   Not on file   Social Drivers of Health   Financial Resource Strain: Not on file  Food Insecurity: Medium Risk (01/22/2023)   Received from [facility]   Hunger Vital Sign    Worried  About Running Out of Food in the Last Year: Sometimes true    Ran Out of Food in the Last Year: Sometimes true  Transportation Needs: No Transportation Needs (01/22/2023)   Received from [facility]    In the past 12 months, has lack of reliable transportation kept you from medical appointments, meetings, work or from getting things needed for daily living? : No  Physical Activity: Not on file  Stress: Not on file  Social Connections: Unknown (02/15/2023)   Received from [facility]   Social Network    Social Network: Not on file  Intimate Partner Violence: Unknown (02/15/2023)   Received from [facility]   HITS    Physically Hurt: Not on file    Insult or Talk Down To: Not on file    Threaten Physical Harm: Not on file    Scream or Curse: Not on file   No current facility-administered medications on file prior to encounter.   Current Outpatient Medications on File Prior to Encounter  Medication Sig Dispense Refill   cetirizine (ZYRTEC ALLERGY) 10 MG tablet Take 1 tablet (10 mg total) by mouth daily. 30 tablet 0   ketorolac (TORADOL) 10 MG tablet Take 1 tablet (10 mg total) by mouth every 6 (six) hours as needed for moderate pain (pain  score 4-6). 30 tablet 0   ondansetron (ZOFRAN-ODT) 4 MG disintegrating tablet Take 1 tablet (4 mg total) by mouth every 8 (eight) hours as needed for nausea or vomiting. 15 tablet 0   Allergies  Allergen Reactions   Morphine And Codeine Anaphylaxis, Rash and Other (See Comments)    Per extensive conversation with patient, surgical attending and surgical PA, patient does NOT have an anaphylactic reaction to morphine. Has taken for short periods in the past without issues. Pins and needle feeling in extremities. (??)     Nitrofurantoin Nausea And Vomiting and Other (See Comments)    Also, "felt "weird in the head"    Fentanyl Other (See Comments)    "Makes Pt hot and tingly"   Hydrocodone Other (See Comments)    Pins and  needle feeling in extremities   Meloxicam     N/v   Naproxen     N/v   Chlorhexidine Itching and Rash    I have reviewed patient's Past Medical Hx, Surgical Hx, Family Hx, Social Hx, medications and allergies.   ROS:  Review of Systems Review of Systems  Other systems negative   Physical Exam  Physical Exam Patient Vitals for the past 24 hrs:  BP Temp Temp src Pulse Resp SpO2 Height  05/19/23 1818 115/70 98 F (36.7 C) Oral 77 16 100 % 5\' 6"  (1.676 m)   Constitutional: Well-developed, well-nourished female in no acute distress.  Cardiovascular: normal rate Respiratory: normal effort GI: Abd soft, mild LLQ tenderness. Pos BS x 4 MS: Extremities nontender, no edema, normal ROM Neurologic: Alert and oriented x 4.  GU: Neg CVAT.   LAB RESULTS Results for orders placed or performed during the hospital encounter of 05/19/23 (from the past 24 hours)  ABO/Rh     Status: None   Collection Time: 05/19/23  6:00 PM  Result Value Ref Range   ABO/RH(D) A POS    No rh immune globuloin      NOT A RH IMMUNE GLOBULIN CANDIDATE, PT RH POSITIVE Performed at [facility], [address], [place], [place] [address]   CBC     Status: None   Collection Time: 05/19/23  6:03 PM  Result Value Ref Range   WBC 5.6 4.0 - 10.5 K/uL   RBC 4.05 3.87 - 5.11 MIL/uL   Hemoglobin 12.2 12.0 - 15.0 g/dL   HCT 10.2 72.5 - 36.6 %   MCV 88.9 80.0 - 100.0 fL   MCH 30.1 26.0 - 34.0 pg   MCHC 33.9 30.0 - 36.0 g/dL   RDW 44.0 34.7 - 42.5 %   Platelets 230 150 - 400 K/uL   nRBC 0.0 0.0 - 0.2 %  Comprehensive metabolic panel     Status: Abnormal   Collection Time: 05/19/23  6:03 PM  Result Value Ref Range   Sodium 138 135 - 145 mmol/L   Potassium 3.4 (L) 3.5 - 5.1 mmol/L   Chloride 105 98 - 111 mmol/L   CO2 25 22 - 32 mmol/L   Glucose, Bld 92 70 - 99 mg/dL   BUN 6 6 - 20 mg/dL   Creatinine, Ser 9.56 0.44 - 1.00 mg/dL   Calcium 9.1 8.9 - 38.7 mg/dL   Total Protein 6.7 6.5 - 8.1 g/dL   Albumin  3.8 3.5 - 5.0 g/dL   AST 20 15 - 41 U/L   ALT 13 0 - 44 U/L   Alkaline Phosphatase 40 38 - 126 U/L   Total  Bilirubin 0.5 0.0 - 1.2 mg/dL   GFR, Estimated >60 >73 mL/min   Anion gap 8 5 - 15  hCG, quantitative, pregnancy     Status: Abnormal   Collection Time: 05/19/23  6:03 PM  Result Value Ref Range   hCG, Beta Chain, Quant, S 166 (H) <5 mIU/mL    --/--/A POS (01/23 1800)  IMAGING No results found.  MAU Management/MDM: I have reviewed the triage vital signs and the nursing notes.   Pertinent labs & imaging results that were available during my care of the patient were reviewed by me and considered in my medical decision making (see chart for details).      I have reviewed her medical records including past results, notes and treatments. Medical, Surgical, and family history were reviewed.  Medications and recent lab tests were reviewed  Ordered usual first trimester r/o ectopic labs.   Pelvic exam and cultures done Will check baseline Ultrasound to rule out ectopic.  Treatments in MAU included CBC, CMP, ABO/Rh, bHCG, TVUS, Oxycodone 5mg .   This bleeding/pain can represent a normal pregnancy with bleeding, spontaneous abortion or even an ectopic which can be life-threatening.  The process as listed above helps to determine which of these is present.  ASSESSMENT 1. Pregnancy of unknown anatomic location   PUL with increasing bHCG quant will need continued follow up bHCG, repeat scheduled for MAU Sunday and Tuesday at [facility] office.   CBC wnl Pain improved with oxycodone.  [place] results pending  PLAN Care relinquished to [facility].   Wyn Forster, MD FMOB Fellow, Faculty practice [facility], Center for [facility]  05/19/2023  9:26 PM   MDM:  Pt not in lobby when called for ultrasound.  Called x 3 by RN.  My Chart message sent to patient to return to MAU if pain continues.  F/U hcg scheduled Sunday and Tuesday as noted above.    A/P: Pregnancy of  unknown location  D/C with ectopic precautions  Sharen Counter, CNM 11:05 PM

## 2023-05-20 ENCOUNTER — Encounter: Payer: Medicaid Other | Admitting: Obstetrics and Gynecology

## 2023-05-22 ENCOUNTER — Other Ambulatory Visit (HOSPITAL_COMMUNITY): Payer: Medicaid Other

## 2023-05-23 ENCOUNTER — Ambulatory Visit: Payer: Medicaid Other

## 2023-05-23 ENCOUNTER — Encounter: Payer: Self-pay | Admitting: Obstetrics and Gynecology

## 2023-05-23 ENCOUNTER — Ambulatory Visit: Payer: Medicaid Other | Admitting: Obstetrics and Gynecology

## 2023-05-23 VITALS — BP 114/66 | HR 65 | Ht 66.0 in | Wt 155.0 lb

## 2023-05-23 DIAGNOSIS — O3680X0 Pregnancy with inconclusive fetal viability, not applicable or unspecified: Secondary | ICD-10-CM

## 2023-05-23 DIAGNOSIS — Z3A00 Weeks of gestation of pregnancy not specified: Secondary | ICD-10-CM

## 2023-05-23 NOTE — Progress Notes (Signed)
GYNECOLOGY OFFICE VISIT NOTE  History:   Felicia Davila is a 33 y.o. [phone] here today for follow up from MAU - pregnancy of unknown location. She has LLQ pain that intermittently worsens. She has some vaginal bleeding that is limited to spotting. She has not yet had an [place] with this pregnancy because she was unable to wait at MAU due to child care.     Past Medical History:  Diagnosis Date   Anxiety    Depression    Fibroid    Lupus    PONV (postoperative nausea and vomiting)    UTI (urinary tract infection)     Past Surgical History:  Procedure Laterality Date   CESAREAN SECTION     CHOLECYSTECTOMY     DILATION AND EVACUATION N/A 11/06/2021   Procedure: DILATATION AND EVACUATION;  Surgeon: Catalina Antigua, MD;  Location: [facility] OR;  Service: Gynecology;  Laterality: N/A;   FOREARM SURGERY Left    HYSTEROSCOPY WITH D & C  12/14/2021   Procedure: DILATATION AND CURETTAGE /HYSTEROSCOPY;  Surgeon: Warden Fillers, MD;  Location: [facility] OR;  Service: Gynecology;;    The following portions of the patient's history were reviewed and updated as appropriate: allergies, current medications, past family history, past medical history, past social history, past surgical history and problem list.   Health Maintenance:   Diagnosis  Date Value Ref Range Status  01/11/2022   Final   - Negative for intraepithelial lesion or malignancy (NILM)   Review of Systems:  Pertinent items noted in HPI and remainder of comprehensive ROS otherwise negative.  Physical Exam:  BP 114/66   Pulse 65   Ht 5\' 6"  (1.676 m)   Wt 155 lb (70.3 kg)   LMP  (LMP Unknown) Comment: got pregnant on the pill.  hadn't been having periods  had spotting 2 wks ago and again on Thur.  BMI 25.02 kg/m  CONSTITUTIONAL: Well-developed, well-nourished female in no acute distress.  HEENT:  Normocephalic, atraumatic. External right and left ear normal. No scleral icterus.  NECK: Normal range of motion, supple, no masses noted  on observation SKIN: No rash noted. Not diaphoretic. No erythema. No pallor. MUSCULOSKELETAL: Normal range of motion. No edema noted. NEUROLOGIC: Alert and oriented to person, place, and time. Normal muscle tone coordination. No cranial nerve deficit noted. PSYCHIATRIC: Normal mood and affect. Normal behavior. Normal judgment and thought content.  PELVIC: Deferred  Labs and Imaging Results for orders placed or performed during the hospital encounter of 05/19/23 (from the past week)  ABO/Rh   Collection Time: 05/19/23  6:00 PM  Result Value Ref Range   ABO/RH(D) A POS    No rh immune globuloin      NOT A RH IMMUNE GLOBULIN CANDIDATE, PT RH POSITIVE Performed at [facility], [address], [place], [place] [address]   CBC   Collection Time: 05/19/23  6:03 PM  Result Value Ref Range   WBC 5.6 4.0 - 10.5 K/uL   RBC 4.05 3.87 - 5.11 MIL/uL   Hemoglobin 12.2 12.0 - 15.0 g/dL   HCT 84.6 96.2 - 95.2 %   MCV 88.9 80.0 - 100.0 fL   MCH 30.1 26.0 - 34.0 pg   MCHC 33.9 30.0 - 36.0 g/dL   RDW 84.1 32.4 - 40.1 %   Platelets 230 150 - 400 K/uL   nRBC 0.0 0.0 - 0.2 %  Comprehensive metabolic panel   Collection Time: 05/19/23  6:03 PM  Result Value Ref  Range   Sodium 138 135 - 145 mmol/L   Potassium 3.4 (L) 3.5 - 5.1 mmol/L   Chloride 105 98 - 111 mmol/L   CO2 25 22 - 32 mmol/L   Glucose, Bld 92 70 - 99 mg/dL   BUN 6 6 - 20 mg/dL   Creatinine, Ser 1.61 0.44 - 1.00 mg/dL   Calcium 9.1 8.9 - 09.6 mg/dL   Total Protein 6.7 6.5 - 8.1 g/dL   Albumin 3.8 3.5 - 5.0 g/dL   AST 20 15 - 41 U/L   ALT 13 0 - 44 U/L   Alkaline Phosphatase 40 38 - 126 U/L   Total Bilirubin 0.5 0.0 - 1.2 mg/dL   GFR, Estimated >04 >54 mL/min   Anion gap 8 5 - 15  hCG, quantitative, pregnancy   Collection Time: 05/19/23  6:03 PM  Result Value Ref Range   hCG, Beta Chain, Quant, S 166 (H) <5 mIU/mL   No results found.  Assessment and Plan:   1. Pregnancy of unknown anatomic location  (Primary) Discussed PUL and risk of ectopic given history.  Will check HCG and was able to add on for stat US today.  If [place] normal and HCG rising, will recheck HCG in 2 days.  - Beta hCG quant (ref lab) - US OB LESS THAN 14 WEEKS WITH OB TRANSVAGINAL; Future    No orders of the defined types were placed in this encounter.    Routine preventative health maintenance measures emphasized. Please refer to After Visit Summary for other counseling recommendations.   No follow-ups on file.  Milas Hock, MD, FACOG Obstetrician & Gynecologist, [facility] for [facility], [facility]

## 2023-05-24 ENCOUNTER — Other Ambulatory Visit: Payer: Medicaid Other

## 2023-05-24 LAB — BETA HCG QUANT (REF LAB): hCG Quant: 58 m[IU]/mL

## 2023-05-25 ENCOUNTER — Other Ambulatory Visit: Payer: Self-pay

## 2023-05-25 ENCOUNTER — Other Ambulatory Visit: Payer: Self-pay | Admitting: *Deleted

## 2023-05-25 MED ORDER — OXYCODONE HCL 5 MG PO TABS: 5.0000 mg | ORAL_TABLET | ORAL | 0 refills | Status: DC | PRN

## 2023-05-25 NOTE — Progress Notes (Signed)
Pt called waiting a refill on Toradol or something for the uterine pain due to SAB.

## 2023-05-25 NOTE — Progress Notes (Signed)
error

## 2023-05-30 ENCOUNTER — Other Ambulatory Visit: Payer: Medicaid Other

## 2023-05-31 ENCOUNTER — Other Ambulatory Visit: Payer: Medicaid Other

## 2023-06-01 ENCOUNTER — Other Ambulatory Visit: Payer: Medicaid Other

## 2023-06-09 ENCOUNTER — Telehealth: Payer: Medicaid Other | Admitting: Physician Assistant

## 2023-06-09 ENCOUNTER — Telehealth: Payer: Medicaid Other

## 2023-06-09 DIAGNOSIS — R112 Nausea with vomiting, unspecified: Secondary | ICD-10-CM | POA: Diagnosis not present

## 2023-06-09 MED ORDER — ONDANSETRON 4 MG PO TBDP: 4.0000 mg | ORAL_TABLET | Freq: Three times a day (TID) | ORAL | 0 refills | Status: DC | PRN

## 2023-06-09 NOTE — Patient Instructions (Signed)
  Gershon Cull Hottle, thank you for joining Laure Kidney, PA-C for today's virtual visit.  While this provider is not your primary care provider (PCP), if your PCP is located in our provider database this encounter information will be shared with them immediately following your visit.   A [facility] MyChart account gives you access to today's visit and all your visits, tests, and labs performed at [facility] " click here if you don't have a [facility] MyChart account or go to [email or web address]  Consent: (Patient) Javonne Gelles provided verbal consent for this virtual visit at the beginning of the encounter.  Current Medications:  Current Outpatient Medications:    acetaminophen (TYLENOL) 325 MG tablet, Take by mouth. (Patient not taking: Reported on 05/23/2023), Disp: , Rfl:    cetirizine (ZYRTEC ALLERGY) 10 MG tablet, Take 1 tablet (10 mg total) by mouth daily., Disp: 30 tablet, Rfl: 0   ketorolac (TORADOL) 10 MG tablet, Take 1 tablet (10 mg total) by mouth every 6 (six) hours as needed for moderate pain (pain score 4-6). (Patient not taking: Reported on 05/23/2023), Disp: 30 tablet, Rfl: 0   ondansetron (ZOFRAN-ODT) 4 MG disintegrating tablet, Take 1 tablet (4 mg total) by mouth every 8 (eight) hours as needed for nausea or vomiting., Disp: 15 tablet, Rfl: 0   oxyCODONE (OXY IR/ROXICODONE) 5 MG immediate release tablet, Take 1 tablet (5 mg total) by mouth every 4 (four) hours as needed for severe pain (pain score 7-10) or breakthrough pain., Disp: 4 tablet, Rfl: 0   Medications ordered in this encounter:  No orders of the defined types were placed in this encounter.    *If you need refills on other medications prior to your next appointment, please contact your pharmacy*  Follow-Up: Call back or seek an in-person evaluation if the symptoms worsen or if the condition fails to improve as anticipated.  [facility] [phone]  Other  Instructions Report to ER or urgent care immediately if your symptoms do not improve.    If you have been instructed to have an in-person evaluation today at a local Urgent Care facility, please use the link below. It will take you to a list of all of our available [facility], including address, phone number and hours of operation. Please do not delay care.  [facility]  If you or a family member do not have a primary care provider, use the link below to schedule a visit and establish care. When you choose a [facility] primary care physician or advanced practice provider, you gain a long-term partner in health. Find a Primary Care Provider  Learn more about 's in-office and virtual care options:  - Get Care Now

## 2023-06-09 NOTE — Progress Notes (Signed)
Virtual Visit Consent   Felicia Davila, you are scheduled for a virtual visit with a [facility] provider today. Just as with appointments in the office, your consent must be obtained to participate. Your consent will be active for this visit and any virtual visit you may have with one of our providers in the next 365 days. If you have a MyChart account, a copy of this consent can be sent to you electronically.  As this is a virtual visit, video technology does not allow for your provider to perform a traditional examination. This may limit your provider's ability to fully assess your condition. If your provider identifies any concerns that need to be evaluated in person or the need to arrange testing (such as labs, EKG, etc.), we will make arrangements to do so. Although advances in technology are sophisticated, we cannot ensure that it will always work on either your end or our end. If the connection with a video visit is poor, the visit may have to be switched to a telephone visit. With either a video or telephone visit, we are not always able to ensure that we have a secure connection.  By engaging in this virtual visit, you consent to the provision of healthcare and authorize for your insurance to be billed (if applicable) for the services provided during this visit. Depending on your insurance coverage, you may receive a charge related to this service.  I need to obtain your verbal consent now. Are you willing to proceed with your visit today? Vonnetta Arif has provided verbal consent on 06/09/2023 for a virtual visit (video or telephone). Laure Kidney, [place]  Date: 06/09/2023 8:52 PM   Virtual Visit via Video Note   I, Laure Kidney, connected with  Felicia Davila  ([id number], [birth year]) on 06/09/23 at  8:45 PM EST by a video-enabled telemedicine application and verified that I am speaking with the correct person using two identifiers.  Location: Patient: Virtual Visit Location  Patient: Home Provider: Virtual Visit Location Provider: Home Office   I discussed the limitations of evaluation and management by telemedicine and the availability of in person appointments. The patient expressed understanding and agreed to proceed.    History of Present Illness: Felicia Davila is a 33 y.o. who identifies as a female who was assigned female at birth, and is being seen today for nausea, vomiting, and chills. .She denies any abdominal pain however reports 3-4 episodes of emesis. Was recently seen by OBGYN and denies pregnancy concerns. Denies, fever, chest pain, recent travel, food allergy, shortness of breath. Does endorse sick contacts.   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 Has taken for short periods in the past without issues   Morphine And Codeine Anaphylaxis, Rash and Other (See Comments)    Per extensive conversation with patient, surgical attending and surgical PA, patient does NOT have an anaphylactic reaction to morphine. Has taken for short periods  in the past without issues. Pins and needle feeling in extremities. (??)     Nitrofurantoin Nausea And Vomiting and Other (See Comments)    Also, "felt "weird in the head"    Fentanyl Other (See Comments)    "Makes Pt hot and tingly"   Hydrocodone Other (See Comments)    Pins and needle  feeling in extremities   Meloxicam     N/v   Naproxen     N/v   Chlorhexidine Itching and Rash   Medications:  Current Outpatient Medications:    acetaminophen (TYLENOL) 325 MG tablet, Take by mouth. (Patient not taking: Reported on 05/23/2023), Disp: , Rfl:    cetirizine (ZYRTEC ALLERGY) 10 MG tablet, Take 1 tablet (10 mg total) by mouth daily., Disp: 30 tablet, Rfl: 0   ketorolac (TORADOL) 10 MG tablet, Take 1 tablet (10 mg total) by mouth every 6 (six) hours as needed for moderate pain (pain score 4-6). (Patient not taking: Reported on 05/23/2023), Disp: 30 tablet, Rfl: 0   ondansetron (ZOFRAN-ODT) 4 MG disintegrating tablet, Take 1 tablet (4 mg total) by mouth every 8 (eight) hours as needed for nausea or vomiting., Disp: 15 tablet, Rfl: 0   oxyCODONE (OXY IR/ROXICODONE) 5 MG immediate release tablet, Take 1 tablet (5 mg total) by mouth every 4 (four) hours as needed for severe pain (pain score 7-10) or breakthrough pain., Disp: 4 tablet, Rfl: 0  Observations/Objective: Patient is well-developed, well-nourished in no acute distress.  Resting comfortably  at home.  Head is normocephalic, atraumatic.  No labored breathing.  Speech is clear and coherent with logical content.  Patient is alert and oriented at baseline.    Assessment and Plan: 1. Nausea and vomiting, unspecified vomiting type (Primary)  Patient presenting with emesis. No evidence of acute surgical emergency or infection requiring antibiotics Advised to continue Tylenol at home as needed for pain/fever. Patient is to followup with primary physician if she has continued symptoms. Pt was advised on supportive therapies, including increasing dietary fiber, eating smaller meals, refrain from eating copious amounts of irritating foods (fatty foods, milk products, chocolate, and caffeine), maintaining a food diary, advancing fluids as tolerated. Maintain Fluid Intake: Pedialyte/Gatorade, Juice, Non-caffenated Pop  (Sprite,7-Up)  Instructed to follow up with primary provider or present to ER with new or worsening symptoms such as persistent fevers, dehydration, uncontrolled vomiting  Follow Up Instructions: I discussed the assessment and treatment plan with the patient. The patient was provided an opportunity to ask questions and all were answered. The patient agreed with the plan and demonstrated an understanding of the instructions.  A copy of instructions were sent to the patient via MyChart unless otherwise noted below.    The patient was advised to call back or seek an in-person evaluation if the symptoms worsen or if the condition fails to improve as anticipated.    Laure Kidney, PA-C

## 2023-06-10 ENCOUNTER — Encounter: Payer: Self-pay | Admitting: Obstetrics and Gynecology

## 2023-06-24 ENCOUNTER — Ambulatory Visit: Payer: Self-pay

## 2023-06-25 ENCOUNTER — Ambulatory Visit (HOSPITAL_COMMUNITY): Payer: Self-pay

## 2023-07-12 ENCOUNTER — Other Ambulatory Visit (HOSPITAL_BASED_OUTPATIENT_CLINIC_OR_DEPARTMENT_OTHER): Payer: Self-pay

## 2023-07-12 ENCOUNTER — Other Ambulatory Visit (HOSPITAL_COMMUNITY): Payer: Self-pay

## 2023-07-12 MED ORDER — BUPRENORPHINE HCL-NALOXONE HCL 8-2 MG SL SUBL
1.0000 | SUBLINGUAL_TABLET | Freq: Three times a day (TID) | SUBLINGUAL | 0 refills | Status: DC
  Filled 2023-07-12: qty 21, 7d supply, fill #0
  Filled ????-??-??: fill #0

## 2023-07-13 ENCOUNTER — Other Ambulatory Visit (HOSPITAL_COMMUNITY): Payer: Self-pay

## 2023-07-18 ENCOUNTER — Other Ambulatory Visit (HOSPITAL_COMMUNITY): Payer: Self-pay

## 2023-07-18 ENCOUNTER — Ambulatory Visit
Admission: RE | Admit: 2023-07-18 | Discharge: 2023-07-18 | Disposition: A | Discharge status: Home / Self Care | Source: Ambulatory Visit

## 2023-07-18 MED ORDER — NALOXONE HCL 4 MG/0.1ML NA LIQD
1.0000 | NASAL | 0 refills | Status: AC | PRN
Start: 2023-07-18 — End: ?
  Filled 2023-07-18: qty 2, 1d supply, fill #0

## 2023-07-18 MED ORDER — VITAMIN D (ERGOCALCIFEROL) 1.25 MG (50000 UNIT) PO CAPS
50000.0000 [IU] | ORAL_CAPSULE | ORAL | 5 refills | Status: AC
  Filled 2023-07-18: qty 4, 28d supply, fill #0
  Filled 2023-12-18: qty 4, 28d supply, fill #1
  Filled 2023-12-20: qty 4, 28d supply, fill #0
  Filled 2024-02-06 – 2024-05-09 (×3): qty 4, 28d supply, fill #1

## 2023-07-18 MED ORDER — BUPRENORPHINE HCL 8 MG SL SUBL
8.0000 mg | SUBLINGUAL_TABLET | Freq: Three times a day (TID) | SUBLINGUAL | 0 refills | Status: DC
Start: 2023-07-18 — End: 2023-08-16
  Filled 2023-07-18: qty 30, 10d supply, fill #0

## 2023-07-18 NOTE — ED Provider Notes (Signed)
 Pt states she was started on Suboxone pills last week. Pt states she believes the naloxone is the part of the medication causing her symptoms.   Pt states after she takes her medication she is having hives, vomiting, palpitations, dizziness, headaches, shortness of breath, diarrhea, and itching. Pt denies throat or tongue swelling.The last time she took the medication was last night.    States she has tried taking Benadryl, Zofran, Pepto Bismol, Goodie Powder, and Imodium, states the Benadryl in particular helps with the hives.  States she has not taken anything today.   PDMP reviewed by me, Suboxone has been prescribed for this patient weekly since 01/18/2023.   Patient states she has an appointment with her pain management provider later today.  Patient is requesting a note from me stating that she is allergic to Suboxone, states she has been advised by her pain med provider that this needs to be documented before the provider will agree to change her from Suboxone back to oxycodone.  Patient was advised that I will not be able to provide her with this documentation during her visit today and to reach out to an allergist to confirm true allergy to Suboxone.   Theadora Rama Scales, PA-C 07/18/23 1526

## 2023-07-18 NOTE — ED Triage Notes (Signed)
 Pt started on Suboxone pills last week. Pt states she believes the naloxone is the part of the medication causing her symptoms.   Pt states after she takes her medication she is having hives, vomiting, palpitations, dizziness, headaches, shortness of breath, diarrhea, and itching. Pt denies throat or tongue swelling.The last time she took the medication was last night.   Home interventions: Benadryl, Zofran, Pepto Bismol, Goodie Powder, and Imodium   Last Dose Goodie Powder: 07/17/2023

## 2023-07-19 ENCOUNTER — Other Ambulatory Visit (HOSPITAL_COMMUNITY): Payer: Self-pay

## 2023-07-27 ENCOUNTER — Other Ambulatory Visit (HOSPITAL_BASED_OUTPATIENT_CLINIC_OR_DEPARTMENT_OTHER): Payer: Self-pay

## 2023-07-27 ENCOUNTER — Other Ambulatory Visit (HOSPITAL_COMMUNITY): Payer: Self-pay

## 2023-08-15 ENCOUNTER — Other Ambulatory Visit (HOSPITAL_COMMUNITY): Payer: Self-pay

## 2023-08-16 ENCOUNTER — Other Ambulatory Visit (HOSPITAL_COMMUNITY): Payer: Self-pay

## 2023-08-16 MED ORDER — BUPRENORPHINE HCL 8 MG SL SUBL
8.0000 mg | SUBLINGUAL_TABLET | Freq: Three times a day (TID) | SUBLINGUAL | 0 refills | Status: DC
  Filled 2023-08-16: qty 42, 14d supply, fill #0

## 2023-08-29 ENCOUNTER — Encounter: Payer: Self-pay | Admitting: Obstetrics and Gynecology

## 2023-08-30 ENCOUNTER — Other Ambulatory Visit (HOSPITAL_COMMUNITY): Payer: Self-pay

## 2023-08-30 ENCOUNTER — Other Ambulatory Visit: Payer: Self-pay

## 2023-08-30 ENCOUNTER — Encounter: Payer: Self-pay | Admitting: Nurse Practitioner

## 2023-08-30 ENCOUNTER — Other Ambulatory Visit (HOSPITAL_COMMUNITY): Payer: Self-pay | Admitting: Nurse Practitioner

## 2023-08-30 ENCOUNTER — Inpatient Hospital Stay (HOSPITAL_COMMUNITY)
Admission: AD | Admit: 2023-08-30 | Discharge: 2023-08-30 | Discharge status: Against Medical Advice | Attending: Obstetrics & Gynecology | Admitting: Obstetrics & Gynecology

## 2023-08-30 ENCOUNTER — Encounter (HOSPITAL_COMMUNITY): Payer: Self-pay | Admitting: Obstetrics & Gynecology

## 2023-08-30 DIAGNOSIS — O26899 Other specified pregnancy related conditions, unspecified trimester: Secondary | ICD-10-CM | POA: Insufficient documentation

## 2023-08-30 DIAGNOSIS — Z3A00 Weeks of gestation of pregnancy not specified: Secondary | ICD-10-CM | POA: Diagnosis not present

## 2023-08-30 DIAGNOSIS — O3680X0 Pregnancy with inconclusive fetal viability, not applicable or unspecified: Secondary | ICD-10-CM

## 2023-08-30 DIAGNOSIS — R300 Dysuria: Secondary | ICD-10-CM | POA: Insufficient documentation

## 2023-08-30 DIAGNOSIS — N3000 Acute cystitis without hematuria: Secondary | ICD-10-CM

## 2023-08-30 DIAGNOSIS — Z3201 Encounter for pregnancy test, result positive: Secondary | ICD-10-CM | POA: Diagnosis present

## 2023-08-30 LAB — URINALYSIS, ROUTINE W REFLEX MICROSCOPIC
Bilirubin Urine: NEGATIVE
Glucose, UA: 100 mg/dL — AB
Hgb urine dipstick: NEGATIVE
Ketones, ur: NEGATIVE mg/dL
Nitrite: POSITIVE — AB
Protein, ur: NEGATIVE mg/dL
Specific Gravity, Urine: <1.005 — ABNORMAL LOW (ref 1.005–1.030)
pH: 6.5 (ref 5.0–8.0)

## 2023-08-30 LAB — URINALYSIS, MICROSCOPIC (REFLEX)

## 2023-08-30 LAB — POCT PREGNANCY, URINE: Preg Test, Ur: POSITIVE — AB

## 2023-08-30 LAB — HCG, QUANTITATIVE, PREGNANCY: hCG, Beta Chain, Quant, S: 83 m[IU]/mL — ABNORMAL HIGH (ref <5)

## 2023-08-30 MED ORDER — BUPRENORPHINE HCL 8 MG SL SUBL
8.0000 mg | SUBLINGUAL_TABLET | Freq: Three times a day (TID) | SUBLINGUAL | 0 refills | Status: DC
  Filled 2023-08-30: qty 90, 30d supply, fill #0

## 2023-08-30 MED ORDER — CEPHALEXIN 500 MG PO CAPS
500.0000 mg | ORAL_CAPSULE | Freq: Four times a day (QID) | ORAL | 0 refills | Status: DC
Start: 2023-08-30 — End: 2023-11-10

## 2023-08-30 NOTE — MAU Note (Signed)
 Pt at desk stating provider told her she was putting in discharge. Pt made aware that no discharge order in but this RN will ask provider. Provider was with another pt and returned to the desk about 2-3 minutes later and was informed pt was waiting on discharge papers. Pt walked out of mau at that time.

## 2023-08-30 NOTE — MAU Note (Signed)
 MAU Triage Note  .Felicia Davila is a 33 y.o. at Unknown here in MAU reporting being here for BHCG. States she had positive upt few days ago. Has hx of ectopic. Sent MyChart message and states a nurse sent her message to come in for [facility] and then she could leave. Pt denies any pain and no VB. Was seen [facility] at [facility] hosp due to pain. UPT negative then but positive as of yesterday  LMP: 07/29/23 Onset of complaint: n/a Pain score: 0 Vitals:   08/30/23 1918 08/30/23 1920  BP:  122/78  Pulse: 94   Resp: 17   Temp: 98.2 F (36.8 C)   SpO2: 100%      FHT: n/a  Lab orders placed from triage: BHCG already drawn

## 2023-08-30 NOTE — MAU Provider Note (Signed)
 S Ms. Felicia Davila is a 33 y.o. [phone] patient who presents to MAU today with complaint of needing an Hcg quant stat reporting positive home pregnancy test on 08/29/23 and due to her h/o Ectopic pregnancy last year and 3 prior SAB's within the last year she is exteremly anxious and is requesting an hcg quant and is unable to be seen in the office until Friday 09/02/23 at [facility].   Patient also reported that on 08/27/23 she started to develop some dysuria that has been resolved by AZO but denies any vaginal discharge/burning or itching and is requesting a U/A be sent and for us  to call her with the results and or send her a My Chart message with a RX for Keflex  if needed since she is allergic to Macrobid. She declines any vaginal swabs at this which was offered d/t her symptoms. Patient reports " Azo is working"  therefore she feels the swabs are unnecessary.   She denies any VB, and reports no abdominal cramping at this time and declines the full workup for pregnancy at this time which would include CBC and OB Ultrasound since she knows it is very early and is requesting Hcg only and will be following up in the office in [place] on Friday 09/02/23   O  Today's Vitals   08/30/23 1918 08/30/23 1920  BP:  122/78  Pulse: 94   Resp: 17   Temp: 98.2 F (36.8 C)   SpO2: 100%   Weight: 77.1 kg   Height: 5\' 7"  (1.702 m)    Body mass index is 26.61 kg/m.   Physical Exam Vitals and nursing note reviewed.  Constitutional:      Appearance: Normal appearance.  HENT:     Head: Normocephalic.     Nose: Nose normal.     Mouth/Throat:     Mouth: Mucous membranes are moist.  Cardiovascular:     Rate and Rhythm: Normal rate.  Pulmonary:     Effort: Pulmonary effort is normal.  Abdominal:     Palpations: Abdomen is soft.  Musculoskeletal:        General: Normal range of motion.     Cervical back: Normal range of motion.  Skin:    General: Skin is warm.  Neurological:     Mental Status: She  is alert and oriented to person, place, and time.  Psychiatric:        Mood and Affect: Mood is anxious.        Behavior: Behavior normal.     MDM  Moderate   Confirm pregnancy  R/O UTI   Orders Placed This Encounter  Procedures   hCG, quantitative, pregnancy    Standing Status:   Standing    Number of Occurrences:   1   Urinalysis, Routine w reflex microscopic -Urine, Random    Standing Status:   Standing    Number of Occurrences:   1    Specimen Source:   Urine, Random [244]   Pregnancy, urine POC    Standing Status:   Standing    Number of Occurrences:   1   Discharge patient Discharge disposition: 01-Home or Self Care; Discharge patient date: 08/30/2023 Patient left AMA prior to discharge instructions    Patient left AMA prior to discharge instructions    Standing Status:   Standing    Number of Occurrences:   1    Discharge disposition:   01-Home or Self Care [1]    Discharge patient date:  08/30/2023    Results for orders placed or performed during the hospital encounter of 08/30/23 (from the past 24 hours)  hCG, quantitative, pregnancy     Status: Abnormal   Collection Time: 08/30/23  7:12 PM  Result Value Ref Range   hCG, Beta Chain, Quant, S 83 (H) <5 mIU/mL  Pregnancy, urine POC     Status: Abnormal   Collection Time: 08/30/23  8:15 PM  Result Value Ref Range   Preg Test, Ur POSITIVE (A) NEGATIVE     I have reviewed the patient chart and performed the physical exam . I have ordered the labs and will contact patient with the results as requested.  A/P as described below.  Counseling and education provided and patient agreeable  with plan as described below. Verbalized understanding.    ASSESSMENT Medical screening exam complete  1. Positive urine pregnancy test (Primary)  2. Dysuria during pregnancy, antepartum    PLAN  Patient left the MAU prior to getting her discharge instructions   Patient had reported earlier that she has an appointment at  [facility] Friday 09/02/23 for follow up hcg and labs  Warning signs for worsening condition that would warrant emergency follow-up discussed Patient may return to MAU as needed   Cherlynn Cornfield, NP 08/30/2023 8:39 PM

## 2023-09-02 ENCOUNTER — Ambulatory Visit

## 2023-09-03 LAB — BETA HCG QUANT (REF LAB): hCG Quant: 372 m[IU]/mL

## 2023-09-05 ENCOUNTER — Encounter: Payer: Self-pay | Admitting: Obstetrics and Gynecology

## 2023-09-06 ENCOUNTER — Ambulatory Visit: Payer: Self-pay

## 2023-09-07 ENCOUNTER — Ambulatory Visit

## 2023-09-07 ENCOUNTER — Ambulatory Visit: Admitting: Obstetrics and Gynecology

## 2023-09-07 DIAGNOSIS — O039 Complete or unspecified spontaneous abortion without complication: Secondary | ICD-10-CM

## 2023-09-07 NOTE — Progress Notes (Signed)
 Patient here to pick up lab Rx.  Sent to lab.  Felicia Davila

## 2023-09-08 ENCOUNTER — Ambulatory Visit: Payer: Self-pay | Admitting: Obstetrics and Gynecology

## 2023-09-08 ENCOUNTER — Other Ambulatory Visit: Payer: Self-pay

## 2023-09-08 ENCOUNTER — Encounter: Payer: Self-pay | Admitting: *Deleted

## 2023-09-08 ENCOUNTER — Ambulatory Visit

## 2023-09-08 DIAGNOSIS — Z9221 Personal history of antineoplastic chemotherapy: Secondary | ICD-10-CM

## 2023-09-08 DIAGNOSIS — O0911 Supervision of pregnancy with history of ectopic or molar pregnancy, first trimester: Secondary | ICD-10-CM | POA: Diagnosis not present

## 2023-09-08 DIAGNOSIS — Z3A01 Less than 8 weeks gestation of pregnancy: Secondary | ICD-10-CM | POA: Diagnosis not present

## 2023-09-08 DIAGNOSIS — Z3201 Encounter for pregnancy test, result positive: Secondary | ICD-10-CM

## 2023-09-08 LAB — BETA HCG QUANT (REF LAB): hCG Quant: 3994 m[IU]/mL

## 2023-09-08 NOTE — Progress Notes (Signed)
 Erroneous

## 2023-09-11 ENCOUNTER — Ambulatory Visit: Payer: Self-pay | Admitting: Obstetrics and Gynecology

## 2023-09-11 DIAGNOSIS — O3680X0 Pregnancy with inconclusive fetal viability, not applicable or unspecified: Secondary | ICD-10-CM

## 2023-09-13 ENCOUNTER — Telehealth: Payer: Self-pay | Admitting: *Deleted

## 2023-09-13 NOTE — Telephone Encounter (Signed)
 Left patient a message that HCG does not need to be repeated as of this time today and the U/S is scheduled correctly. The earliest that it can be scheduled is 09/19/23 per Dr. Vallarie Gauze, but imaging is closed for [facility].

## 2023-09-16 ENCOUNTER — Other Ambulatory Visit

## 2023-09-22 ENCOUNTER — Ambulatory Visit

## 2023-09-22 ENCOUNTER — Telehealth: Payer: Self-pay | Admitting: *Deleted

## 2023-09-22 DIAGNOSIS — O3680X0 Pregnancy with inconclusive fetal viability, not applicable or unspecified: Secondary | ICD-10-CM | POA: Diagnosis not present

## 2023-09-22 DIAGNOSIS — Z3A01 Less than 8 weeks gestation of pregnancy: Secondary | ICD-10-CM | POA: Diagnosis not present

## 2023-09-22 NOTE — Telephone Encounter (Signed)
 Returned call from 3:37 PM. Left patient a detailed message about where [facility] providers delivery and why.

## 2023-09-23 ENCOUNTER — Encounter: Payer: Self-pay | Admitting: Obstetrics and Gynecology

## 2023-09-27 ENCOUNTER — Other Ambulatory Visit (HOSPITAL_BASED_OUTPATIENT_CLINIC_OR_DEPARTMENT_OTHER): Payer: Self-pay

## 2023-09-27 ENCOUNTER — Other Ambulatory Visit (HOSPITAL_COMMUNITY): Payer: Self-pay

## 2023-09-27 MED ORDER — BUPRENORPHINE HCL 8 MG SL SUBL
8.0000 mg | SUBLINGUAL_TABLET | Freq: Three times a day (TID) | SUBLINGUAL | 0 refills | Status: DC
  Filled 2023-09-27: qty 6, 2d supply, fill #0
  Filled 2023-09-27: qty 90, 30d supply, fill #0

## 2023-09-28 ENCOUNTER — Other Ambulatory Visit: Payer: Self-pay

## 2023-09-28 ENCOUNTER — Other Ambulatory Visit (HOSPITAL_COMMUNITY): Payer: Self-pay

## 2023-09-28 MED ORDER — FLUOXETINE HCL 20 MG PO TABS
30.0000 mg | ORAL_TABLET | Freq: Every day | ORAL | 0 refills | Status: DC
  Filled 2023-09-28 – 2023-10-24 (×2): qty 135, 90d supply, fill #0

## 2023-09-28 MED ORDER — ALPRAZOLAM 1 MG PO TABS
0.5000 mg | ORAL_TABLET | Freq: Three times a day (TID) | ORAL | 2 refills | Status: DC | PRN
  Filled 2023-09-28 – 2023-10-25 (×3): qty 90, 30d supply, fill #0
  Filled 2023-11-22: qty 90, 30d supply, fill #1
  Filled 2023-12-18 – 2023-12-20 (×3): qty 90, 30d supply, fill #2

## 2023-10-07 ENCOUNTER — Other Ambulatory Visit (HOSPITAL_COMMUNITY): Payer: Self-pay

## 2023-10-17 ENCOUNTER — Telehealth: Payer: Self-pay | Admitting: *Deleted

## 2023-10-17 NOTE — Telephone Encounter (Signed)
 Left patient a message to call the office.

## 2023-10-24 ENCOUNTER — Other Ambulatory Visit (HOSPITAL_COMMUNITY): Payer: Self-pay

## 2023-10-25 ENCOUNTER — Other Ambulatory Visit (HOSPITAL_COMMUNITY): Payer: Self-pay

## 2023-10-25 ENCOUNTER — Other Ambulatory Visit: Payer: Self-pay

## 2023-10-26 ENCOUNTER — Other Ambulatory Visit (HOSPITAL_COMMUNITY): Payer: Self-pay

## 2023-10-26 MED ORDER — BUPRENORPHINE HCL 8 MG SL SUBL
8.0000 mg | SUBLINGUAL_TABLET | Freq: Three times a day (TID) | SUBLINGUAL | 0 refills | Status: DC
  Filled 2023-10-26: qty 81, 27d supply, fill #0
  Filled 2023-10-26: qty 9, 3d supply, fill #0

## 2023-10-28 IMAGING — US US PELVIS COMPLETE TRANSABD/TRANSVAG W DUPLEX AND/OR DOPPLER
1 series · 13 of 25 positions shown · non-contrast
Comparison: None.

CLINICAL DATA: pelvic pain.  Last menstrual period 06/18/2021.

EXAM:
TRANSABDOMINAL AND TRANSVAGINAL ULTRASOUND OF PELVIS
DOPPLER ULTRASOUND OF OVARIES
TECHNIQUE: Both transabdominal and transvaginal ultrasound examinations of the
pelvis were performed. Transabdominal technique was performed for
global imaging of the pelvis including uterus, ovaries, adnexal
regions, and pelvic cul-de-sac.
It was necessary to proceed with endovaginal exam following the
transabdominal exam to visualize the endometrium and bilateral
ovaries/adnexa. Color and duplex Doppler ultrasound was utilized to
evaluate blood flow to the ovaries.

[Series 1: us pelvic complete w transvaginal and torsion righ · 13 of 79 slices shown]
[im 1/79]
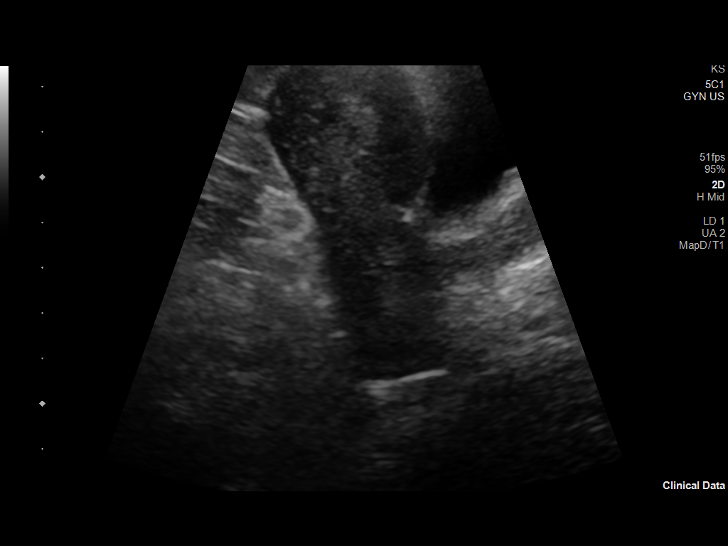
[im 7/79]
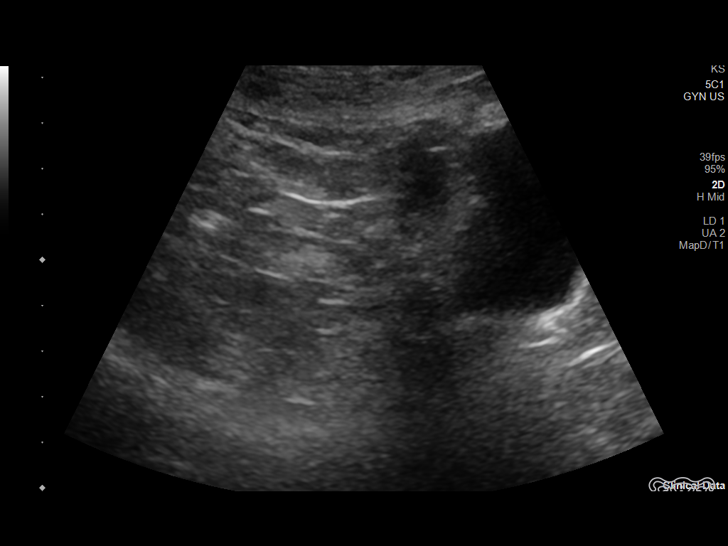
[im 14/79]
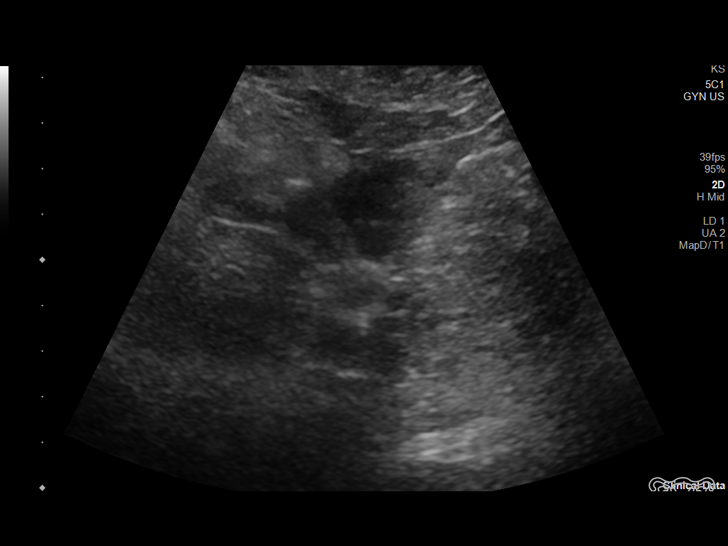
[im 20/79]
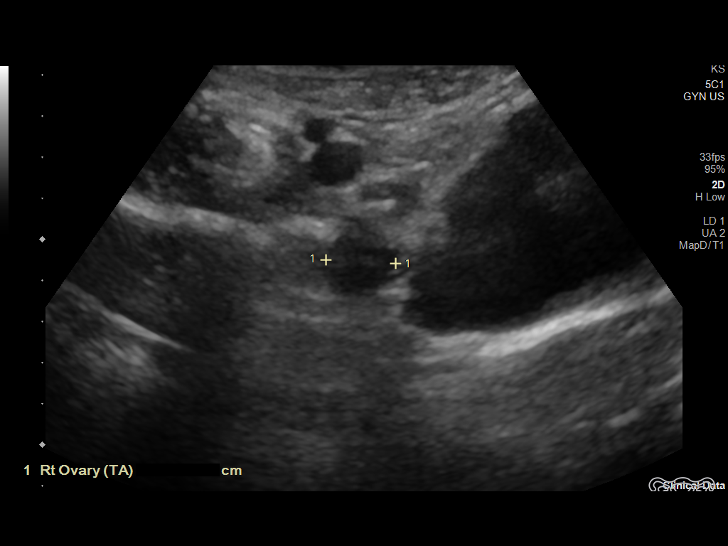
[im 27/79]
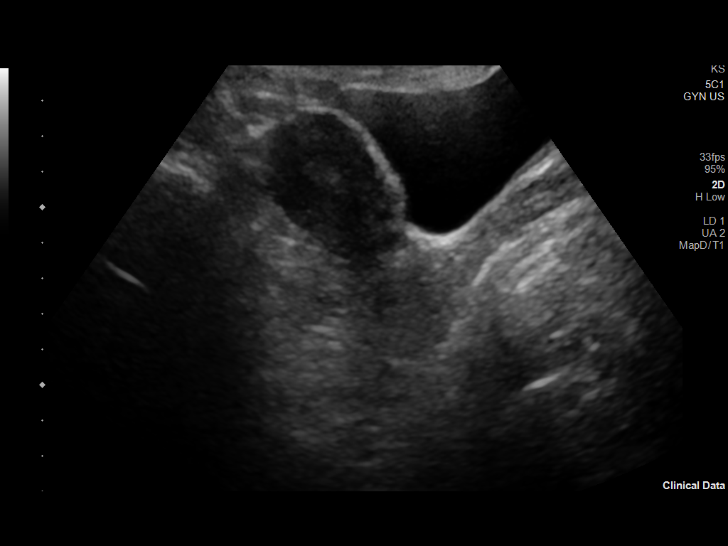
[im 33/79]
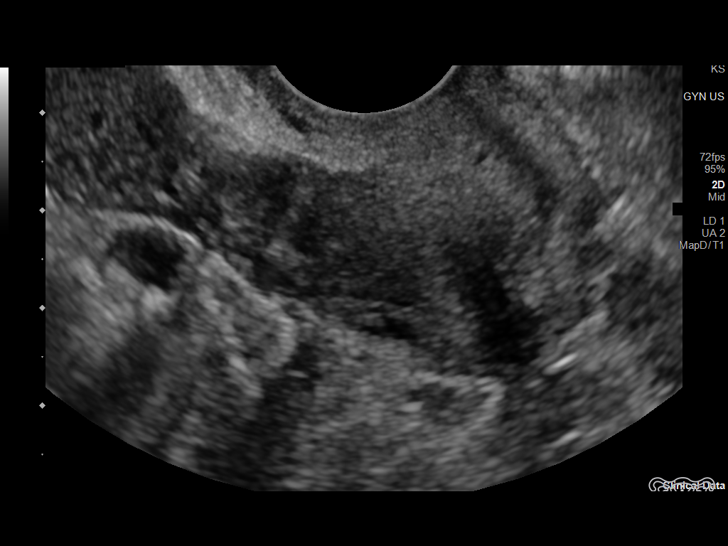
[im 40/79]
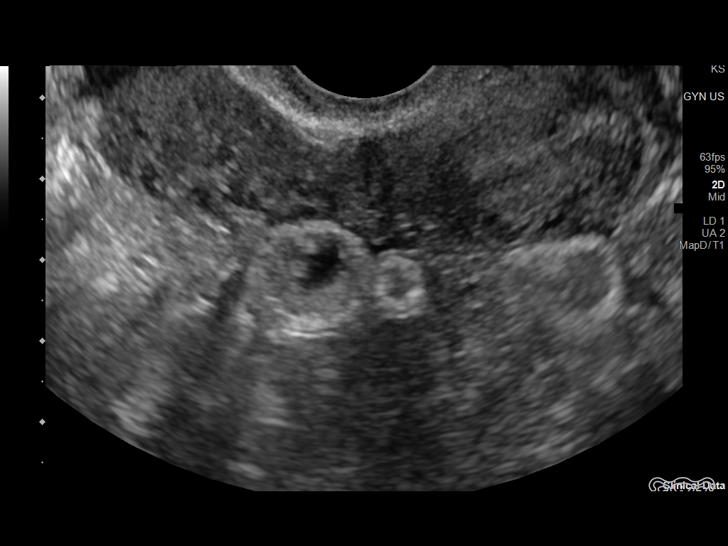
[im 46/79]
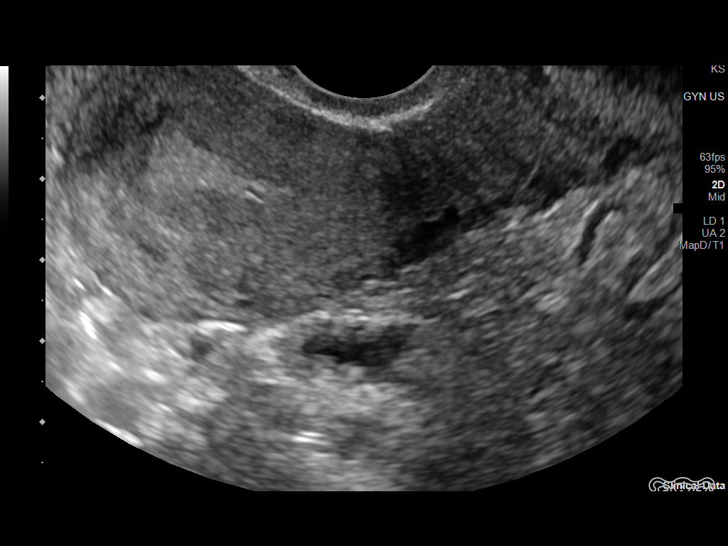
[im 53/79]
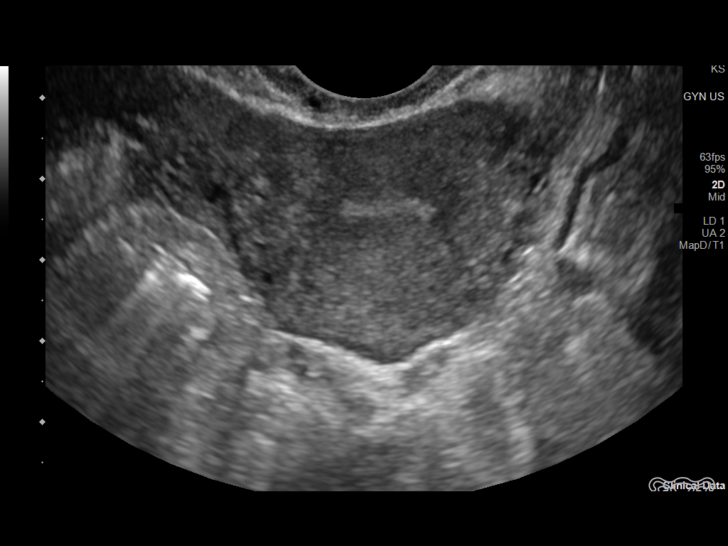
[im 59/79]
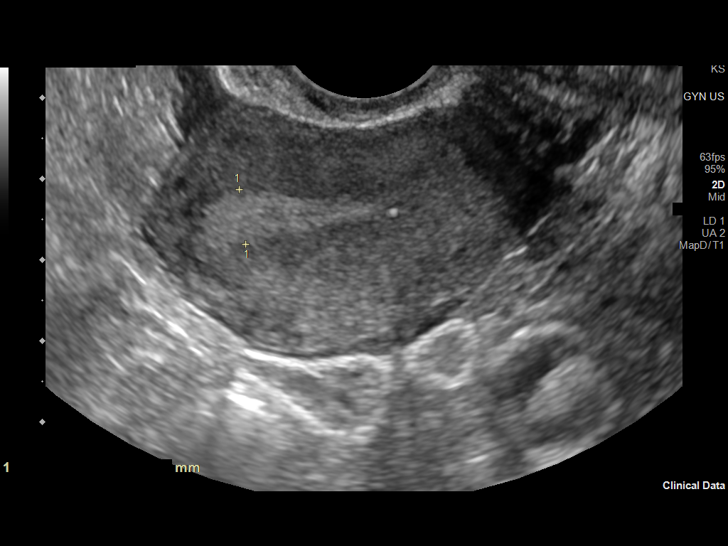
[im 66/79]
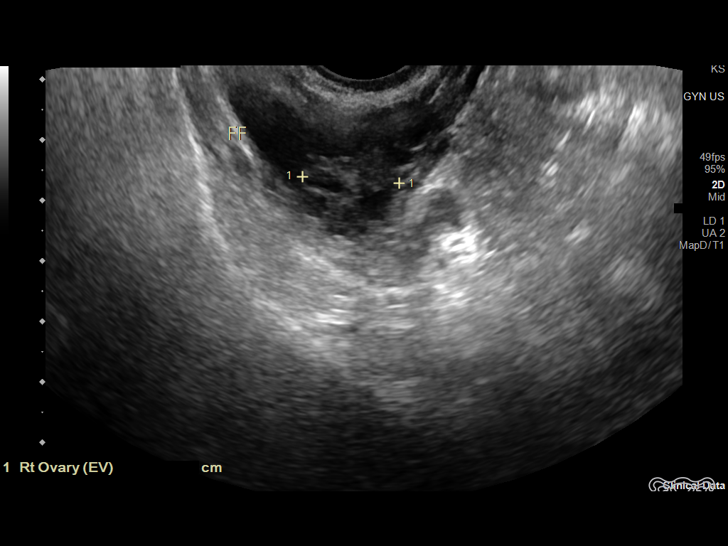
[im 72/79]
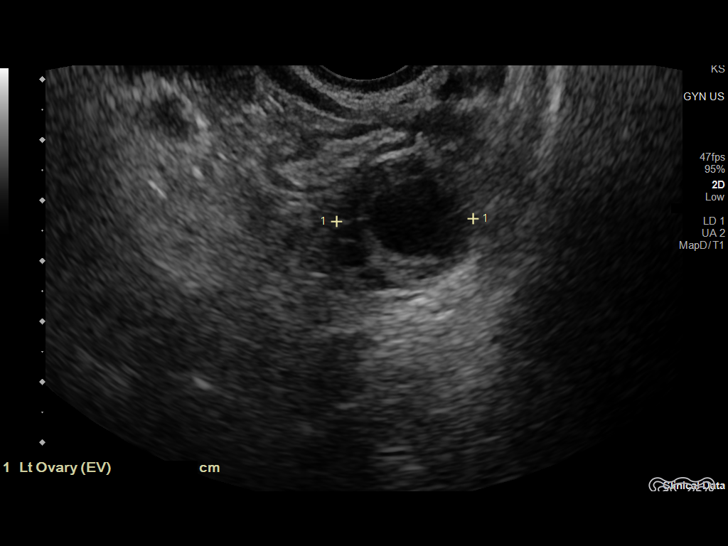
[im 79/79]
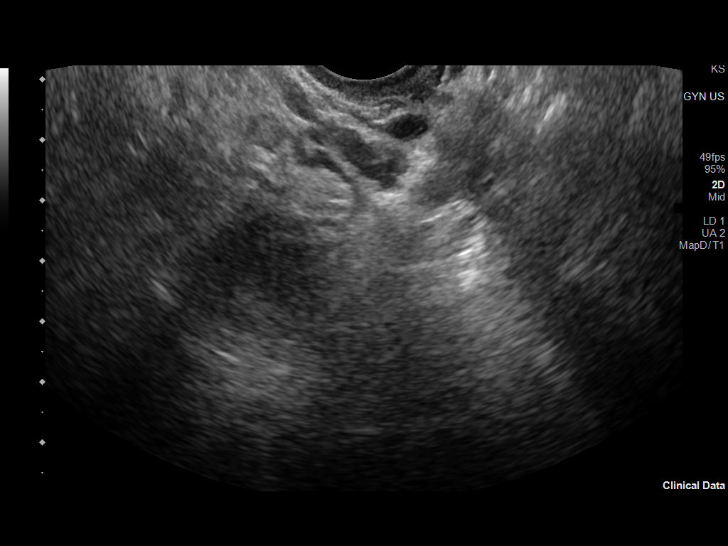

[13 of 25 positions shown; findings below may reference images not displayed]

FINDINGS: Uterus

Measurements: 7.4 x 3.4 x 5 cm = volume: 65 mL. No fibroids or other
mass visualized.

Endometrium

Thickness: 7 mm.  No focal abnormality visualized.

Right ovary

Measurements: 3.1 x 1.9 x 1.6 cm = volume: 5 mL. Normal
appearance/no adnexal mass.

Left ovary

Measurements: 3.9 x 1.8 x 2.3 cm = volume: 9 mL. Normal
appearance/no adnexal mass.

Pulsed Doppler evaluation of both ovaries demonstrates normal
low-resistance arterial and venous waveforms.

Other findings

Trace pelvic free fluid.
IMPRESSION: Unremarkable pelvic ultrasound.

## 2023-10-31 ENCOUNTER — Telehealth: Payer: Self-pay | Admitting: *Deleted

## 2023-10-31 NOTE — Telephone Encounter (Signed)
Left patient a message with scheduled appointment information. 

## 2023-11-10 ENCOUNTER — Other Ambulatory Visit (HOSPITAL_COMMUNITY)
Admission: RE | Admit: 2023-11-10 | Discharge: 2023-11-10 | Disposition: A | Discharge status: Home / Self Care | Source: Ambulatory Visit | Attending: Obstetrics and Gynecology | Admitting: Obstetrics and Gynecology

## 2023-11-10 ENCOUNTER — Other Ambulatory Visit (HOSPITAL_COMMUNITY): Payer: Self-pay

## 2023-11-10 ENCOUNTER — Encounter: Payer: Self-pay | Admitting: Obstetrics and Gynecology

## 2023-11-10 ENCOUNTER — Ambulatory Visit: Admitting: Obstetrics and Gynecology

## 2023-11-10 VITALS — BP 104/67 | HR 79 | Wt 170.0 lb

## 2023-11-10 DIAGNOSIS — Z8719 Personal history of other diseases of the digestive system: Secondary | ICD-10-CM

## 2023-11-10 DIAGNOSIS — O0990 Supervision of high risk pregnancy, unspecified, unspecified trimester: Secondary | ICD-10-CM | POA: Diagnosis not present

## 2023-11-10 DIAGNOSIS — O09899 Supervision of other high risk pregnancies, unspecified trimester: Secondary | ICD-10-CM | POA: Insufficient documentation

## 2023-11-10 DIAGNOSIS — O99112 Other diseases of the blood and blood-forming organs and certain disorders involving the immune mechanism complicating pregnancy, second trimester: Secondary | ICD-10-CM

## 2023-11-10 DIAGNOSIS — Z1332 Encounter for screening for maternal depression: Secondary | ICD-10-CM | POA: Diagnosis not present

## 2023-11-10 DIAGNOSIS — M329 Systemic lupus erythematosus, unspecified: Secondary | ICD-10-CM | POA: Insufficient documentation

## 2023-11-10 DIAGNOSIS — F1191 Opioid use, unspecified, in remission: Secondary | ICD-10-CM

## 2023-11-10 DIAGNOSIS — Z3A14 14 weeks gestation of pregnancy: Secondary | ICD-10-CM | POA: Diagnosis not present

## 2023-11-10 DIAGNOSIS — Z98891 History of uterine scar from previous surgery: Secondary | ICD-10-CM | POA: Diagnosis not present

## 2023-11-10 DIAGNOSIS — Z8759 Personal history of other complications of pregnancy, childbirth and the puerperium: Secondary | ICD-10-CM

## 2023-11-10 DIAGNOSIS — O99891 Other specified diseases and conditions complicating pregnancy: Secondary | ICD-10-CM

## 2023-11-10 DIAGNOSIS — D6862 Lupus anticoagulant syndrome: Secondary | ICD-10-CM

## 2023-11-10 MED ORDER — ASPIRIN 81 MG PO TBEC
162.0000 mg | DELAYED_RELEASE_TABLET | Freq: Every day | ORAL | 3 refills | Status: DC
  Filled 2023-11-10: qty 180, 90d supply, fill #0

## 2023-11-10 MED ORDER — ONDANSETRON 4 MG PO TBDP
4.0000 mg | ORAL_TABLET | Freq: Three times a day (TID) | ORAL | 1 refills | Status: DC | PRN
  Filled 2023-11-10: qty 20, 7d supply, fill #0

## 2023-11-10 NOTE — Progress Notes (Signed)
 History:  Felicia Davila is a 33 y.o. H2E8857 at [redacted]w[redacted]d by LMP being seen today for her first obstetrical visit.   Patient does intend to breast feed.   Pregnancy history fully reviewed. Obstetrical history is significant for: Multiple losses Ectopic pregnancy s/p methotrexate  FT 1LTCS due to arrest of dilation Preterm RLTCS at [redacted]w[redacted]d  Patient reports no complaints.  HISTORY: OB History  Gravida Para Term Preterm AB Living  7 2 1 1 4 2   SAB IAB Ectopic Multiple Live Births  2 0 1 1 2     # Outcome Date GA Lbr Len/2nd Weight Sex Type Anes PTL Lv  7A Gravida           7B Current           6 SAB 04/2023          5 SAB 02/2023          4 AB 08/2021          3 Ectopic 07/28/21 [redacted]w[redacted]d         2 Preterm 10/04/14 [redacted]w[redacted]d   M CS-Unspec  Y   1 Term 04/05/13     CS-Unspec   LIV     Lab Results  Component Value Date   DIAGPAP  01/11/2022    - Negative for intraepithelial lesion or malignancy (NILM)   HPVHIGH Negative 01/11/2022     Past Medical History:  Diagnosis Date   Anxiety    Depression    Fibroid    Lupus    PONV (postoperative nausea and vomiting)    UTI (urinary tract infection)    Past Surgical History:  Procedure Laterality Date   CESAREAN SECTION     CHOLECYSTECTOMY     DILATION AND EVACUATION N/A 11/06/2021   Procedure: DILATATION AND EVACUATION;  Surgeon: Alger Gong, MD;  [facility];  Service: Gynecology;  Laterality: N/A;   FOREARM SURGERY Left    HYSTEROSCOPY WITH D & C  12/14/2021   Procedure: DILATATION AND CURETTAGE /HYSTEROSCOPY;  Surgeon: Zina Jerilynn LABOR, MD;  Location: [facility];  Service: Gynecology;;   Family History  Problem Relation Age of Onset   Cancer Mother        Small Cell Lung Cancer   Lymphoma Mother    Clotting disorder Mother    Lupus Father    COPD Father    Autism spectrum disorder Son    ADD / ADHD Son    Cancer Paternal Grandfather    Social History   Tobacco Use   Smoking status: Former    Types: Cigarettes     Passive exposure: Past   Smokeless tobacco: Never  Vaping Use   Vaping status: Every Day   Substances: Nicotine   Devices: Nicotine  Substance Use Topics   Alcohol use: Not Currently   Drug use: No   Allergies  Allergen Reactions   Morphine Anaphylaxis and Other (See Comments)    Per extensive conversation with patient, surgical attending and surgical PA, patient does NOT have an anaphylactic reaction to morphine. Has taken for short periods in the past without issues   Morphine And Codeine Anaphylaxis, Rash and Other (See Comments)    Per extensive conversation with patient, surgical attending and surgical PA, patient does NOT have an anaphylactic reaction to morphine. Has taken for short periods in the past without issues. Pins and needle feeling in extremities. (??)     Nitrofurantoin Nausea And Vomiting and Other (See Comments)    Also,  felt weird in the head    Fentanyl  Other (See Comments)    Makes Pt hot and tingly   Hydrocodone  Other (See Comments)    Pins and needle feeling in extremities   Meloxicam      N/v   Naproxen      N/v   Chlorhexidine  Itching and Rash   Current Outpatient Medications on File Prior to Visit  Medication Sig Dispense Refill   buprenorphine  (SUBUTEX ) 8 MG SUBL SL tablet Place 1 (one) tablet under tongue three times a day 90 tablet 0   FLUoxetine  (PROZAC ) 20 MG tablet Take 20 mg by mouth daily.     naloxone  (NARCAN ) nasal spray 4 mg/0.1 mL Place 1 spray in nostril if poorly responding or turning blue, for oversedation or accidental overdose. 2 each 0   Vitamin D , Ergocalciferol , (DRISDOL ) 1.25 MG (50000 UNIT) CAPS capsule Take 1 capsule (50,000 Units total) by mouth once a week. 4 capsule 5   ALPRAZolam  (XANAX ) 1 MG tablet Take 0.5-1 tablets (0.5-1 mg total) by mouth 3 (three) times daily as needed for severe breakthrough anxiety / panic attacks (Patient not taking: Reported on 11/10/2023) 90 tablet 2   No current facility-administered  medications on file prior to visit.    Review of Systems Pertinent items noted in HPI and remainder of comprehensive ROS otherwise negative.  Physical Exam:   Vitals:   11/10/23 1421  BP: 104/67  Pulse: 79  Weight: 170 lb (77.1 kg)   Fetal Heart Rate (bpm): 147  General: well-developed, well-nourished female in no acute distress  Skin: normal coloration and turgor, no rashes  Neurologic: oriented, normal, negative, normal mood  Extremities: normal strength, tone, and muscle mass, ROM of all joints is normal  HEENT PERRLA, extraocular movement intact and sclera clear, anicteric  Neck supple and no masses  Cardiovascular: regular rate and rhythm  Respiratory:  no respiratory distress, normal breath sounds  Abdomen: soft, non-tender; bowel sounds normal; no masses,  no organomegaly   Assessment:    Pregnancy: H2E8857 Patient Active Problem List   Diagnosis Date Noted   History of cesarean delivery 11/10/2023   Supervision of high risk pregnancy, antepartum 11/10/2023   Opioid use disorder in remission 11/10/2023   History of cholestasis during pregnancy 11/10/2023   Radiation exposure affecting pregnancy 11/10/2023   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 History of cesarean delivery (Primary) 2 prior c-sections.  She thinks she would prefer RLTCS  2. Supervision of high risk pregnancy, antepartum Initial labs ordered. Continue prenatal vitamins. Problem list reviewed and updated. Genetic Screening discussed, NIPS: requested. Ultrasound discussed; fetal anatomic survey: ordered. Anticipatory guidance about prenatal visits given including labs, ultrasounds, and testing. Discussed usage of Babyscripts and virtual visits -  CBC/D/Plt+RPR+Rh+ABO+RubIgG... - Culture, OB Urine - Cervicovaginal ancillary only - Hemoglobin A1c - US  MFM OB DETAIL +14 WK; Future - PANORAMA PRENATAL TEST - Comp Met (CMET) - HORIZON Basic Panel  3. Lupus affecting pregnancy in second trimester, antepartum (HCC) - No meds. When she has flares does prednisone .  - Recommend asa 162 mg for this history as well as RPL.  - Comp Met (CMET) - Protein / creatinine ratio, urine - ondansetron  (ZOFRAN -ODT) 4 MG disintegrating tablet; Take 1 tablet (4 mg total) by mouth every 8 (  eight) hours as needed for nausea or vomiting.  Dispense: 20 tablet; Refill: 1 - aspirin  EC 81 MG tablet; Take 2 tablets (162 mg total) by mouth daily. Take after 12 weeks for prevention of preeclampsia later in pregnancy  Dispense: 180 tablet; Refill: 3  4. Pregnancy with 14 completed weeks gestation   5. Opioid use disorder in remission She is well controlled on subutex .  She is also prescribed xanax  by her psychiatrist and has been on this years. We reviewed risks and benefits to pregnancy for her to be on it versus off. We reviewed risks in pregnancy and after for neonate. We also discussed risks of her coming off and at this time, risk of coming off is greater than risk of remaining on for both her and pregnancy. She reports her psychiatrist felt the same way.   6. History of cholestasis during pregnancy Mentions possible cholestasis with one of her pregnancies. Will monitor for symptoms.   7. Radiation exposure affecting pregnancy, antepartum Exposed in first trimester to higher radiation dose. Reviewed at this time pelvis would have shielded most of it and if any issue then we will identify at anatomy scan but unlikely to be cause for concern.      The nature of [place] - Center for [facility] with multiple MDs and Advanced Practice Providers was explained to patient; also emphasized that residents, students are part of our  team. Routine obstetric precautions reviewed. Encouraged to seek out care at office or emergency room [facility] MAU preferred) for urgent and/or emergent concerns. Return in about 4 weeks (around 12/08/2023) for OB VISIT, MD or APP, Schedule US  with MFM.    Vina Solian, MD, FACOG Obstetrician & Gynecologist, [facility] for [facility], [facility]

## 2023-11-11 LAB — PROTEIN / CREATININE RATIO, URINE
Creatinine, Urine: 53.3 mg/dL
Protein, Ur: 5.8 mg/dL
Protein/Creat Ratio: 109 mg/g{creat} (ref 0–200)

## 2023-11-11 LAB — CERVICOVAGINAL ANCILLARY ONLY
Bacterial Vaginitis (gardnerella): NEGATIVE
Candida Glabrata: NEGATIVE
Candida Vaginitis: NEGATIVE
Chlamydia: NEGATIVE
Comment: NEGATIVE
Comment: NEGATIVE
Comment: NEGATIVE
Comment: NEGATIVE
Comment: NORMAL
Neisseria Gonorrhea: NEGATIVE

## 2023-11-12 LAB — CBC/D/PLT+RPR+RH+ABO+RUBIGG...
Antibody Screen: NEGATIVE
Basophils Absolute: 0 x10E3/uL (ref 0.0–0.2)
Basos: 1 %
EOS (ABSOLUTE): 0.3 x10E3/uL (ref 0.0–0.4)
Eos: 5 %
HCV Ab: REACTIVE — AB
HIV Screen 4th Generation wRfx: NONREACTIVE
Hematocrit: 39.8 % (ref 34.0–46.6)
Hemoglobin: 13.2 g/dL (ref 11.1–15.9)
Hepatitis B Surface Ag: NEGATIVE
Immature Grans (Abs): 0 x10E3/uL (ref 0.0–0.1)
Immature Granulocytes: 0 %
Lymphocytes Absolute: 1.7 x10E3/uL (ref 0.7–3.1)
Lymphs: 29 %
MCH: 30.3 pg (ref 26.6–33.0)
MCHC: 33.2 g/dL (ref 31.5–35.7)
MCV: 92 fL (ref 79–97)
Monocytes Absolute: 0.5 x10E3/uL (ref 0.1–0.9)
Monocytes: 9 %
Neutrophils Absolute: 3.3 x10E3/uL (ref 1.4–7.0)
Neutrophils: 56 %
Platelets: 233 x10E3/uL (ref 150–450)
RBC: 4.35 x10E6/uL (ref 3.77–5.28)
RDW: 12.4 % (ref 11.7–15.4)
RPR Ser Ql: NONREACTIVE
Rh Factor: POSITIVE
Rubella Antibodies, IGG: 5.87 {index} (ref >0.99)
WBC: 5.8 x10E3/uL (ref 3.4–10.8)

## 2023-11-12 LAB — HCV RT-PCR, QUANT (NON-GRAPH): Hepatitis C Quantitation: NOT DETECTED [IU]/mL

## 2023-11-12 LAB — COMPREHENSIVE METABOLIC PANEL WITH GFR
ALT: 11 IU/L (ref 0–32)
AST: 16 IU/L (ref 0–40)
Albumin: 4.3 g/dL (ref 3.9–4.9)
Alkaline Phosphatase: 43 IU/L — ABNORMAL LOW (ref 44–121)
BUN/Creatinine Ratio: 8 — ABNORMAL LOW (ref 9–23)
BUN: 5 mg/dL — ABNORMAL LOW (ref 6–20)
Bilirubin Total: 0.3 mg/dL (ref 0.0–1.2)
CO2: 20 mmol/L (ref 20–29)
Calcium: 9.4 mg/dL (ref 8.7–10.2)
Chloride: 101 mmol/L (ref 96–106)
Creatinine, Ser: 0.59 mg/dL (ref 0.57–1.00)
Globulin, Total: 2.7 g/dL (ref 1.5–4.5)
Glucose: 81 mg/dL (ref 70–99)
Potassium: 4.6 mmol/L (ref 3.5–5.2)
Sodium: 137 mmol/L (ref 134–144)
Total Protein: 7 g/dL (ref 6.0–8.5)
eGFR: 123 mL/min/1.73 (ref >59)

## 2023-11-12 LAB — HEMOGLOBIN A1C
Est. average glucose Bld gHb Est-mCnc: 100 mg/dL
Hgb A1c MFr Bld: 5.1 % (ref 4.8–5.6)

## 2023-11-14 ENCOUNTER — Ambulatory Visit: Payer: Self-pay | Admitting: Obstetrics and Gynecology

## 2023-11-14 DIAGNOSIS — R768 Other specified abnormal immunological findings in serum: Secondary | ICD-10-CM | POA: Insufficient documentation

## 2023-11-14 DIAGNOSIS — R8271 Bacteriuria: Secondary | ICD-10-CM | POA: Insufficient documentation

## 2023-11-14 LAB — URINE CULTURE, OB REFLEX

## 2023-11-14 LAB — CULTURE, OB URINE

## 2023-11-22 ENCOUNTER — Other Ambulatory Visit (HOSPITAL_COMMUNITY): Payer: Self-pay

## 2023-11-22 ENCOUNTER — Other Ambulatory Visit: Payer: Self-pay

## 2023-11-23 ENCOUNTER — Other Ambulatory Visit (HOSPITAL_COMMUNITY): Payer: Self-pay

## 2023-11-23 MED ORDER — BUPRENORPHINE HCL 8 MG SL SUBL
8.0000 mg | SUBLINGUAL_TABLET | Freq: Three times a day (TID) | SUBLINGUAL | 0 refills | Status: DC
Start: 2023-11-23 — End: 2023-12-20
  Filled 2023-11-23 (×2): qty 90, 30d supply, fill #0

## 2023-11-24 LAB — PANORAMA PRENATAL TEST FULL PANEL:PANORAMA TEST PLUS 5 ADDITIONAL MICRODELETIONS: FETAL FRACTION: 6.7

## 2023-11-25 LAB — HORIZON CUSTOM: REPORT SUMMARY: NEGATIVE

## 2023-12-08 ENCOUNTER — Ambulatory Visit: Admitting: Obstetrics and Gynecology

## 2023-12-08 ENCOUNTER — Other Ambulatory Visit (HOSPITAL_COMMUNITY): Payer: Self-pay

## 2023-12-08 ENCOUNTER — Encounter: Payer: Self-pay | Admitting: Obstetrics and Gynecology

## 2023-12-08 ENCOUNTER — Other Ambulatory Visit: Payer: Self-pay

## 2023-12-08 VITALS — BP 105/65 | HR 118

## 2023-12-08 DIAGNOSIS — R8271 Bacteriuria: Secondary | ICD-10-CM

## 2023-12-08 DIAGNOSIS — R768 Other specified abnormal immunological findings in serum: Secondary | ICD-10-CM

## 2023-12-08 DIAGNOSIS — F1191 Opioid use, unspecified, in remission: Secondary | ICD-10-CM

## 2023-12-08 DIAGNOSIS — O0990 Supervision of high risk pregnancy, unspecified, unspecified trimester: Secondary | ICD-10-CM

## 2023-12-08 DIAGNOSIS — R000 Tachycardia, unspecified: Secondary | ICD-10-CM

## 2023-12-08 DIAGNOSIS — O99112 Other diseases of the blood and blood-forming organs and certain disorders involving the immune mechanism complicating pregnancy, second trimester: Secondary | ICD-10-CM

## 2023-12-08 DIAGNOSIS — Z8719 Personal history of other diseases of the digestive system: Secondary | ICD-10-CM

## 2023-12-08 DIAGNOSIS — Z98891 History of uterine scar from previous surgery: Secondary | ICD-10-CM

## 2023-12-08 DIAGNOSIS — M329 Systemic lupus erythematosus, unspecified: Secondary | ICD-10-CM

## 2023-12-08 DIAGNOSIS — D6862 Lupus anticoagulant syndrome: Secondary | ICD-10-CM

## 2023-12-08 DIAGNOSIS — O99891 Other specified diseases and conditions complicating pregnancy: Secondary | ICD-10-CM | POA: Diagnosis not present

## 2023-12-08 DIAGNOSIS — Z3A18 18 weeks gestation of pregnancy: Secondary | ICD-10-CM

## 2023-12-08 MED ORDER — ONDANSETRON 4 MG PO TBDP
4.0000 mg | ORAL_TABLET | Freq: Three times a day (TID) | ORAL | 1 refills | Status: DC | PRN
  Filled 2023-12-08 – 2023-12-20 (×2): qty 20, 7d supply, fill #0
  Filled 2024-01-16: qty 20, 7d supply, fill #1

## 2023-12-08 NOTE — Progress Notes (Signed)
   PRENATAL VISIT NOTE  Subjective:  Felicia Davila is a 33 y.o. H2E8857 at [redacted]w[redacted]d being seen today for ongoing prenatal care.  She is currently monitored for the following issues for this high-risk pregnancy and has Prior methotrexate  therapy; Glomus jugulare tumor (HCC); Murmur; Achilles tendon rupture, right, initial encounter; History of cesarean delivery; Supervision of high risk pregnancy, antepartum; Systemic lupus complicating pregnancy (HCC); Opioid use disorder in remission; History of cholestasis during pregnancy; Radiation exposure affecting pregnancy; Group B streptococcal bacteriuria; and Hepatitis C antibody detected on their problem list.  Patient reports dizziness and heart racing.  Contractions: Irritability. Vag. Bleeding: None.  Movement: Present. Denies leaking of fluid.   The following portions of the patient's history were reviewed and updated as appropriate: allergies, current medications, past family history, past medical history, past social history, past surgical history and problem list.   Objective:    Vitals:   12/08/23 1551  BP: 105/65  Pulse: (!) 118  SpO2: 99%    Fetal Status:  Fetal Heart Rate (bpm): 156   Movement: Present    General: Alert, oriented and cooperative. Patient is in no acute distress.  Skin: Skin is warm and dry. No rash noted.   Cardiovascular: Normal heart rate noted  Respiratory: Normal respiratory effort, no problems with respiration noted  Abdomen: Soft, gravid, appropriate for gestational age.  Pain/Pressure: Present     Pelvic: Cervical exam deferred        Extremities: Normal range of motion.  Edema: None  Mental Status: Normal mood and affect. Normal behavior. Normal judgment and thought content.   Assessment and Plan:  Pregnancy: [id number] at [redacted]w[redacted]d 1. Systemic lupus complicating pregnancy (HCC) (Primary) Will monitor for symptom flare. Currently no meds.   2. Supervision of high risk pregnancy, antepartum MSAFP today.   Will refer to Dr. Sheena for heart racing and dizziness. Had heart issues as a child.   3. Opioid use disorder in remission On Subutex . Had been on opioids for pain but wasn't continuing to work due to getting used to it. Has hopes to wean. Currently for Subutex  she is taking 6mg  a day total. She knows best not to do it while pregnant. She was on it with her last pregnancy.  Alprazolam  - uses it three times 1 mg. Takes it regularly.  Follows with psychiatry today.  Discussed option to do REACH appt. She will let me know when she is ready but she would like to do a one time appt.   4. History of cholestasis during pregnancy Will monitor for S/Sx due to high risk of recurrence.   5. History of cesarean delivery x2  6. Hepatitis C antibody detected RNA negative. Recheck in third tri  7. Group B streptococcal bacteriuria GBS in labor  8. Pregnancy with 18 completed weeks gestation   Preterm labor symptoms and general obstetric precautions including but not limited to vaginal bleeding, contractions, leaking of fluid and fetal movement were reviewed in detail with the patient. Please refer to After Visit Summary for other counseling recommendations.   Return in about 4 weeks (around 01/05/2024) for OB VISIT, MD or APP.  Future Appointments  Date Time Provider Department Center  12/30/2023 10:00 AM [facility] PROVIDER 1 [facility]  12/30/2023 10:30 AM [facility] US3 [facility]    Vina Solian, MD

## 2023-12-16 ENCOUNTER — Encounter: Payer: Self-pay | Admitting: Obstetrics and Gynecology

## 2023-12-16 ENCOUNTER — Inpatient Hospital Stay (HOSPITAL_COMMUNITY)
Admission: AD | Admit: 2023-12-16 | Discharge: 2023-12-16 | Disposition: A | Discharge status: Home / Self Care | Attending: Obstetrics & Gynecology | Admitting: Obstetrics & Gynecology

## 2023-12-16 ENCOUNTER — Telehealth: Payer: Self-pay

## 2023-12-16 ENCOUNTER — Encounter (HOSPITAL_COMMUNITY): Payer: Self-pay | Admitting: Obstetrics & Gynecology

## 2023-12-16 DIAGNOSIS — O26892 Other specified pregnancy related conditions, second trimester: Secondary | ICD-10-CM | POA: Diagnosis present

## 2023-12-16 DIAGNOSIS — G4440 Drug-induced headache, not elsewhere classified, not intractable: Secondary | ICD-10-CM

## 2023-12-16 DIAGNOSIS — Z3A20 20 weeks gestation of pregnancy: Secondary | ICD-10-CM | POA: Diagnosis not present

## 2023-12-16 LAB — CBC
HCT: 32.4 % — ABNORMAL LOW (ref 36.0–46.0)
Hemoglobin: 10.9 g/dL — ABNORMAL LOW (ref 12.0–15.0)
MCH: 30.2 pg (ref 26.0–34.0)
MCHC: 33.6 g/dL (ref 30.0–36.0)
MCV: 89.8 fL (ref 80.0–100.0)
Platelets: 188 K/uL (ref 150–400)
RBC: 3.61 MIL/uL — ABNORMAL LOW (ref 3.87–5.11)
RDW: 12.7 % (ref 11.5–15.5)
WBC: 5.8 K/uL (ref 4.0–10.5)
nRBC: 0 % (ref 0.0–0.2)

## 2023-12-16 LAB — COMPREHENSIVE METABOLIC PANEL WITH GFR
ALT: 11 U/L (ref 0–44)
AST: 17 U/L (ref 15–41)
Albumin: 2.7 g/dL — ABNORMAL LOW (ref 3.5–5.0)
Alkaline Phosphatase: 33 U/L — ABNORMAL LOW (ref 38–126)
Anion gap: 6 (ref 5–15)
BUN: 6 mg/dL (ref 6–20)
CO2: 24 mmol/L (ref 22–32)
Calcium: 8.9 mg/dL (ref 8.9–10.3)
Chloride: 110 mmol/L (ref 98–111)
Creatinine, Ser: 0.51 mg/dL (ref 0.44–1.00)
GFR, Estimated: >60 mL/min (ref >60)
Glucose, Bld: 82 mg/dL (ref 70–99)
Potassium: 3.4 mmol/L — ABNORMAL LOW (ref 3.5–5.1)
Sodium: 140 mmol/L (ref 135–145)
Total Bilirubin: 0.3 mg/dL (ref 0.0–1.2)
Total Protein: 5.7 g/dL — ABNORMAL LOW (ref 6.5–8.1)

## 2023-12-16 LAB — PROTEIN / CREATININE RATIO, URINE
Creatinine, Urine: 69 mg/dL
Protein Creatinine Ratio: 0.1 mg/mg{creat} (ref 0.00–0.15)
Total Protein, Urine: 7 mg/dL

## 2023-12-16 MED ORDER — METOCLOPRAMIDE HCL 10 MG PO TABS: 10.0000 mg | ORAL_TABLET | Freq: Once | ORAL | Status: DC

## 2023-12-16 MED ORDER — LACTATED RINGERS IV BOLUS
1000.0000 mL | Freq: Once | INTRAVENOUS | Status: AC
  Administered 2023-12-16: 1000 mL via INTRAVENOUS

## 2023-12-16 MED ORDER — METOCLOPRAMIDE HCL 5 MG/5ML PO SOLN
10.0000 mg | Freq: Once | ORAL | Status: DC
  Filled 2023-12-16: qty 10

## 2023-12-16 MED ORDER — SUMATRIPTAN SUCCINATE 50 MG PO TABS
50.0000 mg | ORAL_TABLET | ORAL | Status: DC | PRN
  Filled 2023-12-16: qty 1

## 2023-12-16 MED ORDER — CAFFEINE 200 MG PO TABS
200.0000 mg | ORAL_TABLET | Freq: Once | ORAL | Status: AC
  Administered 2023-12-16: 200 mg via ORAL
  Filled 2023-12-16: qty 1

## 2023-12-16 NOTE — MAU Note (Signed)
..  Felicia Davila is a 33 y.o. at [redacted]w[redacted]d here in MAU reporting: headache, dizziness, double vision that started a couple of weeks ago.   Blood pressure reading at home was 156/98 last night. This morning was 79/59.  Called OB and she suggested coming to MAU.   No diagnosed hypertension.   Pain score: 7/10 Vitals:   12/16/23 1230  BP: 123/60  Pulse: 91  Resp: 20  Temp: 98.5 F (36.9 C)  SpO2: 100%     FHT:145 Lab orders placed from triage:  UA

## 2023-12-16 NOTE — Discharge Instructions (Addendum)
 Please return to the MAU (Maternity Assessment Unit) if you experience vaginal bleeding,leaking/gush of fluid like your water broke, notice decreased movement from your baby after doing kick counts, or contractions the are becoming more intense or more frequent.   - do not take ibuprofen  (Motrin , Advil ) or naproxen  (Aleve , Naprosyn ) during pregnancy. Do not take  - Tylenol  is safe to take, maximum 1000mg  every 8 hours as needed; do not exceed 3000mg  per 24hr - use caution with combination medications (Excedrin, Goody, BC), especially Headache specific ones as they usually contain acetaminophen  as well  - please check your blood pressure once per day. Keep a note with the date and your blood pressure. Please bring this to every appointment.  if your blood pressure (either number) is higher than 160/110, please recheck in . If still high, please go to the Maternity Assessment Unit at [facility].

## 2023-12-16 NOTE — MAU Provider Note (Signed)
 History     CSN: [id number]  Arrival date and time: 12/16/23 1156 First Provider Initiated Contact with Patient   Chief Complaint  Patient presents with   Headache   Dizziness   Hypertension    HPI Edynn Gillock is a 33 y.o. H1E8847 at [redacted]w[redacted]d, 05/04/2024, by Last Menstrual Period, who presents to the MAU for HA and bp. She has had a persistent HA for about 2 weeks, not improved by acetaminophen  or NSAIDs. She has also had bp readings all over the place, 158/90 last night and 80s systolic this morning. She is taking her bp 5-6x/day.  She denies h/o bp concerns in prior pregnancies.   ROS (+) HA, floaters (-) CP, SOB, RUQ pain, peripheral edema  Medications Prior to Admission  Medication Sig Dispense Refill Last Dose/Taking   ALPRAZolam  (XANAX ) 1 MG tablet Take 0.5-1 tablets (0.5-1 mg total) by mouth 3 (three) times daily as needed for severe breakthrough anxiety / panic attacks 90 tablet 2 12/15/2023 Evening   aspirin  EC 81 MG tablet Take 2 tablets (162 mg total) by mouth daily. Take after 12 weeks for prevention of preeclampsia later in pregnancy 180 tablet 3 12/16/2023 Morning   buprenorphine  (SUBUTEX ) 8 MG SUBL SL tablet Place 1 tablet (8 mg total) under the tongue 3 (three) times daily. 90 tablet 0 12/16/2023 Morning   FLUoxetine  (PROZAC ) 20 MG tablet Take 20 mg by mouth daily.   12/15/2023 Evening   ondansetron  (ZOFRAN -ODT) 4 MG disintegrating tablet Take 1 tablet (4 mg total) by mouth every 8 (eight) hours as needed for nausea or vomiting. 20 tablet 1 12/15/2023 Noon   Prenatal Vit-Fe Phos-FA-Omega (VITAFOL GUMMIES) 3.33-0.333-34.8 MG CHEW Chew by mouth.   12/16/2023 Morning   Vitamin D , Ergocalciferol , (DRISDOL ) 1.25 MG (50000 UNIT) CAPS capsule Take 1 capsule (50,000 Units total) by mouth once a week. 4 capsule 5 12/16/2023 Morning   naloxone  (NARCAN ) nasal spray 4 mg/0.1 mL Place 1 spray in nostril if poorly responding or turning blue, for oversedation or accidental overdose. 2 each 0      Past Medical History:  Diagnosis Date   Anxiety    Depression    Fibroid    Lupus    PONV (postoperative nausea and vomiting)    UTI (urinary tract infection)     Past Surgical History:  Procedure Laterality Date   CESAREAN SECTION     CHOLECYSTECTOMY     DILATION AND EVACUATION N/A 11/06/2021   Procedure: DILATATION AND EVACUATION;  Surgeon: Alger Gong, MD;  Location: [facility];  Service: Gynecology;  Laterality: N/A;   FOREARM SURGERY Left    HYSTEROSCOPY WITH D & C  12/14/2021   Procedure: DILATATION AND CURETTAGE /HYSTEROSCOPY;  Surgeon: Zina Jerilynn LABOR, MD;  Location: [facility] OR;  Service: Gynecology;;     Allergies:  Allergies  Allergen Reactions   Morphine Anaphylaxis and Other (See Comments)   Morphine And Codeine Anaphylaxis, Rash and Other (See Comments)         Nitrofurantoin Nausea And Vomiting and Other (See Comments)    Also, felt weird in the head    Fentanyl  Other (See Comments)    Makes Pt hot and tingly   Hydrocodone  Other (See Comments)    Pins and needle feeling in extremities   Meloxicam      N/v   Naproxen      N/v   Chlorhexidine  Itching and Rash    ROS reviewed and pertinent positives and negatives as documented in HPI.  Physical Exam  @VSR @ Blood pressure 108/77, pulse 70, temperature 98.5 F (36.9 C), temperature source Oral, resp. rate 20, height 5' 7 (1.702 m), weight 78.9 kg, last menstrual period 07/29/2023, SpO2 100%, unknown if currently breastfeeding. Patient Vitals for the past 24 hrs:  BP Temp Temp src Pulse Resp SpO2 Height Weight  12/16/23 1315 108/77 -- -- 70 -- 100 % -- --  12/16/23 1245 103/73 -- -- 75 -- 100 % -- --  12/16/23 1230 123/60 98.5 F (36.9 C) Oral 91 20 100 % -- --  12/16/23 1228 -- -- -- -- -- -- 5' 7 (1.702 m) 78.9 kg    Labs A/Positive/-- (07/17 1539)  Results for orders placed or performed during the hospital encounter of 12/16/23 (from the past 24 hours)  Protein / creatinine ratio, urine      Status: None   Collection Time: 12/16/23 12:15 PM  Result Value Ref Range   Creatinine, Urine 69 mg/dL   Total Protein, Urine 7 mg/dL   Protein Creatinine Ratio 0.10 0.00 - 0.15 mg/mg[Cre]  CBC     Status: Abnormal   Collection Time: 12/16/23 12:42 PM  Result Value Ref Range   WBC 5.8 4.0 - 10.5 K/uL   RBC 3.61 (L) 3.87 - 5.11 MIL/uL   Hemoglobin 10.9 (L) 12.0 - 15.0 g/dL   HCT 67.5 (L) 63.9 - 53.9 %   MCV 89.8 80.0 - 100.0 fL   MCH 30.2 26.0 - 34.0 pg   MCHC 33.6 30.0 - 36.0 g/dL   RDW 87.2 88.4 - 84.4 %   Platelets 188 150 - 400 K/uL   nRBC 0.0 0.0 - 0.2 %  Comprehensive metabolic panel     Status: Abnormal   Collection Time: 12/16/23 12:42 PM  Result Value Ref Range   Sodium 140 135 - 145 mmol/L   Potassium 3.4 (L) 3.5 - 5.1 mmol/L   Chloride 110 98 - 111 mmol/L   CO2 24 22 - 32 mmol/L   Glucose, Bld 82 70 - 99 mg/dL   BUN 6 6 - 20 mg/dL   Creatinine, Ser 9.48 0.44 - 1.00 mg/dL   Calcium 8.9 8.9 - 89.6 mg/dL   Total Protein 5.7 (L) 6.5 - 8.1 g/dL   Albumin 2.7 (L) 3.5 - 5.0 g/dL   AST 17 15 - 41 U/L   ALT 11 0 - 44 U/L   Alkaline Phosphatase 33 (L) 38 - 126 U/L   Total Bilirubin 0.3 0.0 - 1.2 mg/dL   GFR, Estimated >39 >39 mL/min   Anion gap 6 5 - 15    Imaging No results found.  MAU Course   Orders Placed This Encounter  Procedures   CBC   Comprehensive metabolic panel   Protein / creatinine ratio, urine   EKG 12-Lead   Discharge patient Discharge disposition: 01-Home or Self Care; Discharge patient date: 12/16/2023   EKG: normal sinus rhythm, no ST changes.  Procedures  Pt not feeling any improvement after 1L LR, Reglan , and caffeine  tab. Ordered Imitrex , thought patient asked to DC prior to administered. Decision to avoid acetaminophen  since she has been taking Tylenol  and combination HA products. BP have been normotensive, no proteinuria.   Results pending at the time of DC: none  Assessment and Plan  MDM Audri Kozub is a 33 y.o. H1E8847  at [redacted]w[redacted]d, 05/04/2024, by Last Menstrual Period, who presents to the MAU for headache, hypertension. Ddx: gHTN, preE, dehydration, migraine, medication overuse headache. Pt requested DC before HA  was resolved. I did advise her to avoid NSAIDs and caution re: headache medications that contain acetaminophen .    ICD-10-CM   1. [redacted] weeks gestation of pregnancy  Z3A.20     2. Medication overuse headache  G44.40         Dispo: DC home in stable condition with return precautions discussed and included in AVS.    Barabara Maier, DO FMOB Fellow, Faculty practice [facility], Center for [facility]

## 2023-12-16 NOTE — Telephone Encounter (Signed)
 Patient called into office, on the way to work, heart rate beating fast, dizzy, feeling off, blood pressure at home 128/97, seeing spots that come and go, previously saw blurred vision. Pt reported eating and drinking fine, also taking medications as prescribed. Pt reported feeling fetal movement, no leaking or bleeding. RN advised for patient to be seen at the MAU. Pt reported can be at MAU within an hour. MAU provider sent secure message.   Silvano LELON Piano, RN

## 2023-12-19 ENCOUNTER — Other Ambulatory Visit (HOSPITAL_COMMUNITY): Payer: Self-pay

## 2023-12-19 ENCOUNTER — Other Ambulatory Visit: Payer: Self-pay

## 2023-12-19 ENCOUNTER — Ambulatory Visit (INDEPENDENT_AMBULATORY_CARE_PROVIDER_SITE_OTHER): Admitting: Obstetrics and Gynecology

## 2023-12-19 VITALS — BP 116/75 | HR 95 | Wt 178.0 lb

## 2023-12-19 DIAGNOSIS — O0990 Supervision of high risk pregnancy, unspecified, unspecified trimester: Secondary | ICD-10-CM

## 2023-12-19 DIAGNOSIS — R519 Headache, unspecified: Secondary | ICD-10-CM

## 2023-12-19 DIAGNOSIS — Z3A20 20 weeks gestation of pregnancy: Secondary | ICD-10-CM

## 2023-12-19 MED ORDER — DIPHENHYDRAMINE HCL 25 MG PO TABS
25.0000 mg | ORAL_TABLET | Freq: Four times a day (QID) | ORAL | 0 refills | Status: DC | PRN
  Filled 2023-12-19: qty 30, 8d supply, fill #0

## 2023-12-19 MED ORDER — PROCHLORPERAZINE MALEATE 10 MG PO TABS
10.0000 mg | ORAL_TABLET | Freq: Four times a day (QID) | ORAL | 0 refills | Status: DC | PRN
  Filled 2023-12-19: qty 30, 8d supply, fill #0

## 2023-12-19 NOTE — Progress Notes (Signed)
   PRENATAL VISIT NOTE  Subjective:  Felicia Davila is a 33 y.o. [phone] at [redacted]w[redacted]d being seen today for ongoing prenatal care.  She is currently monitored for the following issues for this high-risk pregnancy and has Prior methotrexate  therapy; Glomus jugulare tumor (HCC); Murmur; Achilles tendon rupture, right, initial encounter; History of cesarean delivery; Supervision of high risk pregnancy, antepartum; Systemic lupus complicating pregnancy (HCC); Opioid use disorder in remission; History of cholestasis during pregnancy; Radiation exposure affecting pregnancy; Group B streptococcal bacteriuria; and Hepatitis C antibody detected on their problem list.  Patient reports HA, fatigue, labile BP at home. She also reports increased dizziness. She continues to have heart palpitations> [facility] called and she plans to call them back. .  Contractions: Irritability. Vag. Bleeding: None.  Movement: (!) Decreased (Pt reported has felt less movement x 2 days). Denies leaking of fluid.   The following portions of the patient's history were reviewed and updated as appropriate: allergies, current medications, past family history, past medical history, past social history, past surgical history and problem list.   Objective:    Vitals:   12/19/23 1359  BP: 116/75  Pulse: 95  Weight: 178 lb (80.7 kg)    Fetal Status:  Fetal Heart Rate (bpm): 142   Movement: (!) Decreased (Pt reported has felt less movement x 2 days)    General: Alert, oriented and cooperative. Patient is in no acute distress.  Skin: Skin is warm and dry. No rash noted.   Cardiovascular: Normal heart rate noted  Respiratory: Normal respiratory effort, no problems with respiration noted  Abdomen: Soft, gravid, appropriate for gestational age.  Pain/Pressure: Present     Pelvic: Cervical exam deferred        Extremities: Normal range of motion.  Edema: None  Mental Status: Normal mood and affect. Normal behavior. Normal judgment and  thought content.   Assessment and Plan:  Pregnancy: [id number] at [redacted]w[redacted]d 1. Supervision of high risk pregnancy, antepartum (Primary) Discussed AFP - she will get drawn today.  Check for nutritional deficiencies/anemia type labs.  She wants to ensure not SLE flare. CMP/Cr/LFTs wnl but we will check complement levels and CKMB to confirm. Reviewed fatigue may also be normal but will confirm with lab work.  She will contact [facility] to make appt with them for palpitations.  Encouraged her to bring BP cuff to next appt to confirm functioning right.   2. Pregnancy with 20 completed weeks gestation Reviewed normal expectations for FM. Has anatomy on 9/18. She is on their wait list for sooner appt.   3. Nonintractable headache, unspecified chronicity pattern, unspecified headache type Discussed OTC measures as well as compazine  and benadryl  especially since worse in the evening.  If continued, then would refer to [name].    Preterm labor symptoms and general obstetric precautions including but not limited to vaginal bleeding, contractions, leaking of fluid and fetal movement were reviewed in detail with the patient. Please refer to After Visit Summary for other counseling recommendations.   No follow-ups on file.  Future Appointments  Date Time Provider Department Center  01/03/2024  1:30 PM Rasch, Delon FERNS, NP [facility]  01/12/2024  9:00 AM [facility] PROVIDER 1 [facility]  01/12/2024  9:30 AM [facility] US6 [facility]    Vina Solian, MD

## 2023-12-19 NOTE — Patient Instructions (Signed)
 Headache Prevention Vitamin B12 100 mg daily Magnesium Glycinate 400 mg daily  Headache Treatment Benadryl OTC + Compazine

## 2023-12-20 ENCOUNTER — Encounter: Payer: Self-pay | Admitting: Obstetrics and Gynecology

## 2023-12-20 ENCOUNTER — Other Ambulatory Visit (HOSPITAL_COMMUNITY): Payer: Self-pay

## 2023-12-20 ENCOUNTER — Other Ambulatory Visit: Payer: Self-pay

## 2023-12-20 MED ORDER — BUPRENORPHINE HCL 8 MG SL SUBL
8.0000 mg | SUBLINGUAL_TABLET | Freq: Three times a day (TID) | SUBLINGUAL | 0 refills | Status: DC
  Filled 2023-12-21 (×2): qty 90, 30d supply, fill #0

## 2023-12-21 ENCOUNTER — Encounter: Payer: Self-pay | Admitting: Obstetrics and Gynecology

## 2023-12-21 ENCOUNTER — Other Ambulatory Visit: Payer: Self-pay

## 2023-12-21 ENCOUNTER — Other Ambulatory Visit (HOSPITAL_COMMUNITY): Payer: Self-pay

## 2023-12-21 LAB — AFP, SERUM, OPEN SPINA BIFIDA
AFP MoM: 1.02
AFP Value: 50.3 ng/mL
Gest. Age on Collection Date: 19.6 wk
Maternal Age At EDD: 32.7 a
OSBR Risk 1 IN: 10000
Test Results:: NEGATIVE
Weight: 170 [lb_av]

## 2023-12-21 LAB — C3 COMPLEMENT: Complement C3, Serum: 130 mg/dL (ref 82–167)

## 2023-12-21 LAB — B12 AND FOLATE PANEL
Folate: 18.8 ng/mL (ref >3.0)
Vitamin B-12: 496 pg/mL (ref 232–1245)

## 2023-12-21 LAB — CREATININE KINASE MB: CK-MB Index: 1.4 ng/mL (ref 0.0–5.3)

## 2023-12-21 LAB — COMPLEMENT, TOTAL: Compl, Total (CH50): 60 U/mL (ref >41)

## 2023-12-21 LAB — C4 COMPLEMENT: Complement C4, Serum: 13 mg/dL (ref 12–38)

## 2023-12-21 LAB — FERRITIN: Ferritin: 11 ng/mL — ABNORMAL LOW (ref 15–150)

## 2023-12-22 ENCOUNTER — Other Ambulatory Visit (HOSPITAL_COMMUNITY)
Admission: RE | Admit: 2023-12-22 | Discharge: 2023-12-22 | Disposition: A | Discharge status: Home / Self Care | Source: Ambulatory Visit | Attending: Obstetrics and Gynecology | Admitting: Obstetrics and Gynecology

## 2023-12-22 ENCOUNTER — Ambulatory Visit: Payer: Self-pay | Admitting: Obstetrics and Gynecology

## 2023-12-22 ENCOUNTER — Ambulatory Visit (INDEPENDENT_AMBULATORY_CARE_PROVIDER_SITE_OTHER): Admitting: Obstetrics and Gynecology

## 2023-12-22 ENCOUNTER — Other Ambulatory Visit (HOSPITAL_COMMUNITY): Payer: Self-pay

## 2023-12-22 ENCOUNTER — Encounter: Payer: Self-pay | Admitting: Obstetrics and Gynecology

## 2023-12-22 VITALS — BP 106/67 | HR 88 | Wt 176.0 lb

## 2023-12-22 DIAGNOSIS — F1191 Opioid use, unspecified, in remission: Secondary | ICD-10-CM

## 2023-12-22 DIAGNOSIS — Z113 Encounter for screening for infections with a predominantly sexual mode of transmission: Secondary | ICD-10-CM | POA: Diagnosis present

## 2023-12-22 DIAGNOSIS — O0990 Supervision of high risk pregnancy, unspecified, unspecified trimester: Secondary | ICD-10-CM | POA: Diagnosis not present

## 2023-12-22 DIAGNOSIS — D509 Iron deficiency anemia, unspecified: Secondary | ICD-10-CM | POA: Insufficient documentation

## 2023-12-22 DIAGNOSIS — R768 Other specified abnormal immunological findings in serum: Secondary | ICD-10-CM

## 2023-12-22 DIAGNOSIS — N898 Other specified noninflammatory disorders of vagina: Secondary | ICD-10-CM | POA: Insufficient documentation

## 2023-12-22 DIAGNOSIS — O99891 Other specified diseases and conditions complicating pregnancy: Secondary | ICD-10-CM | POA: Diagnosis not present

## 2023-12-22 DIAGNOSIS — Z98891 History of uterine scar from previous surgery: Secondary | ICD-10-CM

## 2023-12-22 DIAGNOSIS — M329 Systemic lupus erythematosus, unspecified: Secondary | ICD-10-CM

## 2023-12-22 DIAGNOSIS — R8271 Bacteriuria: Secondary | ICD-10-CM

## 2023-12-22 LAB — POCT URINALYSIS DIPSTICK
Bilirubin, UA: NEGATIVE
Blood, UA: NEGATIVE
Glucose, UA: NEGATIVE
Ketones, UA: NEGATIVE
Nitrite, UA: NEGATIVE
Protein, UA: NEGATIVE
Spec Grav, UA: 1.01 (ref 1.010–1.025)
Urobilinogen, UA: 0.2 U/dL
pH, UA: 5 (ref 5.0–8.0)

## 2023-12-22 MED ORDER — ALPRAZOLAM 1 MG PO TABS
0.5000 mg | ORAL_TABLET | Freq: Three times a day (TID) | ORAL | 1 refills | Status: DC | PRN
  Filled 2023-12-22 – 2024-01-17 (×3): qty 90, 30d supply, fill #0

## 2023-12-22 MED ORDER — FERROUS SULFATE 325 (65 FE) MG PO TBEC
325.0000 mg | DELAYED_RELEASE_TABLET | ORAL | 3 refills | Status: DC
Start: 2023-12-22 — End: 2024-03-07
  Filled 2023-12-22: qty 30, 60d supply, fill #0

## 2023-12-22 NOTE — Progress Notes (Signed)
   PRENATAL VISIT NOTE  Subjective:  Felicia Davila is a 33 y.o. [phone] at [redacted]w[redacted]d being seen today for ongoing prenatal care.  She is currently monitored for the following issues for this high-risk pregnancy and has Prior methotrexate  therapy; Glomus jugulare tumor (HCC); Murmur; Achilles tendon rupture, right, initial encounter; History of cesarean delivery; Supervision of high risk pregnancy, antepartum; Systemic lupus complicating pregnancy (HCC); Opioid use disorder in remission; History of cholestasis during pregnancy; Radiation exposure affecting pregnancy; Group B streptococcal bacteriuria; and Hepatitis C antibody detected on their problem list.  Patient reports vaginal irritation for the past 2 days not relieved by monistat. Patient had intercourse 2 days ago with latex condoms. Contractions: Irritability. Vag. Bleeding: None.  Movement: Present. Denies leaking of fluid.   The following portions of the patient's history were reviewed and updated as appropriate: allergies, current medications, past family history, past medical history, past social history, past surgical history and problem list.   Objective:    Vitals:   12/22/23 1335  BP: 106/67  Pulse: 88  Weight: 176 lb (79.8 kg)    Fetal Status:  Fetal Heart Rate (bpm): 153   Movement: Present    General: Alert, oriented and cooperative. Patient is in no acute distress.  Skin: Skin is warm and dry. No rash noted.   Cardiovascular: Normal heart rate noted  Respiratory: Normal respiratory effort, no problems with respiration noted  Abdomen: Soft, gravid, appropriate for gestational age.  Pain/Pressure: Present     Pelvic: Cervical exam performed in the presence of a chaperone        Extremities: Normal range of motion.     Mental Status: Normal mood and affect. Normal behavior. Normal judgment and thought content.   Assessment and Plan:  Pregnancy: [id number] at [redacted]w[redacted]d 1. Vaginal irritation Patient with likely latex allergy  given presence of generalized erythema Small lacerations noted on the inferior aspect of the introitus Pain likely secondary to vaginal trauma and latex sensitivity - STI screening collected - Cervicovaginal ancillary only - Culture, OB Urine  2. Screening examination for STD (sexually transmitted disease) (Primary)  3. Supervision of high risk pregnancy, antepartum No obstetrical issues Good fetal movement  4. Systemic lupus complicating pregnancy (HCC) Stable  5. Opioid use disorder in remission Stable  6. History of cesarean delivery  7. Group B streptococcal bacteriuria  8. Hepatitis C antibody detected   Preterm labor symptoms and general obstetric precautions including but not limited to vaginal bleeding, contractions, leaking of fluid and fetal movement were reviewed in detail with the patient. Please refer to After Visit Summary for other counseling recommendations.   No follow-ups on file.  Future Appointments  Date Time Provider Department Center  01/03/2024  1:30 PM Rasch, Delon FERNS, NP [facility]  01/12/2024  9:00 AM [facility] PROVIDER 1 [facility]  01/12/2024  9:30 AM [facility] US6 [facility]    Winton Felt, MD

## 2023-12-23 ENCOUNTER — Telehealth: Payer: Self-pay

## 2023-12-23 ENCOUNTER — Encounter: Payer: Self-pay | Admitting: Obstetrics and Gynecology

## 2023-12-23 ENCOUNTER — Other Ambulatory Visit (HOSPITAL_COMMUNITY): Payer: Self-pay

## 2023-12-23 ENCOUNTER — Ambulatory Visit: Payer: Self-pay | Admitting: Obstetrics and Gynecology

## 2023-12-23 ENCOUNTER — Other Ambulatory Visit: Payer: Self-pay

## 2023-12-23 LAB — CERVICOVAGINAL ANCILLARY ONLY
Bacterial Vaginitis (gardnerella): NEGATIVE
Candida Glabrata: NEGATIVE
Candida Vaginitis: NEGATIVE
Chlamydia: NEGATIVE
Comment: NEGATIVE
Comment: NEGATIVE
Comment: NEGATIVE
Comment: NEGATIVE
Comment: NEGATIVE
Comment: NORMAL
Neisseria Gonorrhea: NEGATIVE
Trichomonas: NEGATIVE

## 2023-12-23 LAB — RPR+HBSAG+HCVAB+...
HIV Screen 4th Generation wRfx: NONREACTIVE
Hep C Virus Ab: REACTIVE — AB
Hepatitis B Surface Ag: NEGATIVE
RPR Ser Ql: NONREACTIVE

## 2023-12-23 NOTE — Telephone Encounter (Addendum)
 Dr. Cleatus, patient will be scheduled as soon as possible.  Auth Submission: NO AUTH NEEDED Site of care: Site of care: [facility] Payer: [place] [employer] [place] medicaid and BCBS of [place] Medication & CPT/J Code(s) submitted: Venofer  (Iron  Sucrose) J1756 Diagnosis Code:  Route of submission (phone, fax, portal):  Phone # Fax # Auth type: Buy/Bill PB Units/visits requested: 200mg  x 5 doses Reference number:  Approval from: 12/23/23 to 04/23/24

## 2023-12-27 ENCOUNTER — Encounter: Payer: Self-pay | Admitting: Obstetrics and Gynecology

## 2023-12-28 ENCOUNTER — Ambulatory Visit (INDEPENDENT_AMBULATORY_CARE_PROVIDER_SITE_OTHER)

## 2023-12-28 ENCOUNTER — Encounter: Payer: Self-pay | Admitting: Obstetrics and Gynecology

## 2023-12-28 ENCOUNTER — Ambulatory Visit

## 2023-12-28 VITALS — BP 110/74 | HR 85 | Ht 67.0 in | Wt 178.0 lb

## 2023-12-28 VITALS — BP 97/65 | HR 73 | Temp 98.2°F | Resp 16 | Ht 67.0 in | Wt 179.0 lb

## 2023-12-28 DIAGNOSIS — D508 Other iron deficiency anemias: Secondary | ICD-10-CM

## 2023-12-28 DIAGNOSIS — R300 Dysuria: Secondary | ICD-10-CM | POA: Diagnosis not present

## 2023-12-28 LAB — POCT URINALYSIS DIPSTICK
Bilirubin, UA: NEGATIVE
Blood, UA: NEGATIVE
Glucose, UA: NEGATIVE
Ketones, UA: NEGATIVE
Leukocytes, UA: NEGATIVE
Nitrite, UA: NEGATIVE
Protein, UA: NEGATIVE
Spec Grav, UA: 1.01 (ref 1.010–1.025)
Urobilinogen, UA: 0.2 U/dL
pH, UA: 6 (ref 5.0–8.0)

## 2023-12-28 LAB — URINE CULTURE, OB REFLEX

## 2023-12-28 LAB — CULTURE, OB URINE

## 2023-12-28 MED ORDER — IRON SUCROSE 20 MG/ML IV SOLN
200.0000 mg | Freq: Once | INTRAVENOUS | Status: AC
  Administered 2023-12-28: 200 mg via INTRAVENOUS
  Filled 2023-12-28: qty 10

## 2023-12-28 MED ORDER — SODIUM CHLORIDE 0.9 % IV BOLUS
250.0000 mL | Freq: Once | INTRAVENOUS | Status: AC
  Administered 2023-12-28: 250 mL via INTRAVENOUS
  Filled 2023-12-28: qty 250

## 2023-12-28 NOTE — Progress Notes (Signed)
 Diagnosis: Iron  Deficiency Anemia  Provider:  Mannam, Praveen MD  Procedure: IV Push  IVP administered with NS running for patient comfort.  IV Type: Peripheral, IV Location: L Antecubital  Venofer  (Iron  Sucrose), Dose: 200 mg  Post Infusion IV Care: Observation period completed and Peripheral IV Discontinued  Discharge: Condition: Good, Destination: Home . AVS Provided  Performed by:  Rocky FORBES Sar, RN

## 2023-12-28 NOTE — Progress Notes (Signed)
 Subjective:  Felicia Davila is a 33 y.o. female here for Vital Signs Check and UTI symptom/dysuria.   Pt reported woke up this morning, blood pressure 138/96 and pulse 134 at home. Pt reported still having dizziness, headache, nausea and palpitations. Pt reported palpitations improved since this morning, feeling good fetal movement. Pt reported drove to work and decided not able to go in. Pt reported is waiting for cardiology to call her back. RN offered to call while patient in office, pt agreed. RN called cardiology office, unable to get through. Pt missed call while while in office, was able to call back and get scheduled for 12/30/23.   Hypertension ROS: Patient denies any visual symptoms, RUQ/epigastric pain or other concerning symptoms. Pt reported has had a headache 6/10 on pain scale.  Objective:  BP 110/74   Pulse 85   Ht 5' 7 (1.702 m)   Wt 178 lb (80.7 kg)   LMP 07/29/2023 Comment: got pregnant on the pill.  hadn't been having periods  had spotting 2 wks ago and again on Thur.  BMI 27.88 kg/m   Appearance alert, well appearing, and in no distress.    Assessment:   Vital Signs WNL. Urine Dip-negative for all components.  Plan:  RN reviewed when to notify provider and or go to MAU. Pt verbalized understanding.      Silvano LELON Piano, RN

## 2023-12-29 ENCOUNTER — Other Ambulatory Visit (HOSPITAL_COMMUNITY): Payer: Self-pay

## 2023-12-29 ENCOUNTER — Inpatient Hospital Stay (HOSPITAL_COMMUNITY)
Admission: AD | Admit: 2023-12-29 | Discharge: 2023-12-29 | Discharge status: Against Medical Advice | Payer: Self-pay | Attending: Obstetrics & Gynecology | Admitting: Obstetrics & Gynecology

## 2023-12-29 ENCOUNTER — Other Ambulatory Visit: Payer: Self-pay | Admitting: Obstetrics and Gynecology

## 2023-12-29 ENCOUNTER — Encounter: Payer: Self-pay | Admitting: Obstetrics and Gynecology

## 2023-12-29 ENCOUNTER — Encounter (HOSPITAL_COMMUNITY): Payer: Self-pay | Admitting: Obstetrics & Gynecology

## 2023-12-29 ENCOUNTER — Telehealth: Payer: Self-pay

## 2023-12-29 DIAGNOSIS — R030 Elevated blood-pressure reading, without diagnosis of hypertension: Secondary | ICD-10-CM | POA: Diagnosis not present

## 2023-12-29 DIAGNOSIS — Z3A21 21 weeks gestation of pregnancy: Secondary | ICD-10-CM | POA: Insufficient documentation

## 2023-12-29 DIAGNOSIS — R000 Tachycardia, unspecified: Secondary | ICD-10-CM | POA: Insufficient documentation

## 2023-12-29 DIAGNOSIS — M545 Low back pain, unspecified: Secondary | ICD-10-CM | POA: Insufficient documentation

## 2023-12-29 DIAGNOSIS — D447 Neoplasm of uncertain behavior of aortic body and other paraganglia: Secondary | ICD-10-CM

## 2023-12-29 DIAGNOSIS — O26892 Other specified pregnancy related conditions, second trimester: Secondary | ICD-10-CM | POA: Diagnosis not present

## 2023-12-29 DIAGNOSIS — R900 Intracranial space-occupying lesion found on diagnostic imaging of central nervous system: Secondary | ICD-10-CM | POA: Insufficient documentation

## 2023-12-29 DIAGNOSIS — R519 Headache, unspecified: Secondary | ICD-10-CM | POA: Diagnosis present

## 2023-12-29 DIAGNOSIS — O2342 Unspecified infection of urinary tract in pregnancy, second trimester: Secondary | ICD-10-CM | POA: Insufficient documentation

## 2023-12-29 DIAGNOSIS — R42 Dizziness and giddiness: Secondary | ICD-10-CM | POA: Diagnosis not present

## 2023-12-29 DIAGNOSIS — N390 Urinary tract infection, site not specified: Secondary | ICD-10-CM | POA: Insufficient documentation

## 2023-12-29 DIAGNOSIS — R1030 Lower abdominal pain, unspecified: Secondary | ICD-10-CM | POA: Insufficient documentation

## 2023-12-29 DIAGNOSIS — D6862 Lupus anticoagulant syndrome: Secondary | ICD-10-CM | POA: Diagnosis not present

## 2023-12-29 DIAGNOSIS — O99112 Other diseases of the blood and blood-forming organs and certain disorders involving the immune mechanism complicating pregnancy, second trimester: Secondary | ICD-10-CM | POA: Diagnosis not present

## 2023-12-29 LAB — URINALYSIS, ROUTINE W REFLEX MICROSCOPIC
Bilirubin Urine: NEGATIVE
Glucose, UA: NEGATIVE mg/dL
Hgb urine dipstick: NEGATIVE
Ketones, ur: NEGATIVE mg/dL
Leukocytes,Ua: NEGATIVE
Nitrite: NEGATIVE
Protein, ur: NEGATIVE mg/dL
Specific Gravity, Urine: 1.006 (ref 1.005–1.030)
pH: 6 (ref 5.0–8.0)

## 2023-12-29 MED ORDER — CEPHALEXIN 500 MG PO CAPS
500.0000 mg | ORAL_CAPSULE | Freq: Four times a day (QID) | ORAL | 2 refills | Status: DC
  Filled 2023-12-29: qty 28, 7d supply, fill #0

## 2023-12-29 NOTE — Telephone Encounter (Signed)
 RN attempted to call patient to confirm received mychart message from provider. RN left HIPAA compliant voicemail to return RN call.  Silvano LELON Piano, RN

## 2023-12-29 NOTE — MAU Note (Addendum)
 Jkayla Spiewak is a 32 y.o. at [redacted]w[redacted]d here in MAU reporting getting iron  infusion yesterday. Feels she did not think it agreed with her. States heart rate was up during the night and b/p was up and down. Pt states has brain tumor which she has had for years. Also has lupus. Pt messaged Dr Cleatus during the night and MD wanted her to be seen. Constant h/a that will not go away. Dizziness and seeing spots. Some lower abd and lower back pain.  Feels heart pounding in her ears. States this is not new but worse after iron  infusion. Tylenol  and ASA not helping pain.States provider called in antibiotic for uti today but she has not started yet  LMP: na Onset of complaint: early am Pain score: 7 for cramps and 6 for h/a Vitals:   12/29/23 1956 12/29/23 1959  BP:  114/72  Pulse: 90   Resp: 17   Temp: 98.2 F (36.8 C)   SpO2: 100%      FHT: 143  Lab orders placed from triage: ua

## 2023-12-30 ENCOUNTER — Ambulatory Visit

## 2023-12-30 ENCOUNTER — Encounter: Payer: Self-pay | Admitting: Cardiology

## 2023-12-30 ENCOUNTER — Other Ambulatory Visit

## 2023-12-30 ENCOUNTER — Ambulatory Visit (INDEPENDENT_AMBULATORY_CARE_PROVIDER_SITE_OTHER): Admitting: Cardiology

## 2023-12-30 VITALS — BP 114/70 | HR 99 | Ht 67.0 in | Wt 178.0 lb

## 2023-12-30 DIAGNOSIS — R002 Palpitations: Secondary | ICD-10-CM

## 2023-12-30 DIAGNOSIS — I38 Endocarditis, valve unspecified: Secondary | ICD-10-CM

## 2023-12-30 DIAGNOSIS — R0602 Shortness of breath: Secondary | ICD-10-CM

## 2023-12-30 DIAGNOSIS — Z3A22 22 weeks gestation of pregnancy: Secondary | ICD-10-CM

## 2023-12-30 NOTE — Progress Notes (Signed)
 Cardio-Obstetrics Clinic  New Evaluation  Date:  12/31/2023   ID:  Felicia Davila, DOB 09/20/1990, MRN [id number]  PCP:  Pcp, No   [facility] Providers Cardiologist:  Dub Huntsman, DO  Electrophysiologist:  None       Referring MD: [name], MD   Chief Complaint:  I am ok  History of Present Illness:    Felicia Davila is a 33 y.o. female [[id number]] who is being seen today for the evaluation of palpitations at the request of [name], MD.   She presents with elevated heart rate during her third pregnancy. She was referred by her OB for evaluation of heart rate issues during pregnancy.  She experiences elevated heart rates between 120 to 130 beats per minute at rest, occurring throughout the day regardless of activity level, accompanied by a sensation of near syncope.   She has a childhood history of heart problems, though specifics are unclear. Her brother has Wolff-Parkinson-White syndrome, and there is a family history of this condition. She has a known heart murmur.  She is currently receiving iron  infusions for anemia, with persistent fatigue. Her diet includes chicken, lean meats, and salads, and she has eliminated caffeine .  She experiences shortness of breath and severe migraines. She is currently [redacted] weeks pregnant.   Prior CV Studies Reviewed: The following studies were reviewed today:   Past Medical History:  Diagnosis Date   Anxiety    Depression    Fibroid    Lupus    PONV (postoperative nausea and vomiting)    UTI (urinary tract infection)     Past Surgical History:  Procedure Laterality Date   CESAREAN SECTION     CHOLECYSTECTOMY     DILATION AND EVACUATION N/A 11/06/2021   Procedure: DILATATION AND EVACUATION;  Surgeon: Alger Gong, MD;  Location: [facility] OR;  Service: Gynecology;  Laterality: N/A;   FOREARM SURGERY Left    HYSTEROSCOPY WITH D & C  12/14/2021   Procedure: DILATATION AND CURETTAGE /HYSTEROSCOPY;  Surgeon: Zina Jerilynn LABOR, MD;  Location: [facility] OR;  Service: Gynecology;;      OB History     Gravida  8   Para  2   Term  1   Preterm  1   AB  5   Living  2      SAB  3   IAB      Ectopic  1   Multiple  0   Live Births  2               Current Medications: Current Meds  Medication Sig   ALPRAZolam  (XANAX ) 1 MG tablet Take 0.5-1 tablets (0.5-1 mg total) by mouth 3 (three) times daily as needed for severe breakthrough anxiety / panic attacks   ALPRAZolam  (XANAX ) 1 MG tablet 1/2 to 1 tablet three times daily, as needed for severe breakthrough anxiety/ panic attacks   aspirin  EC 81 MG tablet Take 2 tablets (162 mg total) by mouth daily. Take after 12 weeks for prevention of preeclampsia later in pregnancy   buprenorphine  (SUBUTEX ) 8 MG SUBL SL tablet Place 1 tablet (8 mg total) under the tongue 3 (three) times daily. (Patient taking differently: Place 8 mg under the tongue 3 (three) times daily. Pt reported taking 4mg  twice daily.)   diphenhydrAMINE  (ALLERGY RELIEF) 25 MG tablet Take 1 tablet (25 mg total) by mouth every 6 (six) hours as needed.   ferrous sulfate  325 (65 FE) MG EC tablet Take 1 tablet (  325 mg total) by mouth every other day.   FLUoxetine  (PROZAC ) 20 MG tablet Take 20 mg by mouth daily.   naloxone  (NARCAN ) nasal spray 4 mg/0.1 mL Place 1 spray in nostril if poorly responding or turning blue, for oversedation or accidental overdose.   ondansetron  (ZOFRAN -ODT) 4 MG disintegrating tablet Take 1 tablet (4 mg total) by mouth every 8 (eight) hours as needed for nausea or vomiting.   Prenatal Vit-Fe Phos-FA-Omega (VITAFOL GUMMIES) 3.33-0.333-34.8 MG CHEW Chew by mouth.   prochlorperazine  (COMPAZINE ) 10 MG tablet Take 1 tablet (10 mg total) by mouth every 6 (six) hours as needed for nausea or vomiting.   Vitamin D , Ergocalciferol , (DRISDOL ) 1.25 MG (50000 UNIT) CAPS capsule Take 1 capsule (50,000 Units total) by mouth once a week.   [DISCONTINUED] cephALEXin  (KEFLEX ) 500 MG  capsule Take 1 capsule (500 mg total) by mouth 4 (four) times daily.     Allergies:   Morphine, Morphine and codeine, Nitrofurantoin, Fentanyl , Hydrocodone , Meloxicam , Naproxen , and Chlorhexidine    Social History   Socioeconomic History   Marital status: Single    Spouse name: Not on file   Number of children: Not on file   Years of education: Not on file   Highest education level: Not on file  Occupational History   Occupation: Pension scheme manager [employer]  Tobacco Use   Smoking status: Former    Types: Cigarettes    Passive exposure: Past   Smokeless tobacco: Never  Vaping Use   Vaping status: Every Day   Substances: Nicotine   Devices: Nicotine  Substance and Sexual Activity   Alcohol use: Not Currently   Drug use: No   Sexual activity: Yes    Birth control/protection: None  Other Topics Concern   Not on file  Social History Narrative   Not on file   Social Drivers of Health   Financial Resource Strain: Not on file  Food Insecurity: Medium Risk (01/22/2023)   Received from [facility]   Hunger Vital Sign    Within the past 12 months, you worried that your food would run out before you got money to buy more: Sometimes true    Within the past 12 months, the food you bought just didn't last and you didn't have money to get more. : Sometimes true  Transportation Needs: No Transportation Needs (01/22/2023)   Received from [facility]    In the past 12 months, has lack of reliable transportation kept you from medical appointments, meetings, work or from getting things needed for daily living? : No  Physical Activity: Not on file  Stress: Not on file  Social Connections: Unknown (02/15/2023)   [facility]   Social Network    Social Network: Not on file      Family History  Problem Relation Age of Onset   Cancer Mother        Small Cell Lung Cancer   Lymphoma Mother    Clotting disorder Mother    Lupus Father    COPD Father     Autism spectrum disorder Son    ADD / ADHD Son    Cancer Paternal Grandfather       ROS:   Please see the history of present illness.    Palpitations and shortness of breath All other systems reviewed and are negative.   Labs/EKG Reviewed:    EKG: None today    Recent Labs: 12/16/2023: ALT 11; BUN 6; Creatinine, Ser 0.51; Hemoglobin 10.9; Platelets  188; Potassium 3.4; Sodium 140   Recent Lipid Panel No results found for: CHOL, TRIG, HDL, CHOLHDL, LDLCALC, LDLDIRECT  Physical Exam:    VS:  BP 114/70 (BP Location: Left Arm, Patient Position: Sitting)   Pulse 99   Ht 5' 7 (1.702 m)   Wt 178 lb (80.7 kg)   LMP 07/29/2023 Comment: got pregnant on the pill.  hadn't been having periods  had spotting 2 wks ago and again on Thur.  SpO2 99%   BMI 27.88 kg/m     Wt Readings from Last 3 Encounters:  12/30/23 178 lb (80.7 kg)  12/29/23 178 lb (80.7 kg)  12/28/23 179 lb (81.2 kg)     GEN:  Well nourished, well developed in no acute distress HEENT: Normal NECK: No JVD; No carotid bruits LYMPHATICS: No lymphadenopathy CARDIAC: RRR, no murmurs, rubs, gallops RESPIRATORY:  Clear to auscultation without rales, wheezing or rhonchi  ABDOMEN: Soft, non-tender, non-distended MUSCULOSKELETAL:  No edema; No deformity  SKIN: Warm and dry NEUROLOGIC:  Alert and oriented x 3 PSYCHIATRIC:  Normal affect    Risk Assessment/Risk Calculators:                 ASSESSMENT & PLAN:    Iron  deficiency anemia complicating pregnancy, second trimester Iron  deficiency anemia contributing to tachycardia and fatigue. Ferritin at 11, hemoglobin decreased from 13 to 10.9. - Continue iron  infusions. - Encourage dietary intake of iron -rich foods such as beef, lima beans, lentils, and spinach.  Tachycardia and cardiac murmur in pregnancy, second trimester Persistent tachycardia with heart rate 120s-130s. Family history of WPW syndrome. Low-pitched diastolic murmur present.  - Place a  heart monitor to assess for arrhythmias. - Order echocardiogram to evaluate cardiac function.  Shortness of breath in pregnancy, second trimester Intermittent shortness of breath likely due to anemia and possible cardiac issues.  Fatigue in pregnancy, second trimester Significant fatigue likely due to anemia and pregnancy.  Supervision of normal pregnancy, second trimester [redacted] weeks pregnant. Monitoring for blood pressure fluctuations and potential complications. On aspirin  therapy for blood pressure management. - Continue aspirin  therapy. - Monitor blood pressure daily and report if systolic >140 or diastolic >90 on two occasions. - Schedule virtual follow-up in 12 weeks.  Patient Instructions  Medication Instructions:  NO CHANGES  Lab Work: NONE TO BE DONE TODAY.  Testing/Procedures: Your physician has requested that you have an echocardiogram. Echocardiography is a painless test that uses sound waves to create images of your heart. It provides your doctor with information about the size and shape of your heart and how well your heart's chambers and valves are working. This procedure takes approximately one hour. There are no restrictions for this procedure. Please do NOT wear cologne, perfume, aftershave, or lotions (deodorant is allowed). Please arrive 15 minutes prior to your appointment time.  Please note: We ask at that you not bring children with you during ultrasound (echo/ vascular) testing. Due to room size and safety concerns, children are not allowed in the ultrasound rooms during exams. Our front office staff cannot provide observation of children in our lobby area while testing is being conducted. An adult accompanying a patient to their appointment will only be allowed in the ultrasound room at the discretion of the ultrasound technician under special circumstances. We apologize for any inconvenience.  ZIO XT- Long Term Monitor Instructions  Your physician has requested  you wear a ZIO patch monitor for 14 days.  This is a single patch monitor. [employer] supplies one  patch monitor per enrollment. Additional stickers are not available. Please do not apply patch if you will be having a Nuclear Stress Test,  Echocardiogram, Cardiac CT, MRI, or Chest Xray during the period you would be wearing the  monitor. The patch cannot be worn during these tests. You cannot remove and re-apply the  ZIO XT patch monitor.  Your ZIO patch monitor will be mailed 3 day USPS to your address on file. It may take 3-5 days  to receive your monitor after you have been enrolled.  Once you have received your monitor, please review the enclosed instructions. Your monitor  has already been registered assigning a specific monitor serial # to you.  Billing and Patient Assistance Program Information  We have supplied [employer] with any of your insurance information on file for billing purposes. [employer] offers a sliding scale Patient Assistance Program for patients that do not have  insurance, or whose insurance does not completely cover the cost of the ZIO monitor.  You must apply for the Patient Assistance Program to qualify for this discounted rate.  To apply, please call [employer] at [phone], select option 4, select option 2, ask to apply for  Patient Assistance Program. Meredeth will ask your household income, and how many people  are in your household. They will quote your out-of-pocket cost based on that information.  [employer] will also be able to set up a 4-month, interest-free payment plan if needed.  Applying the monitor   Shave hair from upper left chest.  Hold abrader disc by orange tab. Rub abrader in 40 strokes over the upper left chest as  indicated in your monitor instructions.  Clean area with 4 enclosed alcohol pads. Let dry.  Apply patch as indicated in monitor instructions. Patch will be placed under collarbone on left  side of chest with arrow pointing upward.  Rub  patch adhesive wings for 2 minutes. Remove white label marked 1. Remove the white  label marked 2. Rub patch adhesive wings for 2 additional minutes.  While looking in a mirror, press and release button in center of patch. A small green light will  flash 3-4 times. This will be your only indicator that the monitor has been turned on.  Do not shower for the first 24 hours. You may shower after the first 24 hours.  Press the button if you feel a symptom. You will hear a small click. Record Date, Time and  Symptom in the Patient Logbook.  When you are ready to remove the patch, follow instructions on the last 2 pages of Patient  Logbook. Stick patch monitor onto the last page of Patient Logbook.  Place Patient Logbook in the blue and white box. Use locking tab on box and tape box closed  securely. The blue and white box has prepaid postage on it. Please place it in the mailbox as  soon as possible. Your physician should have your test results approximately 7 days after the  monitor has been mailed back to [facility].  Call [facility] Customer Care at [phone] if you have questions regarding  your ZIO XT patch monitor. Call them immediately if you see an orange light blinking on your  monitor.  If your monitor falls off in less than 4 days, contact our Monitor department at [phone].  If your monitor becomes loose or falls off after 4 days call [employer] at [phone] for  suggestions on securing your monitor    Follow-Up: At [facility], you and  your health needs are our priority.  As part of our continuing mission to provide you with exceptional heart care, our providers are all part of one team.  This team includes your primary Cardiologist (physician) and Advanced Practice Providers or APPs (Physician Assistants and Nurse Practitioners) who all work together to provide you with the care you need, when you need it.  Your next appointment:   12 WEEKS    Provider:   Marquiz Sotelo, DO     Other Instructions: Please take your blood pressure daily for 2 weeks and send in a MyChart message. Please include heart rates. (One message at the end of the 2 weeks).   HOW TO TAKE YOUR BLOOD PRESSURE: Rest 5 minutes before taking your blood pressure. Don't smoke or drink caffeinated beverages for at least 30 minutes before. Take your blood pressure before (not after) you eat. Sit comfortably with your back supported and both feet on the floor (don't cross your legs). Elevate your arm to heart level on a table or a desk. Use the proper sized cuff. It should fit smoothly and snugly around your bare upper arm. There should be enough room to slip a fingertip under the cuff. The bottom edge of the cuff should be 1 inch above the crease of the elbow. Ideally, take 3 measurements at one sitting and record the average.    Dispo:  No follow-ups on file.   Medication Adjustments/Labs and Tests Ordered: Current medicines are reviewed at length with the patient today.  Concerns regarding medicines are outlined above.  Tests Ordered: Orders Placed This Encounter  Procedures   LONG TERM MONITOR (3-14 DAYS)   ECHOCARDIOGRAM COMPLETE   Medication Changes: No orders of the defined types were placed in this encounter.

## 2023-12-30 NOTE — Patient Instructions (Signed)
 Medication Instructions:  NO CHANGES  Lab Work: NONE TO BE DONE TODAY.  Testing/Procedures: Your physician has requested that you have an echocardiogram. Echocardiography is a painless test that uses sound waves to create images of your heart. It provides your doctor with information about the size and shape of your heart and how well your heart's chambers and valves are working. This procedure takes approximately one hour. There are no restrictions for this procedure. Please do NOT wear cologne, perfume, aftershave, or lotions (deodorant is allowed). Please arrive 15 minutes prior to your appointment time.  Please note: We ask at that you not bring children with you during ultrasound (echo/ vascular) testing. Due to room size and safety concerns, children are not allowed in the ultrasound rooms during exams. Our front office staff cannot provide observation of children in our lobby area while testing is being conducted. An adult accompanying a patient to their appointment will only be allowed in the ultrasound room at the discretion of the ultrasound technician under special circumstances. We apologize for any inconvenience.  ZIO XT- Long Term Monitor Instructions  Your physician has requested you wear a ZIO patch monitor for 14 days.  This is a single patch monitor. [employer] supplies one patch monitor per enrollment. Additional stickers are not available. Please do not apply patch if you will be having a Nuclear Stress Test,  Echocardiogram, Cardiac CT, MRI, or Chest Xray during the period you would be wearing the  monitor. The patch cannot be worn during these tests. You cannot remove and re-apply the  ZIO XT patch monitor.  Your ZIO patch monitor will be mailed 3 day USPS to your address on file. It may take 3-5 days  to receive your monitor after you have been enrolled.  Once you have received your monitor, please review the enclosed instructions. Your monitor  has already been registered  assigning a specific monitor serial # to you.  Billing and Patient Assistance Program Information  We have supplied [employer] with any of your insurance information on file for billing purposes. [employer] offers a sliding scale Patient Assistance Program for patients that do not have  insurance, or whose insurance does not completely cover the cost of the ZIO monitor.  You must apply for the Patient Assistance Program to qualify for this discounted rate.  To apply, please call [employer] at [phone], select option 4, select option 2, ask to apply for  Patient Assistance Program. Meredeth will ask your household income, and how many people  are in your household. They will quote your out-of-pocket cost based on that information.  [employer] will also be able to set up a 2-month, interest-free payment plan if needed.  Applying the monitor   Shave hair from upper left chest.  Hold abrader disc by orange tab. Rub abrader in 40 strokes over the upper left chest as  indicated in your monitor instructions.  Clean area with 4 enclosed alcohol pads. Let dry.  Apply patch as indicated in monitor instructions. Patch will be placed under collarbone on left  side of chest with arrow pointing upward.  Rub patch adhesive wings for 2 minutes. Remove white label marked 1. Remove the white  label marked 2. Rub patch adhesive wings for 2 additional minutes.  While looking in a mirror, press and release button in center of patch. A small green light will  flash 3-4 times. This will be your only indicator that the monitor has been turned on.  Do not shower for  the first 24 hours. You may shower after the first 24 hours.  Press the button if you feel a symptom. You will hear a small click. Record Date, Time and  Symptom in the Patient Logbook.  When you are ready to remove the patch, follow instructions on the last 2 pages of Patient  Logbook. Stick patch monitor onto the last page of Patient Logbook.  Place  Patient Logbook in the blue and white box. Use locking tab on box and tape box closed  securely. The blue and white box has prepaid postage on it. Please place it in the mailbox as  soon as possible. Your physician should have your test results approximately 7 days after the  monitor has been mailed back to [facility].  Call [facility] Customer Care at [phone] if you have questions regarding  your ZIO XT patch monitor. Call them immediately if you see an orange light blinking on your  monitor.  If your monitor falls off in less than 4 days, contact our Monitor department at [phone].  If your monitor becomes loose or falls off after 4 days call [employer] at [phone] for  suggestions on securing your monitor    Follow-Up: At [facility], you and your health needs are our priority.  As part of our continuing mission to provide you with exceptional heart care, our providers are all part of one team.  This team includes your primary Cardiologist (physician) and Advanced Practice Providers or APPs (Physician Assistants and Nurse Practitioners) who all work together to provide you with the care you need, when you need it.  Your next appointment:   12 WEEKS   Provider:   Kardie Tobb, DO     Other Instructions: Please take your blood pressure daily for 2 weeks and send in a MyChart message. Please include heart rates. (One message at the end of the 2 weeks).   HOW TO TAKE YOUR BLOOD PRESSURE: Rest 5 minutes before taking your blood pressure. Don't smoke or drink caffeinated beverages for at least 30 minutes before. Take your blood pressure before (not after) you eat. Sit comfortably with your back supported and both feet on the floor (don't cross your legs). Elevate your arm to heart level on a table or a desk. Use the proper sized cuff. It should fit smoothly and snugly around your bare upper arm. There should be enough room to slip a fingertip under the cuff.  The bottom edge of the cuff should be 1 inch above the crease of the elbow. Ideally, take 3 measurements at one sitting and record the average.

## 2023-12-31 ENCOUNTER — Encounter: Payer: Self-pay | Admitting: Obstetrics and Gynecology

## 2023-12-31 ENCOUNTER — Other Ambulatory Visit (HOSPITAL_COMMUNITY): Payer: Self-pay

## 2024-01-01 ENCOUNTER — Other Ambulatory Visit (HOSPITAL_COMMUNITY): Payer: Self-pay

## 2024-01-01 NOTE — Progress Notes (Unsigned)
 PRENATAL VISIT NOTE  Subjective:  Felicia Davila is a 33 y.o. [phone] at [redacted]w[redacted]d being seen today for ongoing prenatal care.  She is currently monitored for the following issues for this high-risk pregnancy and has Prior methotrexate  therapy; Glomus jugulare tumor (HCC); Murmur; Achilles tendon rupture, right, initial encounter; History of cesarean delivery; Supervision of high risk pregnancy, antepartum; Systemic lupus complicating pregnancy (HCC); Opioid use disorder in remission; History of cholestasis during pregnancy; Radiation exposure affecting pregnancy; Group B streptococcal bacteriuria; Hepatitis C antibody detected; and Iron  deficiency anemia on their problem list.  Patient reports same complaints. Today is a better day.  Contractions: Irritability. Vag. Bleeding: None.  Movement: Present. Denies leaking of fluid.   The following portions of the patient's history were reviewed and updated as appropriate: allergies, current medications, past family history, past medical history, past social history, past surgical history and problem list.   Objective:    Vitals:   01/04/24 1600 01/04/24 1604  BP: 104/68   Pulse: (!) 129 (!) 119  Weight: 175 lb (79.4 kg)     Fetal Status:  Fetal Heart Rate (bpm): 140   Movement: Present    General: Alert, oriented and cooperative. Patient is in no acute distress.  Skin: Skin is warm and dry. No rash noted.   Cardiovascular: Normal heart rate noted  Respiratory: Normal respiratory effort, no problems with respiration noted  Abdomen: Soft, gravid, appropriate for gestational age.  Pain/Pressure: Present     Pelvic: Cervical exam deferred        Extremities: Normal range of motion.  Edema: None  Mental Status: Normal mood and affect. Normal behavior. Normal judgment and thought content.   Assessment and Plan:  Pregnancy: [id number] at [redacted]w[redacted]d 1. Supervision of high risk pregnancy, antepartum (Primary) Routine PNC up to date Has anatomy US   coming up.   2. Pregnancy with 22 completed weeks gestation Followed up with Dr. Sheena. Doing holter monitor, doing echo.  For her symptoms, Dr. Luke Masters also recommended 24 hour urinary catecholamines.  She is keeping BP log for the tachycardia and episodic BP elevations. BP today confirmed accurate with cuff compared to our office cuff.   3. Systemic lupus complicating pregnancy (HCC) No evidence of flare at this time but continue to monitor. Continue no meds.  Continue ldASA  4. Opioid use disorder in remission Subutex  4mg  twice daily  5. Iron  deficiency anemia secondary to inadequate dietary iron  intake Doing IV iron . Unable to tolerate PO  6. History of cholestasis during pregnancy Monitor for recurrence  7. History of cesarean delivery Pt to consider MOD by next appt   8. Glomus jugulare tumor [facility]) MRI done yesterday and shows no intracranial pathology.   9. Anxiety/Depression Managed by psychiatry. Taking Xanax  per our prior discussions. Prozac  for depression.  Still having panick attacks but does not want to go up on dose due to risk for baby.   Preterm labor symptoms and general obstetric precautions including but not limited to vaginal bleeding, contractions, leaking of fluid and fetal movement were reviewed in detail with the patient. Please refer to After Visit Summary for other counseling recommendations.   Return in about 4 weeks (around 02/01/2024) for OB VISIT, MD or APP.  Future Appointments  Date Time Provider Department Center  01/12/2024  9:00 AM [facility] PROVIDER 1 [facility]  01/12/2024  9:30 AM [facility] US6 [facility]  01/13/2024  3:30 PM [facility] 4 [facility] None  01/20/2024  3:30 PM [facility]IR 8 [facility] None  01/27/2024  3:30 PM [facility] 7 [facility] None  02/03/2024  3:30 PM [facility]CHAIR 8 [facility] None  02/08/2024  8:50 AM [name], MD [facility]    Vina Solian, MD

## 2024-01-02 ENCOUNTER — Encounter: Payer: Self-pay | Admitting: Cardiology

## 2024-01-02 ENCOUNTER — Encounter: Payer: Self-pay | Admitting: *Deleted

## 2024-01-03 ENCOUNTER — Encounter (HOSPITAL_COMMUNITY): Payer: Self-pay | Admitting: Obstetrics and Gynecology

## 2024-01-03 ENCOUNTER — Inpatient Hospital Stay (HOSPITAL_COMMUNITY)

## 2024-01-03 ENCOUNTER — Inpatient Hospital Stay (HOSPITAL_COMMUNITY)
Admission: AD | Admit: 2024-01-03 | Discharge: 2024-01-03 | Disposition: A | Discharge status: Home / Self Care | Payer: Self-pay | Attending: Obstetrics and Gynecology | Admitting: Obstetrics and Gynecology

## 2024-01-03 ENCOUNTER — Encounter: Admitting: Obstetrics and Gynecology

## 2024-01-03 DIAGNOSIS — O26892 Other specified pregnancy related conditions, second trimester: Secondary | ICD-10-CM | POA: Insufficient documentation

## 2024-01-03 DIAGNOSIS — M329 Systemic lupus erythematosus, unspecified: Secondary | ICD-10-CM | POA: Diagnosis present

## 2024-01-03 DIAGNOSIS — R002 Palpitations: Secondary | ICD-10-CM | POA: Diagnosis not present

## 2024-01-03 DIAGNOSIS — R42 Dizziness and giddiness: Secondary | ICD-10-CM | POA: Insufficient documentation

## 2024-01-03 DIAGNOSIS — Z3A22 22 weeks gestation of pregnancy: Secondary | ICD-10-CM | POA: Insufficient documentation

## 2024-01-03 DIAGNOSIS — R0981 Nasal congestion: Secondary | ICD-10-CM | POA: Insufficient documentation

## 2024-01-03 DIAGNOSIS — D447 Neoplasm of uncertain behavior of aortic body and other paraganglia: Secondary | ICD-10-CM | POA: Diagnosis present

## 2024-01-03 DIAGNOSIS — R519 Headache, unspecified: Secondary | ICD-10-CM | POA: Insufficient documentation

## 2024-01-03 LAB — PROTEIN / CREATININE RATIO, URINE
Creatinine, Urine: 121 mg/dL
Protein Creatinine Ratio: 0.06 mg/mg{creat} (ref 0.00–0.15)
Total Protein, Urine: 7 mg/dL

## 2024-01-03 LAB — CBC
HCT: 34.1 % — ABNORMAL LOW (ref 36.0–46.0)
Hemoglobin: 11.5 g/dL — ABNORMAL LOW (ref 12.0–15.0)
MCH: 30 pg (ref 26.0–34.0)
MCHC: 33.7 g/dL (ref 30.0–36.0)
MCV: 89 fL (ref 80.0–100.0)
Platelets: 237 K/uL (ref 150–400)
RBC: 3.83 MIL/uL — ABNORMAL LOW (ref 3.87–5.11)
RDW: 12.7 % (ref 11.5–15.5)
WBC: 5.8 K/uL (ref 4.0–10.5)
nRBC: 0 % (ref 0.0–0.2)

## 2024-01-03 LAB — RESP PANEL BY RT-PCR (RSV, FLU A&B, COVID)  RVPGX2
Influenza A by PCR: NEGATIVE
Influenza B by PCR: NEGATIVE
Resp Syncytial Virus by PCR: NEGATIVE
SARS Coronavirus 2 by RT PCR: NEGATIVE

## 2024-01-03 LAB — URINALYSIS, ROUTINE W REFLEX MICROSCOPIC
Bilirubin Urine: NEGATIVE
Glucose, UA: NEGATIVE mg/dL
Hgb urine dipstick: NEGATIVE
Ketones, ur: NEGATIVE mg/dL
Leukocytes,Ua: NEGATIVE
Nitrite: NEGATIVE
Protein, ur: NEGATIVE mg/dL
Specific Gravity, Urine: 1.018 (ref 1.005–1.030)
pH: 6 (ref 5.0–8.0)

## 2024-01-03 LAB — COMPREHENSIVE METABOLIC PANEL WITH GFR
ALT: 12 U/L (ref 0–44)
AST: 19 U/L (ref 15–41)
Albumin: 3.2 g/dL — ABNORMAL LOW (ref 3.5–5.0)
Alkaline Phosphatase: 36 U/L — ABNORMAL LOW (ref 38–126)
Anion gap: 9 (ref 5–15)
BUN: <5 mg/dL — ABNORMAL LOW (ref 6–20)
CO2: 22 mmol/L (ref 22–32)
Calcium: 8.7 mg/dL — ABNORMAL LOW (ref 8.9–10.3)
Chloride: 103 mmol/L (ref 98–111)
Creatinine, Ser: 0.52 mg/dL (ref 0.44–1.00)
GFR, Estimated: >60 mL/min (ref >60)
Glucose, Bld: 89 mg/dL (ref 70–99)
Potassium: 3.8 mmol/L (ref 3.5–5.1)
Sodium: 134 mmol/L — ABNORMAL LOW (ref 135–145)
Total Bilirubin: 0.3 mg/dL (ref 0.0–1.2)
Total Protein: 6.4 g/dL — ABNORMAL LOW (ref 6.5–8.1)

## 2024-01-03 LAB — SEDIMENTATION RATE: Sed Rate: 38 mm/h — ABNORMAL HIGH (ref 0–22)

## 2024-01-03 LAB — C-REACTIVE PROTEIN: CRP: 0.5 mg/dL (ref <1.0)

## 2024-01-03 MED ORDER — LORAZEPAM 1 MG PO TABS
0.5000 mg | ORAL_TABLET | Freq: Once | ORAL | Status: AC
  Administered 2024-01-03: 0.5 mg via ORAL
  Filled 2024-01-03: qty 1

## 2024-01-03 MED ORDER — LORAZEPAM 1 MG PO TABS
1.0000 mg | ORAL_TABLET | Freq: Once | ORAL | Status: AC
  Administered 2024-01-03: 1 mg via ORAL
  Filled 2024-01-03: qty 1

## 2024-01-03 NOTE — MAU Note (Signed)
 Felicia Davila is a 33 y.o. at [redacted]w[redacted]d here in MAU reporting: sees Dr Cleatus.  Told to come in and get a full work up. Having BP problems last 3 wks, pulse has been elevated 120's.  Having a lot more SOB, dizzy all the time, and HAs that she can't get rid of, has started seeing spots. Has been to cardiology(wore monitor), hematology(getting iron  infustions), neurology (wanting MRI due to brain tumor). Tracking BP, not constantly high.  Onset of complaint: 3wks, worse in past wk Pain score: HA 8, cramping 7 front and back - worse when standing Vitals:   01/03/24 1044  BP: 116/76  Pulse: (!) 109  Resp: 16  Temp: 98.4 F (36.9 C)  SpO2: 99%     FHT:148 Lab orders placed from triage:  urine

## 2024-01-03 NOTE — MAU Provider Note (Signed)
 History     CSN: [id number]  Arrival date and time: 01/03/24 1025   Event Date/Time   First Provider Initiated Contact with Patient 01/03/24 1206      Chief Complaint  Patient presents with   Headache   Hypertension   Dizziness   HPI Patient is 33 y.o. H1E8847 [redacted]w[redacted]d here with complaints of progressively worsening episodes of dizziness and palpitations and HA.  +FM, denies LOF, VB, contractions, vaginal discharge.  Dizziness Episodes/events   - Happening now  5-7 times per day - Has palpitations, dizziness with flushing and feeling warm during the events - Notes HR in the 120-130s intermittently, brother with WPW - BP during events is elevated -Worse prolonged stadning and activity -Reports feeling like she is going to fall asleep and then snaps awake -BP in episodes is 130/90s but has had SBP 170s -Notes progressively worsening anxiety -She is also having fatigue this pregnancy, unlike other pregnancies -Seen by cardiology-- wearing zio patch -Referred to neurology  Headaches - Daily in nature - not migrainous or tension or cluster HA-- she reports having these in the past and this feels very different - describes as may head is in a clamp and pressure - happening daily -7-8hr of 12 hours.  - fluctuates 4-10 pain scale, comes in waves and never fully gone - No loss of vision, loss of sensation of function in limbs - Noted spots in vision-located in the periphery sees spots or dark shadows, only present on the right.  - Reports HA currently, lower intensity about 3-4 of 10   OUD-->  subutex  1/2 tab every 3 days.  Has received recent IV venofer   OB History     Gravida  8   Para  2   Term  1   Preterm  1   AB  5   Living  2      SAB  3   IAB      Ectopic  1   Multiple  0   Live Births  2           Past Medical History:  Diagnosis Date   Anxiety    Depression    Fibroid    Lupus    PONV (postoperative nausea and vomiting)    UTI  (urinary tract infection)     Past Surgical History:  Procedure Laterality Date   CESAREAN SECTION     CHOLECYSTECTOMY     DILATION AND EVACUATION N/A 11/06/2021   Procedure: DILATATION AND EVACUATION;  Surgeon: Alger Gong, MD;  Location: [facility] OR;  Service: Gynecology;  Laterality: N/A;   FOREARM SURGERY Left    HYSTEROSCOPY WITH D & C  12/14/2021   Procedure: DILATATION AND CURETTAGE /HYSTEROSCOPY;  Surgeon: Zina Jerilynn LABOR, MD;  Location: [facility] OR;  Service: Gynecology;;    Family History  Problem Relation Age of Onset   Cancer Mother        Small Cell Lung Cancer   Lymphoma Mother    Clotting disorder Mother    Lupus Father    COPD Father    Autism spectrum disorder Son    ADD / ADHD Son    Cancer Paternal Grandfather     Social History   Tobacco Use   Smoking status: Former    Types: Cigarettes    Passive exposure: Past   Smokeless tobacco: Never  Vaping Use   Vaping status: Every Day   Substances: Nicotine   Devices: Nicotine  Substance Use Topics  Alcohol use: Not Currently   Drug use: Not Currently    Comment: was using prescription opioids for several months, transitioned to Subutex  (2025)    Allergies:  Allergies  Allergen Reactions   Morphine Anaphylaxis and Other (See Comments)   Morphine And Codeine Anaphylaxis, Rash and Other (See Comments)         Nitrofurantoin Nausea And Vomiting and Other (See Comments)    Also, felt weird in the head    Fentanyl  Other (See Comments)    Makes Pt hot and tingly   Hydrocodone  Other (See Comments)    Pins and needle feeling in extremities   Meloxicam      N/v   Naproxen      N/v   Chlorhexidine  Itching and Rash    Medications Prior to Admission  Medication Sig Dispense Refill Last Dose/Taking   ALPRAZolam  (XANAX ) 1 MG tablet Take 0.5-1 tablets (0.5-1 mg total) by mouth 3 (three) times daily as needed for severe breakthrough anxiety / panic attacks 90 tablet 2 Past Week   aspirin  EC 81 MG tablet  Take 2 tablets (162 mg total) by mouth daily. Take after 12 weeks for prevention of preeclampsia later in pregnancy 180 tablet 3 01/03/2024   buprenorphine  (SUBUTEX ) 8 MG SUBL SL tablet Place 1 tablet (8 mg total) under the tongue 3 (three) times daily. (Patient taking differently: Place 8 mg under the tongue 3 (three) times daily. Pt reported taking 4mg  twice daily.) 90 tablet 0 Past Month   diphenhydrAMINE  (ALLERGY RELIEF) 25 MG tablet Take 1 tablet (25 mg total) by mouth every 6 (six) hours as needed. 30 tablet 0 Past Month   ferrous sulfate  325 (65 FE) MG EC tablet Take 1 tablet (325 mg total) by mouth every other day. 30 tablet 3 01/02/2024   FLUoxetine  (PROZAC ) 20 MG tablet Take 20 mg by mouth daily.   01/03/2024   ondansetron  (ZOFRAN -ODT) 4 MG disintegrating tablet Take 1 tablet (4 mg total) by mouth every 8 (eight) hours as needed for nausea or vomiting. 20 tablet 1 01/03/2024   Prenatal Vit-Fe Phos-FA-Omega (VITAFOL GUMMIES) 3.33-0.333-34.8 MG CHEW Chew by mouth.   01/03/2024   prochlorperazine  (COMPAZINE ) 10 MG tablet Take 1 tablet (10 mg total) by mouth every 6 (six) hours as needed for nausea or vomiting. 30 tablet 0 Past Month   Vitamin D , Ergocalciferol , (DRISDOL ) 1.25 MG (50000 UNIT) CAPS capsule Take 1 capsule (50,000 Units total) by mouth once a week. 4 capsule 5 01/02/2024   ALPRAZolam  (XANAX ) 1 MG tablet 1/2 to 1 tablet three times daily, as needed for severe breakthrough anxiety/ panic attacks 90 tablet 1    naloxone  (NARCAN ) nasal spray 4 mg/0.1 mL Place 1 spray in nostril if poorly responding or turning blue, for oversedation or accidental overdose. 2 each 0     Review of Systems  Constitutional:  Negative for chills and fever.  HENT:  Negative for congestion and sore throat.   Eyes:  Positive for visual disturbance. Negative for pain.  Respiratory:  Negative for cough, chest tightness and shortness of breath.   Cardiovascular:  Positive for palpitations. Negative for chest pain.   Gastrointestinal:  Negative for abdominal pain, diarrhea, nausea and vomiting.  Endocrine: Negative for cold intolerance and heat intolerance.  Genitourinary:  Negative for dysuria and flank pain.  Musculoskeletal:  Negative for back pain.  Skin:  Negative for rash.  Allergic/Immunologic: Negative for food allergies.  Neurological:  Positive for dizziness, light-headedness and headaches.  Psychiatric/Behavioral:  Negative  for agitation.    Physical Exam   Blood pressure 116/76, pulse (!) 109, temperature 98.4 F (36.9 C), temperature source Oral, resp. rate 16, height 5' 7 (1.702 m), weight 80.2 kg, last menstrual period 07/29/2023, SpO2 99%, unknown if currently breastfeeding.  Physical Exam Vitals and nursing note reviewed.  Constitutional:      General: She is not in acute distress.    Appearance: She is well-developed.     Comments: Pregnant female  HENT:     Head: Normocephalic and atraumatic.  Eyes:     General: No scleral icterus.    Conjunctiva/sclera: Conjunctivae normal.  Cardiovascular:     Rate and Rhythm: Normal rate.  Pulmonary:     Effort: Pulmonary effort is normal.  Chest:     Chest wall: No tenderness.  Abdominal:     Palpations: Abdomen is soft.     Tenderness: There is no abdominal tenderness. There is no guarding or rebound.     Comments: Gravid  Genitourinary:    Vagina: Normal.  Musculoskeletal:        General: Normal range of motion.     Cervical back: Normal range of motion and neck supple.  Skin:    General: Skin is warm and dry.     Findings: No rash.  Neurological:     Mental Status: She is alert and oriented to person, place, and time.     MAU Course  Procedures  MDM- HIGH - ddx includes sequale from her glomus jugulare vs cardiac arrthymia. Less likely but also on ddx is pheochromocytomia  - Worsening HA is concerning given visual changes present.   Talked to radiology about imaging. They recommend MRI w/o contrast and MRV.  Possible CT with contrast if needed.   Results for orders placed or performed during the hospital encounter of 01/03/24 (from the past 24 hours)  Urinalysis, Routine w reflex microscopic -Urine, Clean Catch     Status: None   Collection Time: 01/03/24 11:15 AM  Result Value Ref Range   Color, Urine YELLOW YELLOW   APPearance CLEAR CLEAR   Specific Gravity, Urine 1.018 1.005 - 1.030   pH 6.0 5.0 - 8.0   Glucose, UA NEGATIVE NEGATIVE mg/dL   Hgb urine dipstick NEGATIVE NEGATIVE   Bilirubin Urine NEGATIVE NEGATIVE   Ketones, ur NEGATIVE NEGATIVE mg/dL   Protein, ur NEGATIVE NEGATIVE mg/dL   Nitrite NEGATIVE NEGATIVE   Leukocytes,Ua NEGATIVE NEGATIVE  Protein / creatinine ratio, urine     Status: None   Collection Time: 01/03/24 11:15 AM  Result Value Ref Range   Creatinine, Urine 121 mg/dL   Total Protein, Urine 7 mg/dL   Protein Creatinine Ratio 0.06 0.00 - 0.15 mg/mg[Cre]  Comprehensive metabolic panel     Status: Abnormal   Collection Time: 01/03/24  1:05 PM  Result Value Ref Range   Sodium 134 (L) 135 - 145 mmol/L   Potassium 3.8 3.5 - 5.1 mmol/L   Chloride 103 98 - 111 mmol/L   CO2 22 22 - 32 mmol/L   Glucose, Bld 89 70 - 99 mg/dL   BUN <5 (L) 6 - 20 mg/dL   Creatinine, Ser 9.47 0.44 - 1.00 mg/dL   Calcium 8.7 (L) 8.9 - 10.3 mg/dL   Total Protein 6.4 (L) 6.5 - 8.1 g/dL   Albumin 3.2 (L) 3.5 - 5.0 g/dL   AST 19 15 - 41 U/L   ALT 12 0 - 44 U/L   Alkaline Phosphatase 36 (L) 38 -  126 U/L   Total Bilirubin 0.3 0.0 - 1.2 mg/dL   GFR, Estimated >39 >39 mL/min   Anion gap 9 5 - 15  CBC     Status: Abnormal   Collection Time: 01/03/24  1:05 PM  Result Value Ref Range   WBC 5.8 4.0 - 10.5 K/uL   RBC 3.83 (L) 3.87 - 5.11 MIL/uL   Hemoglobin 11.5 (L) 12.0 - 15.0 g/dL   HCT 65.8 (L) 63.9 - 53.9 %   MCV 89.0 80.0 - 100.0 fL   MCH 30.0 26.0 - 34.0 pg   MCHC 33.7 30.0 - 36.0 g/dL   RDW 87.2 88.4 - 84.4 %   Platelets 237 150 - 400 K/uL   nRBC 0.0 0.0 - 0.2 %  C-reactive  protein     Status: None   Collection Time: 01/03/24  1:05 PM  Result Value Ref Range   CRP 0.5 <1.0 mg/dL  Sedimentation rate     Status: Abnormal   Collection Time: 01/03/24  1:05 PM  Result Value Ref Range   Sed Rate 38 (H) 0 - 22 mm/hr  Resp panel by RT-PCR (RSV, Flu A&B, Covid) Anterior Nasal Swab     Status: None   Collection Time: 01/03/24  4:58 PM   Specimen: Anterior Nasal Swab  Result Value Ref Range   SARS Coronavirus 2 by RT PCR NEGATIVE NEGATIVE   Influenza A by PCR NEGATIVE NEGATIVE   Influenza B by PCR NEGATIVE NEGATIVE   Resp Syncytial Virus by PCR NEGATIVE NEGATIVE   7:34 PM Finally in MRI, received Ativan  1mg  and reported needing more medications. Additional 0.5mg  written  10:24 PM Spoke with Dr. Morene Hoard-- specifically about extra axial tissue, on the right. He does not see the same glomus jugulare signals on our imaging. He is reviewing her imaging in detail. No mass effect or edema on imaging.    10:55 PM updated patient with results of MRI. Provided letter for work Assessment and Plan   1. Dizziness   2. [redacted] weeks gestation of pregnancy   3. Palpitations   4. Nasal congestion     Discharged stable condition.  - r/o glomus jugulare expansion as cause of sx - needs zio patch replaced - Sent home with 24 hr urine for urine metanephrines to r/o pheochromocytoma. Will bring to [facility] office - has follow up Wed  Suzen Maryan Masters 01/03/2024, 10:55 PM

## 2024-01-03 NOTE — Discharge Instructions (Signed)
 You were seen in the maternity assessment unit for evaluation for your recurrent episodes of dizziness with palpitations in the setting of worsening headache.  We collected a blood work here which was overall reassuring.  One of the most important things that we did while you are here was perform an MRI of your head and venous system in your head.  It did not show any expansion of the concern that was noted in 2018.  It is likely that there is nothing in your skull that is causing the worsening of your symptoms.  You were sent home with a 24-hour urine collection jug.  Please put all your urine for the next 24 hours until 1230 tomorrow and this jog.  You will be able to bring this to your outpatient OB office [facility] for analysis.

## 2024-01-04 ENCOUNTER — Ambulatory Visit (INDEPENDENT_AMBULATORY_CARE_PROVIDER_SITE_OTHER): Payer: Self-pay | Admitting: Obstetrics and Gynecology

## 2024-01-04 VITALS — BP 104/68 | HR 119 | Wt 175.0 lb

## 2024-01-04 DIAGNOSIS — Z98891 History of uterine scar from previous surgery: Secondary | ICD-10-CM

## 2024-01-04 DIAGNOSIS — O0990 Supervision of high risk pregnancy, unspecified, unspecified trimester: Secondary | ICD-10-CM

## 2024-01-04 DIAGNOSIS — Z8759 Personal history of other complications of pregnancy, childbirth and the puerperium: Secondary | ICD-10-CM

## 2024-01-04 DIAGNOSIS — D508 Other iron deficiency anemias: Secondary | ICD-10-CM

## 2024-01-04 DIAGNOSIS — Z3A22 22 weeks gestation of pregnancy: Secondary | ICD-10-CM

## 2024-01-04 DIAGNOSIS — Z8719 Personal history of other diseases of the digestive system: Secondary | ICD-10-CM

## 2024-01-04 DIAGNOSIS — R000 Tachycardia, unspecified: Secondary | ICD-10-CM

## 2024-01-04 DIAGNOSIS — D447 Neoplasm of uncertain behavior of aortic body and other paraganglia: Secondary | ICD-10-CM

## 2024-01-04 DIAGNOSIS — O99891 Other specified diseases and conditions complicating pregnancy: Secondary | ICD-10-CM

## 2024-01-04 DIAGNOSIS — M329 Systemic lupus erythematosus, unspecified: Secondary | ICD-10-CM

## 2024-01-04 DIAGNOSIS — F1191 Opioid use, unspecified, in remission: Secondary | ICD-10-CM

## 2024-01-05 ENCOUNTER — Encounter: Payer: Self-pay | Admitting: Obstetrics and Gynecology

## 2024-01-06 ENCOUNTER — Ambulatory Visit

## 2024-01-09 ENCOUNTER — Encounter: Payer: Self-pay | Admitting: Obstetrics and Gynecology

## 2024-01-09 DIAGNOSIS — R000 Tachycardia, unspecified: Secondary | ICD-10-CM | POA: Insufficient documentation

## 2024-01-12 ENCOUNTER — Ambulatory Visit (HOSPITAL_BASED_OUTPATIENT_CLINIC_OR_DEPARTMENT_OTHER)

## 2024-01-12 ENCOUNTER — Ambulatory Visit: Attending: Obstetrics and Gynecology | Admitting: Obstetrics and Gynecology

## 2024-01-12 ENCOUNTER — Other Ambulatory Visit: Payer: Self-pay | Admitting: *Deleted

## 2024-01-12 ENCOUNTER — Ambulatory Visit

## 2024-01-12 VITALS — BP 102/64 | HR 81

## 2024-01-12 DIAGNOSIS — O99342 Other mental disorders complicating pregnancy, second trimester: Secondary | ICD-10-CM | POA: Diagnosis not present

## 2024-01-12 DIAGNOSIS — M329 Systemic lupus erythematosus, unspecified: Secondary | ICD-10-CM | POA: Insufficient documentation

## 2024-01-12 DIAGNOSIS — O2622 Pregnancy care for patient with recurrent pregnancy loss, second trimester: Secondary | ICD-10-CM | POA: Diagnosis not present

## 2024-01-12 DIAGNOSIS — O09292 Supervision of pregnancy with other poor reproductive or obstetric history, second trimester: Secondary | ICD-10-CM | POA: Diagnosis not present

## 2024-01-12 DIAGNOSIS — O09899 Supervision of other high risk pregnancies, unspecified trimester: Secondary | ICD-10-CM | POA: Diagnosis not present

## 2024-01-12 DIAGNOSIS — Z98891 History of uterine scar from previous surgery: Secondary | ICD-10-CM

## 2024-01-12 DIAGNOSIS — Z79899 Other long term (current) drug therapy: Secondary | ICD-10-CM | POA: Insufficient documentation

## 2024-01-12 DIAGNOSIS — F1191 Opioid use, unspecified, in remission: Secondary | ICD-10-CM | POA: Diagnosis not present

## 2024-01-12 DIAGNOSIS — Z7982 Long term (current) use of aspirin: Secondary | ICD-10-CM | POA: Diagnosis not present

## 2024-01-12 DIAGNOSIS — O09299 Supervision of pregnancy with other poor reproductive or obstetric history, unspecified trimester: Secondary | ICD-10-CM

## 2024-01-12 DIAGNOSIS — Z362 Encounter for other antenatal screening follow-up: Secondary | ICD-10-CM

## 2024-01-12 DIAGNOSIS — Z363 Encounter for antenatal screening for malformations: Secondary | ICD-10-CM | POA: Insufficient documentation

## 2024-01-12 DIAGNOSIS — Z3A23 23 weeks gestation of pregnancy: Secondary | ICD-10-CM

## 2024-01-12 DIAGNOSIS — O99012 Anemia complicating pregnancy, second trimester: Secondary | ICD-10-CM | POA: Insufficient documentation

## 2024-01-12 DIAGNOSIS — O99340 Other mental disorders complicating pregnancy, unspecified trimester: Secondary | ICD-10-CM | POA: Insufficient documentation

## 2024-01-12 DIAGNOSIS — O34219 Maternal care for unspecified type scar from previous cesarean delivery: Secondary | ICD-10-CM | POA: Diagnosis not present

## 2024-01-12 DIAGNOSIS — D6862 Lupus anticoagulant syndrome: Secondary | ICD-10-CM

## 2024-01-12 DIAGNOSIS — Z8719 Personal history of other diseases of the digestive system: Secondary | ICD-10-CM

## 2024-01-12 DIAGNOSIS — Z8759 Personal history of other complications of pregnancy, childbirth and the puerperium: Secondary | ICD-10-CM

## 2024-01-12 DIAGNOSIS — O0990 Supervision of high risk pregnancy, unspecified, unspecified trimester: Secondary | ICD-10-CM

## 2024-01-12 DIAGNOSIS — O321XX0 Maternal care for breech presentation, not applicable or unspecified: Secondary | ICD-10-CM | POA: Diagnosis not present

## 2024-01-12 DIAGNOSIS — O99891 Other specified diseases and conditions complicating pregnancy: Secondary | ICD-10-CM

## 2024-01-12 DIAGNOSIS — O26892 Other specified pregnancy related conditions, second trimester: Secondary | ICD-10-CM | POA: Insufficient documentation

## 2024-01-12 DIAGNOSIS — O99324 Drug use complicating childbirth: Secondary | ICD-10-CM | POA: Insufficient documentation

## 2024-01-12 DIAGNOSIS — F419 Anxiety disorder, unspecified: Secondary | ICD-10-CM | POA: Insufficient documentation

## 2024-01-12 DIAGNOSIS — F1190 Opioid use, unspecified, uncomplicated: Secondary | ICD-10-CM | POA: Diagnosis not present

## 2024-01-12 NOTE — Progress Notes (Signed)
 Maternal-Fetal Medicine Consultation  Name: Felicia Davila  MRN: [id number]  GA: [id number] [redacted]w[redacted]d   Patient is here for fetal anatomy scan.  Her high risk pregnancy problems include: - Systemic lupus erythematosus (SLE).  Patient reports she had mild flares 6 months ago.  She defines flares flulike symptoms, swollen joints and butterfly rashes on the face.  Otherwise, she rarely has flares.  She does not take any medications.  We do not have information on anti-SSA and anti-SSB antibodies.  Patient does not have hypertension.  Blood pressure today at our office is 102/64 mmHg.  Patient takes low-dose aspirin  prophylaxis.  She has not recently seen a rheumatologist.  - Obstetric history is significant for a term and a preterm cesarean delivery.  Indication for her first cesarean section is unclear (?  Failure to progress in labor).  Her second pregnancy was complicated by cholestasis.Subsequently, she had 3 early spontaneous miscarriages.  - Anxiety.  Medications.  Patient takes fluoxetine  (Prozac ) 20 mg daily and alprazolam  (Xanax ) 1 mg 3 times daily.  - Opioid use disorder (OUD).  Patient takes buprenorphine  8 mg sublingually 3 times daily.  She reports no withdrawal symptoms.  - Anemia in pregnancy.  Patient takes iron  supplements.  Prenatal course: Pregnancy was dated by LMP date.  She reports she had irregular menstrual cycles.  She had first trimester ultrasound that gives EDD at 05/09/2024. On cell-free fetal DNA screening, the risks of fetal aneuploidy's are not increased.  Ultrasound We performed fetal anatomy scan. No makers of aneuploidies or fetal structural defects are seen. Fetal biometry is consistent with her previously-established dates. Amniotic fluid is normal and good fetal activity is seen. Fetal heart rate and rhythm appear normal.  Placenta is posterior and there is no evidence of previa or placenta accreta spectrum.  Patient understands the limitations of ultrasound in  detecting fetal anomalies.   We have assigned her EDD at 05/09/2024 based on her 7-week ultrasound.  Our concerns include: SLE in Pregnancy -Most women with SLE have good outcomes. In a few with active disease, complications are higher. -Maternal complications include flares including lupus nephritis, gestational diabetes and preeclampsia. -Some studies have reported increase in flares during pregnancy but it is unclear whether pregnancy influences the development of flares. -Fetal risks include spontaneous miscarriages, intrauterine fetal death, PPROM, preterm delivery, fetal growth restriction, placental abruption. Neonatal lupus and congenital heart block (rare) are seen in women with increased anti-SSA and anti-SSB antibodies. -Hydroxychloroquine (plaquenil) is safe in pregnancy and is likely to reduce the incidence of flares. I recommended that she take hydroxychloroquine in pregnancy. -Flares in pregnancy can be treated with azathioprine and/or tacrolimus, which are safe to use in pregnancy. Myophenolate Mofetil (MMF) is contraindicated in pregnancy. -If the patient is taking methotrexate  or lefunomide, they should be stopped 6 weeks before conception. Cholestyramine wash-out may be required with lefunomide. -Hypertension, if present, should be controlled with appropriate antihypertensives. -Preeclampsia is seen in about 15% to 35% women with SLE (7% to 10% in women without SLE). Low-dose aspirin  delays or prevents development of preeclampsia.  Patient takes low-dose aspirin  prophylaxis and encouraged her to continue aspirin  till delivery. I recommended screening for anti-SSA and anti-SSB antibodies.  I counseled the patient of the significance of these antibodies and if increased can lead to congenital heart block (1%) or neonatal lupus (transient skin manifestation).  Patient had a blood drawn today. I discussed ultrasound protocol in patients with SLE.  Previous cesarean delivery I  reassured the patient of  normal placental location and that there is no evidence of previa or placenta accreta spectrum.  Patient had questions about VBAC.  I discussed the benefits and risks of VBAC.  The risk of uterine scar dehiscence is about 1% with one previous cesarean delivery. The risk is only slightly increased (about 2%) in patients with more than one previous cesarean delivery. Cesarean deliveries increase the risks of hemorrhage, infection and venous thromboembolism.  Repeat cesarean deliveries increase the risks of placenta previa and/or placenta accreta spectrum. Long-term complications include small bowel obstruction.  Patient will discuss with her provider and decide on mode of delivery.  Medications Xanax  (alprazolam ) belongs to benzodiazepine class of drugs prescribed for anxiety. It is used as a sedative or tranquilizer. No increased congenital anomalies in the newborns are to be expected with the use of this drug in the first trimester. Use of this drug in third trimester can lead to neonatal withdrawal. In mothers who are breastfeeding while on alprazolam , the effect of this drug on the newborn is unknown. Patient to discuss with pediatrician on breastfeeding.  Fluoxetine  (Prozac ) It is a selective serotonin reuptake inhibitor (SSRI) used for the treatment of depression. It is not a major teratogen and congenital malformations are not expected to increase with this drug. A small absolute increase in primary pulmonary hypertension has been observed. However, untreated depression can be associated with adverse maternal outcomes that can override the above-mentioned risk. Patient was counseled on the small risk for primary pulmonary hypertension.  SSRIs have been associated with neonatal withdrawal syndrome. Fluoxetine  is excreted into breast milk and long-term effects on the infant are not known.  Opioid use disorder (OUD) I reassured the patient that buprenorphine  can be safely  taken in pregnancy and is likely to be associated with less withdrawal symptoms.  Neonatal abstinence syndrome can occur.  Recommendations -Consider taking hydroxychloroquine (Plaquenil) 200 mg daily. - Rheumatology consultation. - Patient had a blood drawn for anti-SSA and anti-SSB antibodies.  We will follow the results and communicate with the patient. - An appointment was made for her to return in 4 weeks for completion of fetal anatomy and fetal growth assessment. - Fetal growth assessments every 4 weeks until delivery. - Weekly antenatal testing from [redacted] weeks gestation until delivery. - Delivery at [redacted] weeks gestation.     Consultation including face-to-face (more than 50%) counseling 60 minutes.

## 2024-01-13 ENCOUNTER — Ambulatory Visit

## 2024-01-13 LAB — SJOGREN'S SYNDROME ANTIBODS(SSA + SSB)
ENA SSA (RO) Ab: <0.2 AI (ref 0.0–0.9)
ENA SSB (LA) Ab: <0.2 AI (ref 0.0–0.9)

## 2024-01-13 MED ORDER — IRON SUCROSE 20 MG/ML IV SOLN: 200.0000 mg | Freq: Once | INTRAVENOUS | Status: DC

## 2024-01-13 MED ORDER — SODIUM CHLORIDE 0.9 % IV BOLUS
250.0000 mL | Freq: Once | INTRAVENOUS | Status: DC
  Filled 2024-01-13: qty 250

## 2024-01-14 ENCOUNTER — Encounter: Payer: Self-pay | Admitting: Cardiology

## 2024-01-16 ENCOUNTER — Encounter (HOSPITAL_COMMUNITY): Payer: Self-pay

## 2024-01-16 ENCOUNTER — Other Ambulatory Visit (HOSPITAL_COMMUNITY): Payer: Self-pay

## 2024-01-16 ENCOUNTER — Telehealth: Payer: Self-pay | Admitting: Obstetrics and Gynecology

## 2024-01-16 NOTE — Telephone Encounter (Signed)
 I called the patient to discuss Anti-SSA and anti-SSB antibodies (not increased). Left a message for the patient to call our office back. Felicia Davila

## 2024-01-17 ENCOUNTER — Other Ambulatory Visit (HOSPITAL_COMMUNITY): Payer: Self-pay

## 2024-01-17 ENCOUNTER — Encounter (HOSPITAL_COMMUNITY): Payer: Self-pay

## 2024-01-17 ENCOUNTER — Other Ambulatory Visit: Payer: Self-pay

## 2024-01-18 ENCOUNTER — Other Ambulatory Visit (HOSPITAL_COMMUNITY): Payer: Self-pay

## 2024-01-18 ENCOUNTER — Other Ambulatory Visit: Payer: Self-pay

## 2024-01-18 ENCOUNTER — Ambulatory Visit

## 2024-01-18 MED ORDER — BUPRENORPHINE HCL 8 MG SL SUBL
8.0000 mg | SUBLINGUAL_TABLET | Freq: Three times a day (TID) | SUBLINGUAL | 0 refills | Status: DC
  Filled 2024-01-18: qty 90, 30d supply, fill #0
  Filled 2024-01-18: qty 75, 25d supply, fill #0
  Filled 2024-03-03: qty 15, 5d supply, fill #1

## 2024-01-19 ENCOUNTER — Encounter: Payer: Self-pay | Admitting: Obstetrics and Gynecology

## 2024-01-19 ENCOUNTER — Other Ambulatory Visit: Payer: Self-pay

## 2024-01-19 ENCOUNTER — Other Ambulatory Visit (HOSPITAL_COMMUNITY): Payer: Self-pay

## 2024-01-19 MED ORDER — FLUOXETINE HCL 20 MG PO TABS
20.0000 mg | ORAL_TABLET | Freq: Every day | ORAL | 0 refills | Status: DC
  Filled 2024-01-19 – 2024-02-06 (×3): qty 90, 90d supply, fill #0

## 2024-01-19 MED ORDER — ALPRAZOLAM 1 MG PO TABS
0.5000 mg | ORAL_TABLET | Freq: Four times a day (QID) | ORAL | 0 refills | Status: DC
  Filled 2024-01-19 – 2024-02-06 (×3): qty 105, 27d supply, fill #0

## 2024-01-19 MED ORDER — ALPRAZOLAM 1 MG PO TABS
0.5000 mg | ORAL_TABLET | Freq: Three times a day (TID) | ORAL | 0 refills | Status: DC | PRN
  Filled 2024-01-19 – 2024-03-03 (×2): qty 90, 30d supply, fill #0

## 2024-01-20 ENCOUNTER — Encounter: Payer: Self-pay | Admitting: Obstetrics and Gynecology

## 2024-01-20 ENCOUNTER — Ambulatory Visit: Admitting: *Deleted

## 2024-01-20 VITALS — BP 93/60 | HR 89 | Temp 97.8°F | Resp 16 | Ht 67.0 in | Wt 183.6 lb

## 2024-01-20 DIAGNOSIS — D508 Other iron deficiency anemias: Secondary | ICD-10-CM | POA: Diagnosis not present

## 2024-01-20 MED ORDER — IRON SUCROSE 20 MG/ML IV SOLN
200.0000 mg | Freq: Once | INTRAVENOUS | Status: AC
  Administered 2024-01-20: 200 mg via INTRAVENOUS
  Filled 2024-01-20: qty 10

## 2024-01-20 MED ORDER — SODIUM CHLORIDE 0.9 % IV BOLUS
250.0000 mL | Freq: Once | INTRAVENOUS | Status: AC
  Administered 2024-01-20: 250 mL via INTRAVENOUS
  Filled 2024-01-20: qty 250

## 2024-01-20 NOTE — Progress Notes (Signed)
 Diagnosis: Iron  Deficiency Anemia  Provider:  Mannam, Praveen MD  Procedure: IV Push  IV Type: Peripheral, IV Location: L Antecubital  Venofer  (Iron  Sucrose), Dose: 200 mg  Post Infusion IV Care: Observation period completed and Subcutaneous infusion needle discontinued  Discharge: Condition: Good, Destination: Home . AVS Declined  Performed by:  Rocky FORBES Sar, RN

## 2024-01-26 IMAGING — US US OB < 14 WEEKS - US OB TV
1 series · 15 of 28 positions shown · non-contrast
Comparison: 07/31/2021

CLINICAL DATA: Pelvic pain, left lower quadrant pain

EXAM:
OBSTETRIC <14 WK US AND TRANSVAGINAL OB US
TECHNIQUE: Both transabdominal and transvaginal ultrasound examinations were
performed for complete evaluation of the gestation as well as the
maternal uterus, adnexal regions, and pelvic cul-de-sac.
Transvaginal technique was performed to assess early pregnancy.

[Series 1: us ob < 14 weeks - us ob tv · 15 of 121 slices shown]
[im 1/121]
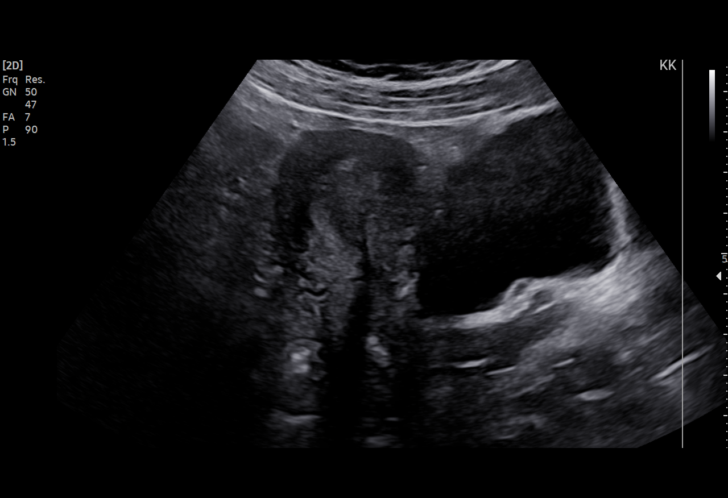
[im 9/121]
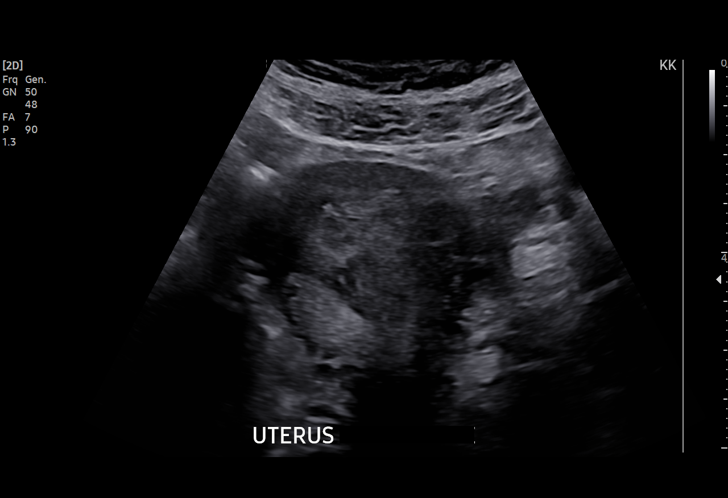
[im 18/121]
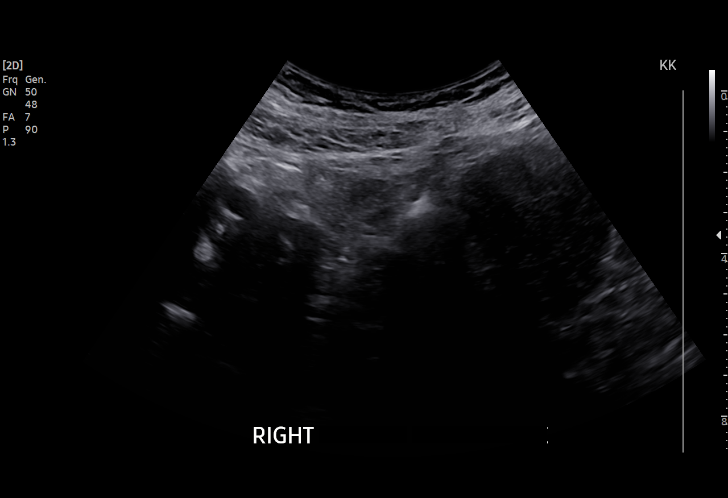
[im 27/121]
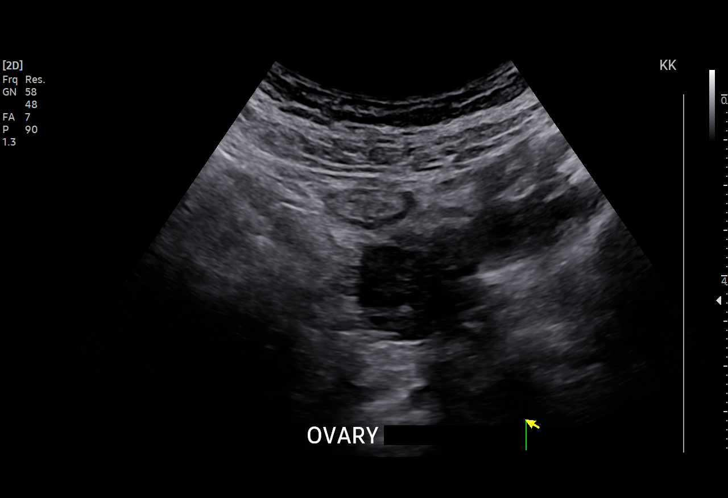
[im 36/121]
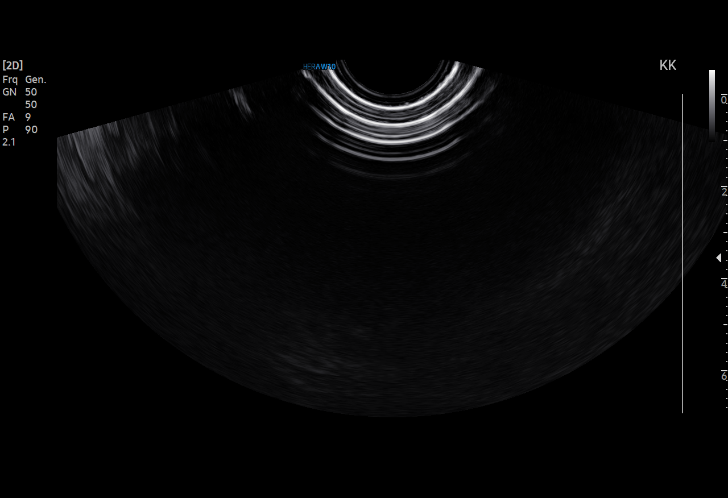
[im 45/121]
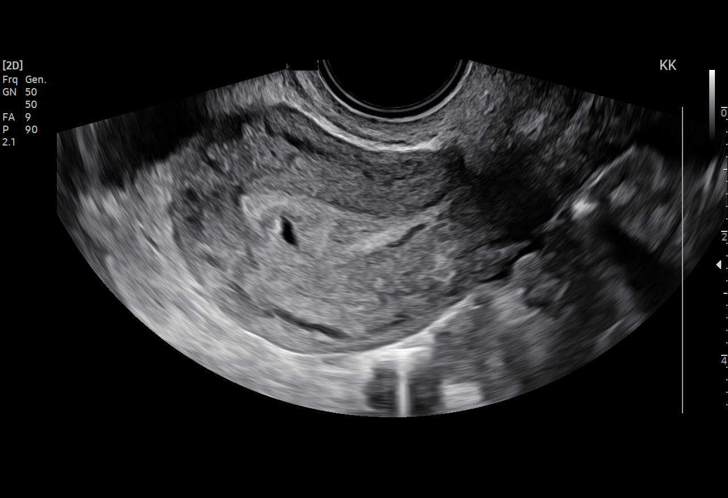
[im 54/121]
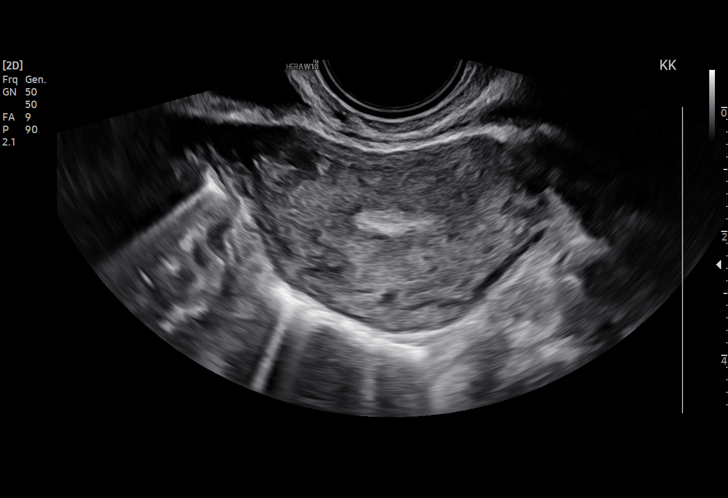
[im 63/121]
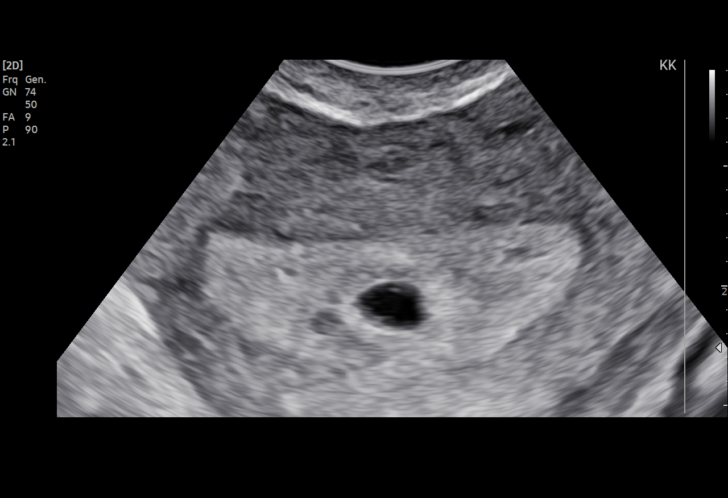
[im 67/121]
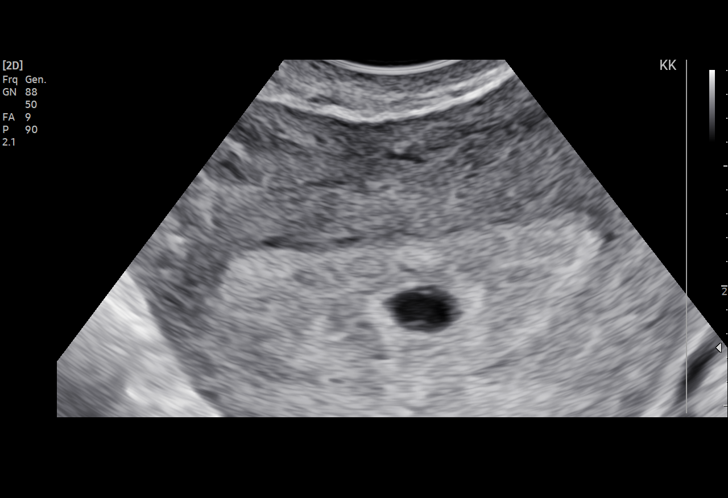
[im 76/121]
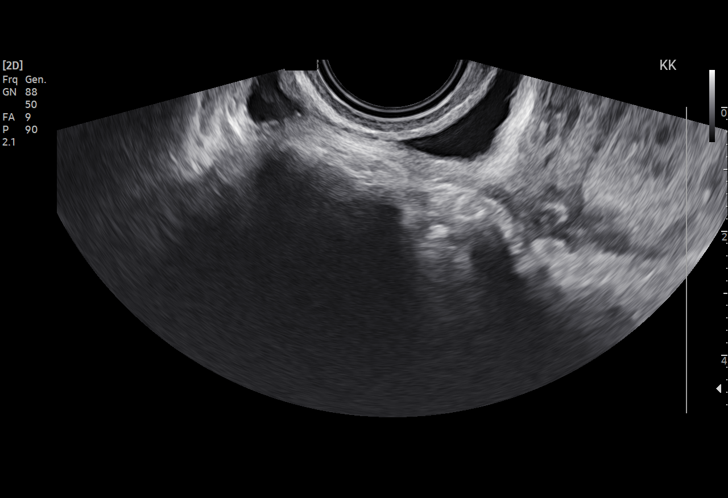
[im 85/121]
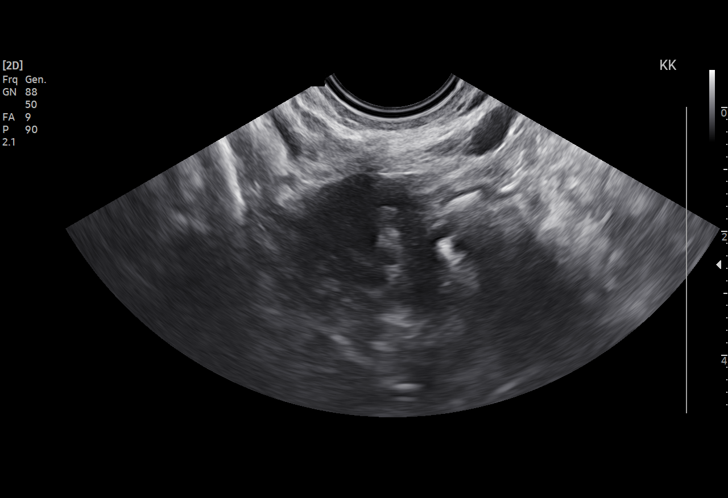
[im 94/121]
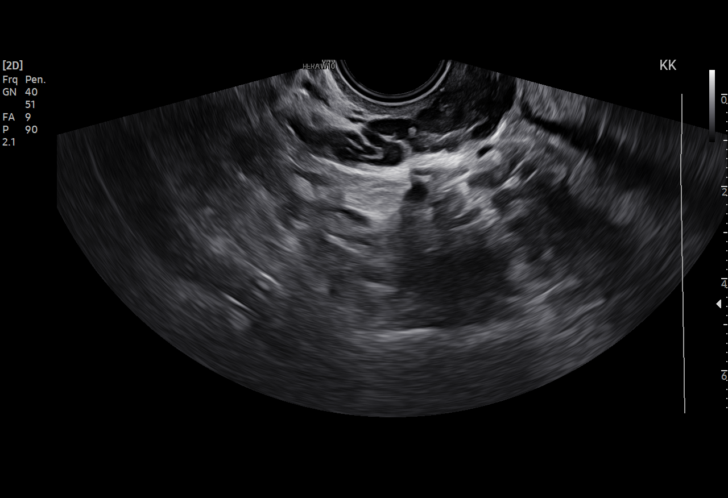
[im 103/121]
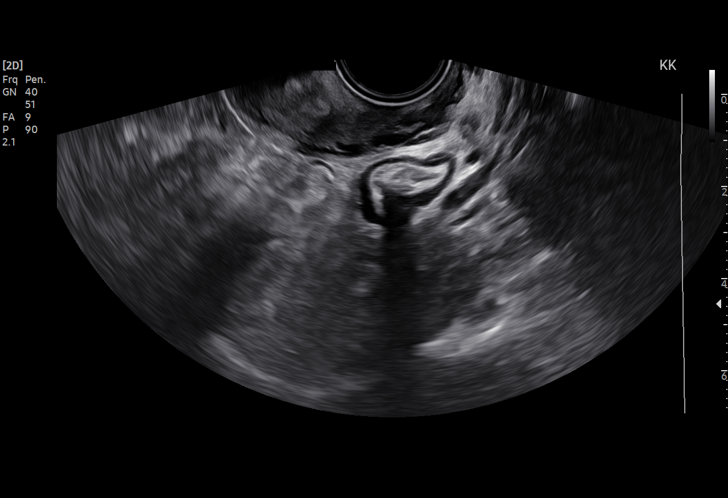
[im 112/121]
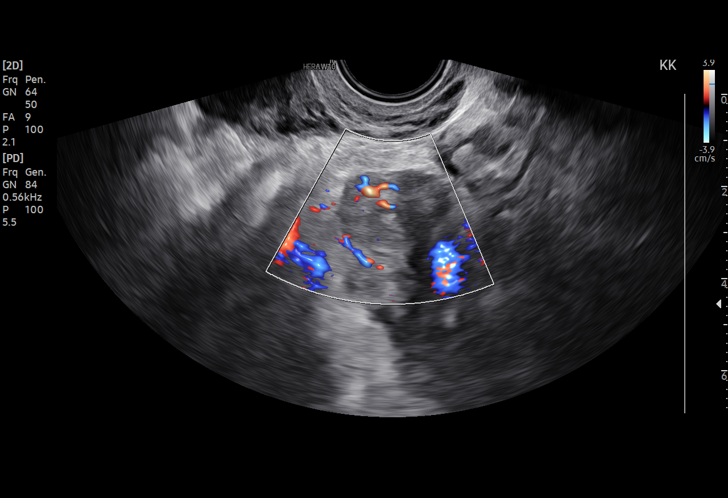
[im 121/121]
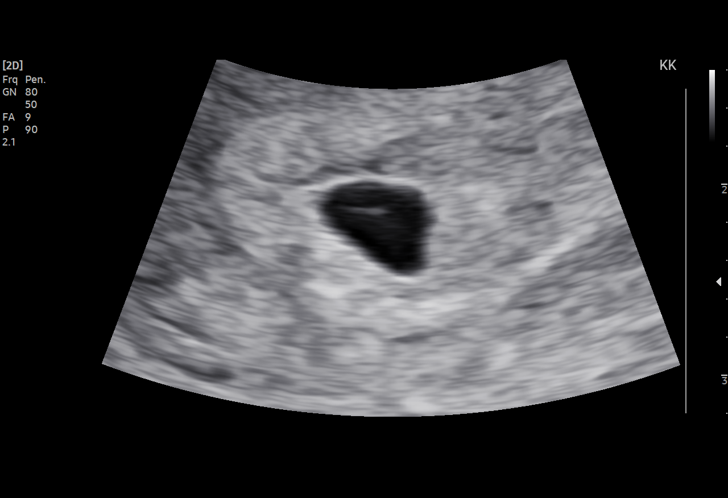

[15 of 28 positions shown; findings below may reference images not displayed]

FINDINGS: Intrauterine gestational sac: Single. There is mild lobulation in
the margin of the this fluid collection.

Yolk sac:  Seen

Embryo:  Not seen

Cardiac Activity: Not seen

MSD: 5.6 mm   5 w   2 d

Subchorionic hemorrhage:  None visualized.

Maternal uterus/adnexae: Unremarkable.
IMPRESSION: There is a gestational sac containing yolk sac within the uterus.
There is no demonstrable fetal pole or fetal cardiac activity.
Findings may suggest very early normal IUP or failed gestation with
incomplete abortion. Serial HCG estimations and follow-up sonogram
in 1-2 weeks may be considered.

There are no dominant adnexal masses. There is no free fluid in the
pelvis.

## 2024-01-27 ENCOUNTER — Ambulatory Visit

## 2024-01-27 MED ORDER — IRON SUCROSE 20 MG/ML IV SOLN: 200.0000 mg | Freq: Once | INTRAVENOUS | Status: DC

## 2024-01-27 MED ORDER — IRON SUCROSE 200 MG IVPB - SIMPLE MED: 200.0000 mg | Freq: Once | Status: DC

## 2024-01-27 MED ORDER — SODIUM CHLORIDE 0.9 % IV BOLUS
250.0000 mL | Freq: Once | INTRAVENOUS | Status: DC
  Filled 2024-01-27: qty 250

## 2024-01-30 ENCOUNTER — Other Ambulatory Visit (HOSPITAL_COMMUNITY): Payer: Self-pay

## 2024-02-01 ENCOUNTER — Other Ambulatory Visit: Payer: Self-pay | Admitting: Obstetrics & Gynecology

## 2024-02-01 NOTE — Progress Notes (Signed)
 Patient desires to get Venofer  infusion at another institution; this was ordered and will be faxed accordingly.   Gloris Hugger, MD

## 2024-02-03 ENCOUNTER — Other Ambulatory Visit (HOSPITAL_COMMUNITY)

## 2024-02-03 ENCOUNTER — Ambulatory Visit

## 2024-02-06 ENCOUNTER — Other Ambulatory Visit: Payer: Self-pay

## 2024-02-06 ENCOUNTER — Other Ambulatory Visit (HOSPITAL_COMMUNITY): Payer: Self-pay

## 2024-02-06 ENCOUNTER — Encounter: Payer: Self-pay | Admitting: Obstetrics and Gynecology

## 2024-02-07 NOTE — Progress Notes (Deleted)
   PRENATAL VISIT NOTE  Subjective:  Felicia Davila is a 33 y.o. [phone] at [redacted]w[redacted]d being seen today for ongoing prenatal care.  She is currently monitored for the following issues for this high-risk pregnancy and has Prior methotrexate  therapy; Glomus jugulare tumor (HCC); Murmur; Achilles tendon rupture, right, initial encounter; History of cesarean delivery; Supervision of high risk pregnancy, antepartum; Systemic lupus complicating pregnancy (HCC); Opioid use disorder in remission; History of cholestasis during pregnancy; Radiation exposure affecting pregnancy; Group B streptococcal bacteriuria; Hepatitis C antibody detected; Iron  deficiency anemia; and Tachycardia on their problem list.  Patient reports {[id number]}.   .  .   . Denies leaking of fluid.   The following portions of the patient's history were reviewed and updated as appropriate: allergies, current medications, past family history, past medical history, past social history, past surgical history and problem list.   Objective:  There were no vitals filed for this visit.  Fetal Status:           General:  Alert, oriented and cooperative. Patient is in no acute distress.  Skin: Skin is warm and dry. No rash noted.   Cardiovascular: Normal heart rate noted  Respiratory: Normal respiratory effort, no problems with respiration noted  Abdomen: Soft, gravid, appropriate for gestational age.         Assessment and Plan:  Pregnancy: [id number] at [redacted]w[redacted]d 1. Supervision of high risk pregnancy, antepartum (Primary) 2. [redacted] weeks gestation of pregnancy CBC, HIV, RPR, 2h GTT today Tdap discussed *** Flu shot discussed ***  3. Systemic lupus complicating pregnancy (HCC) No current meds - plaquenil recommended by MFM, pt *** Neg SSA/SSB Normal baseline preE labs ldASA Serial growth, antenatal testing per MFM ***Rheum referral   4. History of cesarean delivery Prior CS x 2 G1 G2   5. Tachycardia 6. Murmur 24h urine  catecholamines ordered, not yet collected *** Echo scheduled 10/20  7. Hepatitis C antibody detected Repeat VL today  8. Iron  deficiency anemia secondary to inadequate dietary iron  intake Mild, Hgb 10.9 w/ ferritin 11 on 8/22 S/p IV iron  x 2   9. Opioid use disorder in remission 10. Anxiety ***subutex  8mg  BID, prozac  20mg  daily, xanax  TID prn***discuss   10. Group B streptococcal bacteriuria PCN in labor  11. Glomus jugulare tumor (HCC) Normal brain MRI this pregnancy  Please refer to After Visit Summary for other counseling recommendations.   No follow-ups on file.  Future Appointments  Date Time Provider Department Center  02/08/2024  8:50 AM [name], MD [facility]  02/10/2024  9:15 AM [facility] PROVIDER 1 [facility]  02/10/2024  9:30 AM [facility] US1 [facility]  02/13/2024 12:45 PM [facility] 3 [facility]  03/09/2024  2:15 PM [facility] PROVIDER 1 [facility]  03/09/2024  2:30 PM [facility] US5 [facility]    Kieth BROCKS Erik, MD

## 2024-02-08 ENCOUNTER — Encounter: Admitting: Obstetrics and Gynecology

## 2024-02-08 ENCOUNTER — Encounter: Payer: Self-pay | Admitting: Obstetrics and Gynecology

## 2024-02-08 DIAGNOSIS — R8271 Bacteriuria: Secondary | ICD-10-CM

## 2024-02-08 DIAGNOSIS — R011 Cardiac murmur, unspecified: Secondary | ICD-10-CM

## 2024-02-08 DIAGNOSIS — F419 Anxiety disorder, unspecified: Secondary | ICD-10-CM

## 2024-02-08 DIAGNOSIS — Z98891 History of uterine scar from previous surgery: Secondary | ICD-10-CM

## 2024-02-08 DIAGNOSIS — D508 Other iron deficiency anemias: Secondary | ICD-10-CM

## 2024-02-08 DIAGNOSIS — R7689 Other specified abnormal immunological findings in serum: Secondary | ICD-10-CM

## 2024-02-08 DIAGNOSIS — F1191 Opioid use, unspecified, in remission: Secondary | ICD-10-CM

## 2024-02-08 DIAGNOSIS — Z3A27 27 weeks gestation of pregnancy: Secondary | ICD-10-CM

## 2024-02-08 DIAGNOSIS — O0990 Supervision of high risk pregnancy, unspecified, unspecified trimester: Secondary | ICD-10-CM

## 2024-02-08 DIAGNOSIS — M329 Systemic lupus erythematosus, unspecified: Secondary | ICD-10-CM

## 2024-02-08 DIAGNOSIS — R000 Tachycardia, unspecified: Secondary | ICD-10-CM

## 2024-02-08 DIAGNOSIS — D447 Neoplasm of uncertain behavior of aortic body and other paraganglia: Secondary | ICD-10-CM

## 2024-02-10 ENCOUNTER — Ambulatory Visit

## 2024-02-10 ENCOUNTER — Ambulatory Visit: Attending: Obstetrics and Gynecology | Admitting: Obstetrics and Gynecology

## 2024-02-10 ENCOUNTER — Other Ambulatory Visit: Payer: Self-pay | Admitting: *Deleted

## 2024-02-10 VITALS — BP 118/54 | HR 91

## 2024-02-10 DIAGNOSIS — O99891 Other specified diseases and conditions complicating pregnancy: Secondary | ICD-10-CM | POA: Diagnosis present

## 2024-02-10 DIAGNOSIS — M329 Systemic lupus erythematosus, unspecified: Secondary | ICD-10-CM

## 2024-02-10 DIAGNOSIS — O09299 Supervision of pregnancy with other poor reproductive or obstetric history, unspecified trimester: Secondary | ICD-10-CM

## 2024-02-10 DIAGNOSIS — Z362 Encounter for other antenatal screening follow-up: Secondary | ICD-10-CM | POA: Diagnosis not present

## 2024-02-10 DIAGNOSIS — F1120 Opioid dependence, uncomplicated: Secondary | ICD-10-CM

## 2024-02-10 DIAGNOSIS — F419 Anxiety disorder, unspecified: Secondary | ICD-10-CM | POA: Insufficient documentation

## 2024-02-10 DIAGNOSIS — O09291 Supervision of pregnancy with other poor reproductive or obstetric history, first trimester: Secondary | ICD-10-CM | POA: Diagnosis present

## 2024-02-10 DIAGNOSIS — O2622 Pregnancy care for patient with recurrent pregnancy loss, second trimester: Secondary | ICD-10-CM

## 2024-02-10 DIAGNOSIS — O99342 Other mental disorders complicating pregnancy, second trimester: Secondary | ICD-10-CM

## 2024-02-10 DIAGNOSIS — O34219 Maternal care for unspecified type scar from previous cesarean delivery: Secondary | ICD-10-CM | POA: Insufficient documentation

## 2024-02-10 DIAGNOSIS — F1191 Opioid use, unspecified, in remission: Secondary | ICD-10-CM

## 2024-02-10 DIAGNOSIS — Z98891 History of uterine scar from previous surgery: Secondary | ICD-10-CM

## 2024-02-10 DIAGNOSIS — O26892 Other specified pregnancy related conditions, second trimester: Secondary | ICD-10-CM | POA: Diagnosis not present

## 2024-02-10 DIAGNOSIS — Z79899 Other long term (current) drug therapy: Secondary | ICD-10-CM | POA: Insufficient documentation

## 2024-02-10 DIAGNOSIS — Z3A27 27 weeks gestation of pregnancy: Secondary | ICD-10-CM | POA: Insufficient documentation

## 2024-02-10 DIAGNOSIS — O09292 Supervision of pregnancy with other poor reproductive or obstetric history, second trimester: Secondary | ICD-10-CM | POA: Diagnosis not present

## 2024-02-10 DIAGNOSIS — O99322 Drug use complicating pregnancy, second trimester: Secondary | ICD-10-CM | POA: Diagnosis not present

## 2024-02-10 NOTE — Progress Notes (Signed)
 Maternal-Fetal Medicine Consultation  Name: Felicia Davila  MRN: [id number]  GA: [id number] [redacted]w[redacted]d   Patient is here for fetal growth assessment. -Systemic lupus erythematosus.  Anti-SSA and anti-SSB antibodies are not increased.  She has not had any flares in this pregnancy. Patient reports she reduced buprenorphine  dosage to 4 mg tid and feels well.  Ultrasound Normal fetal growth and amniotic fluid.  Cephalic presentation.  Fetal heart rate and rhythm appear normal. I reassured the patient of the findings.  We discussed hydroxychloroquine (Plaquenil) treatment and the patient is planning to take hydroxychloroquine. I discussed the importance of antenatal testing beginning at [redacted] weeks gestation.  Recommendations - Weekly antenatal testing from [redacted] weeks gestation until delivery.     Consultation including face-to-face (more than 50%) counseling 10 minutes.

## 2024-02-13 ENCOUNTER — Ambulatory Visit (HOSPITAL_COMMUNITY): Admission: RE | Admit: 2024-02-13 | Source: Ambulatory Visit | Attending: Cardiology | Admitting: Cardiology

## 2024-02-13 ENCOUNTER — Telehealth (HOSPITAL_COMMUNITY): Payer: Self-pay | Admitting: Cardiology

## 2024-02-13 NOTE — Telephone Encounter (Signed)
 02/13/24 Patient  NO SHOWED  ECHOCARDIOGRAM x 2- We will not call patient to reschedule due to NO SHOW RATE and NO SHOW Policy. If patient calls back to reschedule we will reinstate the order.  Thank you.

## 2024-02-14 ENCOUNTER — Telehealth: Payer: Self-pay

## 2024-02-14 NOTE — Telephone Encounter (Signed)
 RN spoke with Rocky at the Infusion Center at [facility] to see if patient could switch her iron  infusions to [facility] per the patient request. Rocky reported the orders could be transferred to [facility] or [place]. Patient notified.  Silvano LELON Piano, RN

## 2024-02-15 ENCOUNTER — Other Ambulatory Visit (HOSPITAL_COMMUNITY): Payer: Self-pay

## 2024-02-15 ENCOUNTER — Telehealth: Payer: Self-pay

## 2024-02-15 ENCOUNTER — Other Ambulatory Visit: Payer: Self-pay

## 2024-02-15 ENCOUNTER — Encounter (HOSPITAL_COMMUNITY): Payer: Self-pay | Admitting: Obstetrics and Gynecology

## 2024-02-15 ENCOUNTER — Other Ambulatory Visit (HOSPITAL_COMMUNITY): Payer: Self-pay | Admitting: Pharmacy Technician

## 2024-02-15 ENCOUNTER — Encounter (HOSPITAL_COMMUNITY): Payer: Self-pay

## 2024-02-15 MED ORDER — BUPRENORPHINE HCL 8 MG SL SUBL
8.0000 mg | SUBLINGUAL_TABLET | Freq: Three times a day (TID) | SUBLINGUAL | 1 refills | Status: DC
  Filled 2024-02-15: qty 67, 23d supply, fill #0
  Filled 2024-02-15: qty 23, 7d supply, fill #0
  Filled 2024-02-15: qty 90, 30d supply, fill #0
  Filled 2024-03-14 – 2024-03-16 (×3): qty 90, 30d supply, fill #1

## 2024-02-16 ENCOUNTER — Other Ambulatory Visit (HOSPITAL_COMMUNITY): Payer: Self-pay

## 2024-02-16 ENCOUNTER — Other Ambulatory Visit

## 2024-02-16 ENCOUNTER — Ambulatory Visit: Admitting: Obstetrics and Gynecology

## 2024-02-16 ENCOUNTER — Encounter: Payer: Self-pay | Admitting: Obstetrics and Gynecology

## 2024-02-16 VITALS — BP 119/80 | HR 97 | Wt 187.1 lb

## 2024-02-16 DIAGNOSIS — Z3A28 28 weeks gestation of pregnancy: Secondary | ICD-10-CM

## 2024-02-16 DIAGNOSIS — O0990 Supervision of high risk pregnancy, unspecified, unspecified trimester: Secondary | ICD-10-CM

## 2024-02-16 DIAGNOSIS — D447 Neoplasm of uncertain behavior of aortic body and other paraganglia: Secondary | ICD-10-CM

## 2024-02-16 DIAGNOSIS — R000 Tachycardia, unspecified: Secondary | ICD-10-CM

## 2024-02-16 DIAGNOSIS — F1191 Opioid use, unspecified, in remission: Secondary | ICD-10-CM

## 2024-02-16 DIAGNOSIS — R011 Cardiac murmur, unspecified: Secondary | ICD-10-CM

## 2024-02-16 DIAGNOSIS — O99891 Other specified diseases and conditions complicating pregnancy: Secondary | ICD-10-CM

## 2024-02-16 DIAGNOSIS — D508 Other iron deficiency anemias: Secondary | ICD-10-CM

## 2024-02-16 DIAGNOSIS — F419 Anxiety disorder, unspecified: Secondary | ICD-10-CM

## 2024-02-16 DIAGNOSIS — M329 Systemic lupus erythematosus, unspecified: Secondary | ICD-10-CM

## 2024-02-16 MED ORDER — HYDROXYCHLOROQUINE SULFATE 200 MG PO TABS
200.0000 mg | ORAL_TABLET | Freq: Every day | ORAL | 11 refills | Status: DC
  Filled 2024-02-16 – 2024-03-03 (×2): qty 30, 30d supply, fill #0

## 2024-02-16 NOTE — Progress Notes (Signed)
 PRENATAL VISIT NOTE  Subjective:  Felicia Davila is a 33 y.o. [phone] at [redacted]w[redacted]d being seen today for ongoing prenatal care.  She is currently monitored for the following issues for this high-risk pregnancy and has Prior methotrexate  therapy; Glomus jugulare tumor (HCC); Murmur; Achilles tendon rupture, right, initial encounter; History of cesarean delivery; Supervision of high risk pregnancy, antepartum; Systemic lupus complicating pregnancy (HCC); Opioid use disorder in remission; History of cholestasis during pregnancy; Radiation exposure affecting pregnancy; Group B streptococcal bacteriuria; Hepatitis C antibody detected; Iron  deficiency anemia; and Tachycardia on their problem list.  Patient reports doing OK overall. Notes more pregnancy related pains.  Contractions: Not present. Vag. Bleeding: None.  Movement: Present. Denies leaking of fluid.   The following portions of the patient's history were reviewed and updated as appropriate: allergies, current medications, past family history, past medical history, past social history, past surgical history and problem list.   Objective:   Vitals:   02/16/24 1017  BP: 119/80  Pulse: 97  Weight: 187 lb 1.9 oz (84.9 kg)   Fetal Status: Fetal Heart Rate (bpm): 135   Movement: Present     General:  Alert, oriented and cooperative. Patient is in no acute distress.  Skin: Skin is warm and dry. No rash noted.   Cardiovascular: Normal heart rate noted  Respiratory: Normal respiratory effort, no problems with respiration noted  Abdomen: Soft, gravid, appropriate for gestational age.  Pain/Pressure: Absent      Assessment and Plan:  Pregnancy: [id number] at [redacted]w[redacted]d 1. Supervision of high risk pregnancy, antepartum (Primary) 2. [redacted] weeks gestation of pregnancy CBC, HIV, RPR, 2h GTT today Tdap & flu shot recommended, she is considering Considering LARC vs tubal for contraception. Planned to sign tubal papers today but needed to return to lab for  blood draw before they could be signed  3. Systemic lupus complicating pregnancy (HCC) Patient agreeable to start plaquenil - ordered Understands she needs baseline eye exam Neg SSA/SSB Normal baseline preE labs ldASA Serial growth - @ 27/2 981g (20%), AC 31%; next scheduled 11/24 Weekly antenatal testing at 32 weeks Delivery ~ 39 weeks Rheum referral placed  4. History of cesarean delivery Prior CS x 2 G1 failed IOL ~3-4cm G2 elective repeat Thinks this will be her last pregnancy. Discussed option for permanent sterilization at time of repeat CS. She is considering this or IUD placement.  For now, plans repeat. Requests to be scheduled with myself or Duncan  5. Tachycardia 6. Murmur 24h urine catecholamines ordered, not yet collected Pt missed echo and due to no show policy, will not be rescheduled unless she calls. Encouraged patient to call ASAP so we can complete echo, zio patch and cardiology consult   7. Hepatitis C antibody detected Repeat VL next visit  8. Iron  deficiency anemia secondary to inadequate dietary iron  intake Mild, Hgb 10.9 w/ ferritin 11 on 8/22 S/p IV iron  x 2, has 3 more doses scheduled Repeat CBC at 32-34 weeks  9. Opioid use disorder in remission 10. Anxiety - She has self tapered to subutex  4mg  TID - Initially she was considering self-discontinuing close to delivery due to fears around medical provider bias - Strongly recommend she continue subutex  through pregnancy and postpartum. Discussed significantly higher maternal & fetal risk with discontinuation of subutex  due to risk of relapse. She expressed her understanding. - Continues on prozac  and xanax  TID -- has been on chronic benzodiazepines for a decade or more. Despite neonatal risks, maternal/fetal risks are likely higher with  an attempt at taper/discontinuation  - REACH consult x 1 - message routed to scheduling team to coordinate  10. Group B streptococcal bacteriuria  11. Glomus  jugulare tumor (HCC) Normal brain MRI this pregnancy  Please refer to After Visit Summary for other counseling recommendations.   Return in about 2 weeks (around 03/01/2024) for return OB at 30 weeks.  Future Appointments  Date Time Provider Department Center  02/17/2024  2:00 PM [facility] None  02/24/2024  2:00 PM [facility] None  03/02/2024  2:00 PM [facility] None  03/19/2024  1:15 PM [facility] PROVIDER 1 [facility]  03/19/2024  1:30 PM [facility] US2 [facility]  03/26/2024  1:15 PM [facility] PROVIDER 1 [facility]  03/26/2024  1:30 PM [facility] US4 [facility]  04/02/2024  1:15 PM [facility] PROVIDER 1 [facility]  04/02/2024  1:30 PM [facility] US2 [facility]  04/09/2024  2:15 PM [facility] PROVIDER 1 [facility]  04/09/2024  2:30 PM [facility] US3 [facility]  04/16/2024  1:15 PM [facility] PROVIDER 1 [facility]  04/16/2024  1:30 PM [facility] US1 [facility]    Kieth JAYSON Carolin, MD

## 2024-02-17 ENCOUNTER — Ambulatory Visit

## 2024-02-17 ENCOUNTER — Ambulatory Visit: Payer: Self-pay | Admitting: Obstetrics and Gynecology

## 2024-02-17 ENCOUNTER — Inpatient Hospital Stay (HOSPITAL_COMMUNITY): Admission: RE | Admit: 2024-02-17 | Source: Ambulatory Visit

## 2024-02-17 DIAGNOSIS — O0990 Supervision of high risk pregnancy, unspecified, unspecified trimester: Secondary | ICD-10-CM

## 2024-02-17 LAB — GLUCOSE TOLERANCE, 2 HOURS W/ 1HR
Glucose, 1 hour: 95 mg/dL (ref 70–179)
Glucose, 2 hour: 83 mg/dL (ref 70–152)
Glucose, Fasting: 76 mg/dL (ref 70–91)

## 2024-02-17 LAB — CBC
Hematocrit: 38.3 % (ref 34.0–46.6)
Hemoglobin: 12.1 g/dL (ref 11.1–15.9)
MCH: 29.3 pg (ref 26.6–33.0)
MCHC: 31.6 g/dL (ref 31.5–35.7)
MCV: 93 fL (ref 79–97)
Platelets: 212 x10E3/uL (ref 150–450)
RBC: 4.13 x10E6/uL (ref 3.77–5.28)
RDW: 12 % (ref 11.7–15.4)
WBC: 5.9 x10E3/uL (ref 3.4–10.8)

## 2024-02-17 LAB — HIV ANTIBODY (ROUTINE TESTING W REFLEX): HIV Screen 4th Generation wRfx: NONREACTIVE

## 2024-02-17 LAB — RPR: RPR Ser Ql: NONREACTIVE

## 2024-02-17 NOTE — Telephone Encounter (Signed)
 Error

## 2024-02-20 ENCOUNTER — Encounter (HOSPITAL_COMMUNITY): Payer: Self-pay
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
## 2024-02-21 ENCOUNTER — Telehealth: Payer: Self-pay

## 2024-02-21 ENCOUNTER — Encounter (HOSPITAL_COMMUNITY): Payer: Self-pay | Admitting: Obstetrics & Gynecology

## 2024-02-21 ENCOUNTER — Telehealth (HOSPITAL_COMMUNITY): Payer: Self-pay | Admitting: Pharmacy Technician

## 2024-02-21 ENCOUNTER — Inpatient Hospital Stay (HOSPITAL_COMMUNITY)
Admission: AD | Admit: 2024-02-21 | Discharge: 2024-02-21 | Discharge status: Against Medical Advice | Attending: Obstetrics & Gynecology | Admitting: Obstetrics & Gynecology

## 2024-02-21 ENCOUNTER — Encounter (HOSPITAL_COMMUNITY): Payer: Self-pay | Admitting: Obstetrics and Gynecology

## 2024-02-21 ENCOUNTER — Ambulatory Visit

## 2024-02-21 DIAGNOSIS — S0990XA Unspecified injury of head, initial encounter: Secondary | ICD-10-CM | POA: Diagnosis present

## 2024-02-21 DIAGNOSIS — O9A213 Injury, poisoning and certain other consequences of external causes complicating pregnancy, third trimester: Secondary | ICD-10-CM | POA: Diagnosis present

## 2024-02-21 DIAGNOSIS — O26893 Other specified pregnancy related conditions, third trimester: Secondary | ICD-10-CM | POA: Diagnosis not present

## 2024-02-21 DIAGNOSIS — R519 Headache, unspecified: Secondary | ICD-10-CM | POA: Insufficient documentation

## 2024-02-21 DIAGNOSIS — Z3A28 28 weeks gestation of pregnancy: Secondary | ICD-10-CM | POA: Diagnosis not present
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
## 2024-02-21 NOTE — Telephone Encounter (Signed)
 Auth Submission: NO AUTH NEEDED Site of care: [facility] IN[facility] Payer: [place] [employer]ICA[employer] Medication & CPT/J Code(s) submitted: Venofer  (Iron  Sucrose) J1756 Diagnosis Code: D50.8 Route of submission (phone, fax, portal):  Phone # Fax # Auth type: Buy/Bill HB Units/visits requested: 200MG  X 5 DOSES  Reference number:  Approval from: 12/23/23 to 04/25/24  SOC change per patient request.    Dagoberto Armour, CPhT [facility] Phone: [phone] 02/21/2024

## 2024-02-21 NOTE — Progress Notes (Deleted)
 Office Visit Note  Patient: Felicia Davila             Date of Birth: 14-Aug-1990           MRN: [id number]             PCP: Pcp, No Referring: [name], MD Visit Date: 02/21/2024 Occupation: @GUAROCC @  Subjective:  No chief complaint on file.    History of Present Illness: Felicia Davila is a 33 y.o. female ***   Activities of Daily Living:  Patient reports morning stiffness for *** {minute/hour:19697}.   Patient {ACTIONS;DENIES/REPORTS:21021675::Denies} nocturnal pain.  Difficulty dressing/grooming: {ACTIONS;DENIES/REPORTS:21021675::Denies} Difficulty climbing stairs: {ACTIONS;DENIES/REPORTS:21021675::Denies} Difficulty getting out of chair: {ACTIONS;DENIES/REPORTS:21021675::Denies} Difficulty using hands for taps, buttons, cutlery, and/or writing: {ACTIONS;DENIES/REPORTS:21021675::Denies}  No Rheumatology ROS completed.    Rheum History: # Diagnosed in ***.  Manifestation of disease:   Serologies: (+) *** (-) ***  Maintenance Labs: QuantiFERON: *** Hepatitis panel: ***  Current Treatment ***  Prior Treatments ***   PMFS History:  Patient Active Problem List   Diagnosis Date Noted   Anxiety 02/16/2024   Tachycardia 01/09/2024   Iron  deficiency anemia 12/22/2023   Group B streptococcal bacteriuria 11/14/2023   Hepatitis C antibody detected 11/14/2023   History of cesarean delivery 11/10/2023   Supervision of high risk pregnancy, antepartum 11/10/2023   Opioid use disorder in remission 11/10/2023   History of cholestasis during pregnancy 11/10/2023   Radiation exposure affecting pregnancy 11/10/2023   Systemic lupus complicating pregnancy (HCC)    Achilles tendon rupture, right, initial encounter 10/19/2022   Prior methotrexate  therapy 07/28/2021   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   Murmur 09/17/2015    Past Medical History:  Diagnosis Date   Anxiety    Depression    Fibroid    Lupus    PONV (postoperative  nausea and vomiting)    UTI (urinary tract infection)     Family History  Problem Relation Age of Onset   Cancer Mother        Small Cell Lung Cancer   Lymphoma Mother    Clotting disorder Mother    Lupus Father    COPD Father    Autism spectrum disorder Son    ADD / ADHD Son    Cancer Paternal Grandfather    Past Surgical History:  Procedure Laterality Date   CESAREAN SECTION     CHOLECYSTECTOMY     DILATION AND EVACUATION N/A 11/06/2021   Procedure: DILATATION AND EVACUATION;  Surgeon: Alger Gong, MD;  Location: [facility] OR;  Service: Gynecology;  Laterality: N/A;   FOREARM SURGERY Left    HYSTEROSCOPY WITH D & C  12/14/2021   Procedure: DILATATION AND CURETTAGE /HYSTEROSCOPY;  Surgeon: Zina Jerilynn LABOR, MD;  Location: [facility] OR;  Service: Gynecology;;   Social History   Social History Narrative   Not on file   Immunization History  Administered Date(s) Administered   DTP 10/23/1991, 02/11/1992, 04/29/1992, 08/04/1994   DTaP / HiB 11/26/1992   HIB, Unspecified 11/26/1992   Hep B, Unspecified 10/23/1991, 02/11/1992, 08/04/1994   Hepatitis B 10/23/1991, 02/11/1992, 08/04/1994   MMR 11/26/1992, 04/09/2008   OPV 10/23/1991, 02/11/1992, 04/29/1992, 08/04/1994   Tdap 04/09/2008   Varicella 07/01/2008     Objective: Vital Signs: LMP 07/29/2023 Comment: got pregnant on the pill.  hadn't been having periods  had spotting 2 wks ago and again on Thur.   Physical Exam Vitals and nursing note reviewed.  HENT:     Head: Normocephalic and atraumatic.  Nose: Nose normal.  Eyes:     Conjunctiva/sclera: Conjunctivae normal.     Pupils: Pupils are equal, round, and reactive to light.  Cardiovascular:     Rate and Rhythm: Normal rate and regular rhythm.     Heart sounds: Normal heart sounds.  Pulmonary:     Effort: Pulmonary effort is normal.     Breath sounds: Normal breath sounds.  Skin:    General: Skin is warm and dry.  Neurological:     Mental Status: She is alert.  Mental status is at baseline.  Psychiatric:        Mood and Affect: Mood normal.        Behavior: Behavior normal.      Musculoskeletal Exam: ***  CDAI Exam: CDAI Score: -- Patient Global: --; Provider Global: -- Swollen: --; Tender: -- Joint Exam 02/21/2024   No joint exam has been documented for this visit   There is currently no information documented on the homunculus. Go to the Rheumatology activity and complete the homunculus joint exam.  Investigation: No additional findings.  Imaging: US  MFM OB FOLLOW UP Result Date: 02/10/2024 ----------------------------------------------------------------------  OBSTETRICS REPORT                       (Signed Final 02/10/2024 01:46 pm) ---------------------------------------------------------------------- Patient Info  ID #:       [id number]                          D.O.B.:  1990-06-07 (33 yrs)(F)  Name:       Felicia Davila               Visit Date: 02/10/2024 09:34 am ---------------------------------------------------------------------- Performed By  Attending:        Fredia Fresh MD        Ref. Address:     [address] [place]  Performed By:     Comer Harrow       [facility]                                                             [facility]  Referred By:      RANEE Lofts ---------------------------------------------------------------------- Orders  #  Description                           Code        Ordered By  1  US  MFM OB FOLLOW UP                   23183.98    FREDIA FRESH ----------------------------------------------------------------------  #  Order #                     Accession #                Episode #  1  [id number]                   [id number]                 [id number]  ---------------------------------------------------------------------- Indications  Systemic lupus complicating pregnancy,         O26.892, M32.9  second trimester  Suboxone  use                                   O99.320  Poor obstetric history-Recurrent (habitual)    O26.20  abortion (3 consecutive SAB's)  History of cesarean delivery, currently        O34.219  pregnant x2  Anxiety during pregnancy, second trimester     O99.342, F41.9  (xanax  and prozac )  Poor obstetrical history (cholestasis)         O09.299  Antenatal follow-up for nonvisualized fetal    Z36.2  anatomy  [redacted] weeks gestation of pregnancy                Z3A.27  LR NIPS - Female, Negative Horizon,  Negative AFP ---------------------------------------------------------------------- Vital Signs  BP:          118/54 ---------------------------------------------------------------------- Fetal Evaluation  Num Of Fetuses:         1  Fetal Heart Rate(bpm):  142  Cardiac Activity:       Observed  Presentation:           Cephalic  Placenta:               Anterior  P. Cord Insertion:      Previously seen  Amniotic Fluid  AFI FV:      Subjectively upper-normal  AFI Sum(cm)     %Tile       Largest Pocket(cm)  23.33           96          7.04  RUQ(cm)       RLQ(cm)       LUQ(cm)        LLQ(cm)  4.25          7.04          6.57           5.47 ---------------------------------------------------------------------- Biometry  BPD:      68.6  mm     G. Age:  27w 4d         49  %    CI:        73.02   %    70 - 86  FL/HC:      18.9   %    18.6 - 20.4  HC:      255.2  mm     G. Age:  27w 5d         35  %    HC/AC:      1.13        1.05 - 1.21  AC:      225.2  mm     G. Age:  26w 6d         31  %    FL/BPD:     70.3   %    71 - 87  FL:       48.2  mm     G. Age:  26w 1d         10  %    FL/AC:      21.4   %    20 - 24  CER:      30.7  mm     G. Age:  26w 5d         47  %  LV:         12  mm  Est. FW:     981  gm      2  lb 3 oz     20  % ---------------------------------------------------------------------- OB History  Gravidity:    8         Term:   1        Prem:   1        SAB:   3  TOP:          1       Ectopic:  1        Living: 2 ---------------------------------------------------------------------- Gestational Age  LMP:           28w 0d        Date:  07/29/23                   EDD:   05/04/24  U/S Today:     27w 1d                                        EDD:   05/10/24  Best:          27w 2d     Det. By:  Early Ultrasound         EDD:   05/09/24                                      (09/22/23) ---------------------------------------------------------------------- Targeted Anatomy  Central Nervous System  Calvarium/Cranial V.:  Appears normal         Cereb./Vermis:          Appears normal  Cavum:                 Appears normal         Cisterna Magna:         Appears normal  Lateral Ventricles:    Appears normal         Midline Falx:           Appears normal  Choroid Plexus:        Previously seen  Spine  Cervical:  Previously seen        Sacral:                 Previously seen  Thoracic:              Previously seen        Shape/Curvature:        Previously seen  Lumbar:                Previously seen  Head/Neck  Lips:                  Previously seen        Profile:                Appears normal  Neck:                  Previously seen        Orbits/Eyes:            Previously seen  Nuchal Fold:           Previously seen        Mandible:               Previously seen  Nasal Bone:            Present                Maxilla:                Previously seen  Thorax  4 Chamber View:        Appears normal         Interventr. Septum:     Appears normal  Cardiac Rhythm:        Normal                 Cardiac Axis:           Previously seen  Cardiac Situs:         Previously seen        Diaphragm:              Appears normal  Rt Outflow Tract:      Previously seen        3 Vessel View:          Appears normal  Lt Outflow Tract:       Previously seen        3 V Trachea View:       Previously seen  Aortic Arch:           Previously seen        IVC:                    Previously seen  Ductal Arch:           Previously seen        Crossing:               Previously seen  SVC:                   Previously seen  Abdomen  Ventral Wall:          Previously seen        Lt Kidney:              Appears normal  Cord Insertion:        Previously seen        Rt Kidney:  Appears normal  Situs:                 Previously seen        Bladder:                Appears normal  Stomach:               Appears normal  Extremities  Lt Humerus:            Previously seen        Lt Femur:               Previously seen  Rt Humerus:            Previously seen        Rt Femur:               Previously seen  Lt Forearm:            Previously seen        Lt Lower Leg:           Previously seen  Rt Forearm:            Previously seen        Rt Lower Leg:           Previously seen  Lt Hand:               Previously seen        Lt Foot:                Previously seen  Rt Hand:               Previously seen        Rt Foot:                Previously seen  Other  Umbilical Cord:        Previously seen        Genitalia:              Female prev seen  Comment:     Fetal anatomic survey complete. ---------------------------------------------------------------------- Cervix Uterus Adnexa  Cervix  Not visualized (advanced GA >24wks)  Uterus  No abnormality visualized.  Right Ovary  Size(cm)     2.88   x   1.69   x  1.75      Vol(ml): 4.46  Within normal limits.  Left Ovary  Not visualized.  Cul De Sac  No free fluid seen.  Adnexa  No adnexal mass visualized ---------------------------------------------------------------------- Impression  Patient is here for fetal growth assessment.  -Systemic lupus erythematosus.  Anti-SSA and anti-SSB  antibodies are not increased.  She has not had any flares in  this pregnancy.  Patient reports she reduced buprenorphine  dosage to 4 mg   tid and feels well.  Ultrasound  Normal fetal growth and amniotic fluid.  Cephalic  presentation.  Fetal heart rate and rhythm appear normal.  I reassured the patient of the findings.  We discussed  hydroxychloroquine (Plaquenil) treatment and the patient is  planning to take hydroxychloroquine.  I discussed the importance of antenatal testing beginning at  [redacted] weeks gestation. ---------------------------------------------------------------------- Recommendations  - Weekly antenatal testing from [redacted] weeks gestation until  delivery. ----------------------------------------------------------------------                  Fredia Fresh, MD Electronically Signed Final Report   02/10/2024 01:46 pm ----------------------------------------------------------------------    Recent Labs: Lab Results  Component Value Date  WBC 5.9 02/16/2024   HGB 12.1 02/16/2024   PLT 212 02/16/2024   NA 134 (L) 01/03/2024   K 3.8 01/03/2024   CL 103 01/03/2024   CO2 22 01/03/2024   GLUCOSE 89 01/03/2024   BUN <5 (L) 01/03/2024   CREATININE 0.52 01/03/2024   BILITOT 0.3 01/03/2024   ALKPHOS 36 (L) 01/03/2024   AST 19 01/03/2024   ALT 12 01/03/2024   PROT 6.4 (L) 01/03/2024   ALBUMIN 3.2 (L) 01/03/2024   CALCIUM 8.7 (L) 01/03/2024   GFRAA >60 02/15/2019   No results found for: ANA, RF  Speciality Comments: No specialty comments available.  Procedures:  No procedures performed Allergies: Morphine, Morphine and codeine, Nitrofurantoin, Fentanyl , Hydrocodone , Meloxicam , Naproxen , and Chlorhexidine    Assessment / Plan:     Visit Diagnoses: No diagnosis found.  #High risk medication use  Orders: No orders of the defined types were placed in this encounter.  No orders of the defined types were placed in this encounter.   I personally spent a total of *** minutes in the care of the patient today including {Time Based Coding:210964241}.  Follow-Up Instructions: No follow-ups on file.   Asberry Claw,  DO

## 2024-02-21 NOTE — MAU Note (Signed)
Pt in b-room

## 2024-02-22 ENCOUNTER — Encounter (HOSPITAL_COMMUNITY)

## 2024-02-24 ENCOUNTER — Encounter (HOSPITAL_COMMUNITY)

## 2024-02-27 ENCOUNTER — Other Ambulatory Visit (HOSPITAL_COMMUNITY): Payer: Self-pay

## 2024-03-02 ENCOUNTER — Encounter (HOSPITAL_COMMUNITY)

## 2024-03-02 ENCOUNTER — Inpatient Hospital Stay (HOSPITAL_COMMUNITY): Admission: RE | Admit: 2024-03-02 | Source: Ambulatory Visit

## 2024-03-03 ENCOUNTER — Other Ambulatory Visit (HOSPITAL_COMMUNITY): Payer: Self-pay

## 2024-03-06 ENCOUNTER — Ambulatory Visit (HOSPITAL_COMMUNITY)
Admission: RE | Admit: 2024-03-06 | Discharge: 2024-03-06 | Disposition: A | Discharge status: Home / Self Care | Source: Ambulatory Visit | Attending: Obstetrics and Gynecology | Admitting: Obstetrics and Gynecology

## 2024-03-06 ENCOUNTER — Other Ambulatory Visit: Payer: Self-pay

## 2024-03-06 ENCOUNTER — Other Ambulatory Visit (HOSPITAL_COMMUNITY): Payer: Self-pay

## 2024-03-06 ENCOUNTER — Ambulatory Visit (INDEPENDENT_AMBULATORY_CARE_PROVIDER_SITE_OTHER): Admitting: Advanced Practice Midwife

## 2024-03-06 VITALS — BP 107/74 | HR 88 | Wt 195.0 lb

## 2024-03-06 VITALS — BP 100/65 | HR 87 | Temp 97.6°F | Resp 16

## 2024-03-06 DIAGNOSIS — Z3A30 30 weeks gestation of pregnancy: Secondary | ICD-10-CM

## 2024-03-06 DIAGNOSIS — O0990 Supervision of high risk pregnancy, unspecified, unspecified trimester: Secondary | ICD-10-CM

## 2024-03-06 DIAGNOSIS — F419 Anxiety disorder, unspecified: Secondary | ICD-10-CM

## 2024-03-06 DIAGNOSIS — F1120 Opioid dependence, uncomplicated: Secondary | ICD-10-CM

## 2024-03-06 DIAGNOSIS — D508 Other iron deficiency anemias: Secondary | ICD-10-CM | POA: Insufficient documentation

## 2024-03-06 DIAGNOSIS — Z98891 History of uterine scar from previous surgery: Secondary | ICD-10-CM

## 2024-03-06 DIAGNOSIS — F1191 Opioid use, unspecified, in remission: Secondary | ICD-10-CM

## 2024-03-06 MED ORDER — IRON SUCROSE 200 MG IVPB - SIMPLE MED
200.0000 mg | Freq: Once | Status: AC
  Administered 2024-03-06: 200 mg via INTRAVENOUS
  Filled 2024-03-06: qty 200

## 2024-03-06 MED ORDER — SODIUM CHLORIDE 0.9 % IV BOLUS
250.0000 mL | Freq: Once | INTRAVENOUS | Status: AC
  Administered 2024-03-06: 250 mL via INTRAVENOUS

## 2024-03-06 NOTE — Progress Notes (Signed)
 Subjective:  Felicia Davila is a 33 y.o. [id number] at [redacted]w[redacted]d being seen today for ongoing prenatal care.  She is currently monitored for the following issues for this high-risk pregnancy and has Prior methotrexate  therapy; Glomus jugulare tumor (HCC); Murmur; Achilles tendon rupture, right, initial encounter; History of cesarean delivery; Supervision of high risk pregnancy, antepartum; Systemic lupus complicating pregnancy (HCC); Opioid use disorder in remission; History of cholestasis during pregnancy; Radiation exposure affecting pregnancy; Group B streptococcal bacteriuria; Hepatitis C antibody detected; Iron  deficiency anemia; Tachycardia; and Anxiety on their problem list.  Pt is here for consult visit for Subutex  maintenance in pregnancy. Patient reports creepy crawly sensation more recently that she thinks may be withdrawal Sx vs Lupus.  Contractions: Not present. Vag. Bleeding: None.  Movement: Present. Denies leaking of fluid.   Referred to [facility] clinic for consult only for discussion about R/B/I medications for OUD in pregnancy. Currently taking Subutex  4 mg BID. Tapered to this lower dose 8 weeks ago. Mild Sx of withdrawal or cravings on this dose. Taking Xanax  3 or 4 times per day for about 15 years. Subutex  Rx'd by Dr. Aldo. Xanax  Rx'd by Psychiatrist Dr. Claudene.   The following portions of the patient's history were reviewed and updated as appropriate: allergies, current medications, past family history, past medical history, past social history, past surgical history and problem list. Problem list updated.  Objective:   Vitals:   03/06/24 1520  BP: 107/74  Pulse: 88  Weight: 195 lb (88.5 kg)    Fetal Status: Fetal Heart Rate (bpm): 130   Movement: Present     General:  Alert, oriented and cooperative. Patient is in no acute distress.  Skin: Skin is warm and dry. No rash noted.   Cardiovascular: Normal heart rate noted  Respiratory: Normal respiratory effort, no problems  with respiration noted  Abdomen: Soft, gravid, appropriate for gestational age. Pain/Pressure: Absent     Pelvic: Vag. Bleeding: None     Cervical exam deferred        Extremities: Normal range of motion.  Edema: None  Mental Status: Normal mood and affect. Normal behavior. Normal judgment and thought content.   Urinalysis:      PDMP reviewed during this encounter.   Last UDS: No results found for: CREATIUR   Assessment and Plan:  Pregnancy: H1E8847 at [redacted]w[redacted]d  1. Supervision of high risk pregnancy, antepartum (Primary)  2. Opioid use disorder in remission  3. History of cesarean delivery  4. Anxiety  5. [redacted] weeks gestation of pregnancy  6. Pregnancy complicated by subutex  maintenance, antepartum (HCC)   Preterm labor symptoms and general obstetric precautions including but not limited to vaginal bleeding, contractions, leaking of fluid and fetal movement were reviewed in detail with the patient. Please refer to After Visit Summary for other counseling recommendations.   No follow-ups on file.   Future Appointments  Date Time Provider Department Center  03/09/2024 10:00 AM [facility] None  03/13/2024  3:35 PM [name], MD [facility]  03/14/2024 11:00 AM [facility] None  03/19/2024  1:15 PM [facility] PROVIDER 1 [facility]  03/19/2024  1:30 PM [facility] US2 [facility]  03/26/2024  1:15 PM [facility] PROVIDER 1 [facility]  03/26/2024  1:30 PM [facility] US4 [facility]  03/29/2024  9:30 AM [name], MD [facility]  04/02/2024  1:15 PM [facility] PROVIDER 1 [facility]  04/02/2024  1:30 PM [facility] US2 [facility]  04/09/2024  2:15 PM [facility] PROVIDER 1 [facility]  [facility]  04/09/2024  2:30 PM [facility] US3 [facility]  04/12/2024  9:30 AM [name], MD [facility]  04/16/2024  1:15 PM [facility] PROVIDER 1 [facility]  04/16/2024  1:30 PM [facility] US1 [facility]    Total face-to-face time with patient: 30 minutes.  Over 50%  of encounter was spent on counseling and coordination of care.   Cynthia Stainback  Claudene HOWARD 03/08/2024 11:21 AM [facility] [place]

## 2024-03-07 NOTE — Progress Notes (Signed)
 PRENATAL VISIT NOTE  Subjective:  Felicia Davila is a 33 y.o. H1E8847 at [redacted]w[redacted]d being seen today for ongoing prenatal care.  She is currently monitored for the following issues for this high-risk pregnancy and has Prior methotrexate  therapy; Glomus jugulare tumor (HCC); Murmur; Achilles tendon rupture, right, initial encounter; History of cesarean delivery; Supervision of high risk pregnancy, antepartum; Systemic lupus complicating pregnancy (HCC); Opioid use disorder in remission; History of cholestasis during pregnancy; Radiation exposure affecting pregnancy; Group B streptococcal bacteriuria; Hepatitis C antibody detected; Iron  deficiency anemia; Tachycardia; and Anxiety on their problem list.  Patient reports no complaints.  Contractions: Not present. Vag. Bleeding: None.  Movement: Present. Denies leaking of fluid.   The following portions of the patient's history were reviewed and updated as appropriate: allergies, current medications, past family history, past medical history, past social history, past surgical history and problem list.   Objective:   Vitals:   03/08/24 1032  BP: 105/70  Pulse: 99  Weight: 195 lb (88.5 kg)    Fetal Status:  Fetal Heart Rate (bpm): 125   Movement: Present    General: Alert, oriented and cooperative. Patient is in no acute distress.  Skin: Skin is warm and dry. No rash noted.   Cardiovascular: Normal heart rate noted  Respiratory: Normal respiratory effort, no problems with respiration noted  Abdomen: Soft, gravid, appropriate for gestational age.  Pain/Pressure: Present     Pelvic: Cervical exam deferred        Extremities: Normal range of motion.  Edema: None  Mental Status: Normal mood and affect. Normal behavior. Normal judgment and thought content.   Assessment and Plan:  Pregnancy: H1E8847 at [redacted]w[redacted]d 1. Supervision of high risk pregnancy, antepartum 28w labs normal.  Offer flu and tdap - Declines.  Signed tubal papers in the event she  desires tubal at time of c-section.   2. Pregnancy with 31 completed weeks gestation  3. Opioid use disorder in remission (Primary) S/p first reach appt. She liked her visit and plans to follow up with them.   4. Systemic lupus complicating pregnancy (HCC) Stable thus far. SSA/B wnl.   5. Iron  deficiency anemia secondary to inadequate dietary iron  intake HgB 12.1. Does not need to continue IV Iron .   6. History of cesarean delivery Schedule for 39 weeks. Referral sent to [facility].   7. Hepatitis C antibody detected Check HCV rna level  8. Group B streptococcal bacteriuria PCN if labors.   9. Anxiety Ativan  - managed by psychiatrist.   Preterm labor symptoms and general obstetric precautions including but not limited to vaginal bleeding, contractions, leaking of fluid and fetal movement were reviewed in detail with the patient. Please refer to After Visit Summary for other counseling recommendations.   Return in about 2 weeks (around 03/22/2024).  Future Appointments  Date Time Provider Department Center  03/09/2024 10:00 AM [facility] None  03/13/2024  3:35 PM [name], MD [facility]  03/14/2024 11:00 AM [facility] None  03/19/2024  1:15 PM [facility] PROVIDER 1 [facility]  03/19/2024  1:30 PM [facility] US2 [facility]  03/26/2024  1:15 PM [facility] PROVIDER 1 [facility]  03/26/2024  1:30 PM [facility] US4 [facility]  03/29/2024  9:30 AM [name], MD [facility]  04/02/2024  1:15 PM [facility] PROVIDER 1 [facility]  04/02/2024  1:30 PM [facility] US2 [facility]  04/09/2024  2:15 PM [facility] PROVIDER 1 [facility]  04/09/2024  2:30 PM [facility] US3 [facility]  04/12/2024  9:30 AM [name], MD [facility]  04/16/2024  1:15 PM [facility] PROVIDER 1 [facility]  04/16/2024  1:30 PM [facility] US1 [facility]    Vina Solian, MD

## 2024-03-08 ENCOUNTER — Ambulatory Visit (INDEPENDENT_AMBULATORY_CARE_PROVIDER_SITE_OTHER): Admitting: Obstetrics and Gynecology

## 2024-03-08 VITALS — BP 105/70 | HR 99 | Wt 195.0 lb

## 2024-03-08 DIAGNOSIS — O0993 Supervision of high risk pregnancy, unspecified, third trimester: Secondary | ICD-10-CM | POA: Diagnosis not present

## 2024-03-08 DIAGNOSIS — Z3A31 31 weeks gestation of pregnancy: Secondary | ICD-10-CM

## 2024-03-08 DIAGNOSIS — O99891 Other specified diseases and conditions complicating pregnancy: Secondary | ICD-10-CM | POA: Diagnosis not present

## 2024-03-08 DIAGNOSIS — O0990 Supervision of high risk pregnancy, unspecified, unspecified trimester: Secondary | ICD-10-CM

## 2024-03-08 DIAGNOSIS — D508 Other iron deficiency anemias: Secondary | ICD-10-CM

## 2024-03-08 DIAGNOSIS — M549 Dorsalgia, unspecified: Secondary | ICD-10-CM

## 2024-03-08 DIAGNOSIS — O99323 Drug use complicating pregnancy, third trimester: Secondary | ICD-10-CM | POA: Diagnosis not present

## 2024-03-08 DIAGNOSIS — R8271 Bacteriuria: Secondary | ICD-10-CM

## 2024-03-08 DIAGNOSIS — F419 Anxiety disorder, unspecified: Secondary | ICD-10-CM

## 2024-03-08 DIAGNOSIS — F1191 Opioid use, unspecified, in remission: Secondary | ICD-10-CM

## 2024-03-08 DIAGNOSIS — R7689 Other specified abnormal immunological findings in serum: Secondary | ICD-10-CM

## 2024-03-08 DIAGNOSIS — M329 Systemic lupus erythematosus, unspecified: Secondary | ICD-10-CM

## 2024-03-08 DIAGNOSIS — Z98891 History of uterine scar from previous surgery: Secondary | ICD-10-CM

## 2024-03-08 MED ORDER — MISC. DEVICES MISC: 0 refills | Status: DC

## 2024-03-08 NOTE — Patient Instructions (Signed)
 Fit Splint

## 2024-03-09 ENCOUNTER — Encounter (HOSPITAL_COMMUNITY): Payer: Self-pay | Admitting: Obstetrics and Gynecology

## 2024-03-09 ENCOUNTER — Encounter (HOSPITAL_COMMUNITY)

## 2024-03-09 ENCOUNTER — Other Ambulatory Visit

## 2024-03-09 ENCOUNTER — Ambulatory Visit

## 2024-03-09 ENCOUNTER — Other Ambulatory Visit (HOSPITAL_COMMUNITY): Payer: Self-pay

## 2024-03-09 MED ORDER — ALPRAZOLAM 1 MG PO TABS
0.5000 mg | ORAL_TABLET | Freq: Three times a day (TID) | ORAL | 1 refills | Status: DC | PRN
  Filled 2024-03-31: qty 90, 30d supply, fill #0

## 2024-03-09 MED ORDER — FLUOXETINE HCL 20 MG PO CAPS
20.0000 mg | ORAL_CAPSULE | Freq: Every day | ORAL | 0 refills | Status: AC
  Filled 2024-03-09: qty 30, 30d supply, fill #0
  Filled 2024-03-31: qty 90, 90d supply, fill #0
  Filled 2024-03-31: qty 30, 30d supply, fill #0
  Filled 2024-04-26: qty 30, 30d supply, fill #1

## 2024-03-10 LAB — CBC
Hematocrit: 34.8 % (ref 34.0–46.6)
Hemoglobin: 11.2 g/dL (ref 11.1–15.9)
MCH: 29.6 pg (ref 26.6–33.0)
MCHC: 32.2 g/dL (ref 31.5–35.7)
MCV: 92 fL (ref 79–97)
Platelets: 225 x10E3/uL (ref 150–450)
RBC: 3.79 x10E6/uL (ref 3.77–5.28)
RDW: 12 % (ref 11.7–15.4)
WBC: 6.4 x10E3/uL (ref 3.4–10.8)

## 2024-03-10 LAB — HCV RNA QUANT: Hepatitis C Quantitation: NOT DETECTED [IU]/mL

## 2024-03-10 LAB — FERRITIN: Ferritin: 224 ng/mL — ABNORMAL HIGH (ref 15–150)

## 2024-03-11 ENCOUNTER — Encounter: Payer: Self-pay | Admitting: Obstetrics and Gynecology

## 2024-03-12 ENCOUNTER — Ambulatory Visit: Payer: Self-pay | Admitting: Obstetrics and Gynecology

## 2024-03-12 ENCOUNTER — Other Ambulatory Visit (HOSPITAL_COMMUNITY): Payer: Self-pay

## 2024-03-12 DIAGNOSIS — M329 Systemic lupus erythematosus, unspecified: Secondary | ICD-10-CM

## 2024-03-13 ENCOUNTER — Encounter: Payer: Self-pay | Admitting: Family Medicine

## 2024-03-13 ENCOUNTER — Ambulatory Visit: Admitting: Family Medicine

## 2024-03-13 ENCOUNTER — Other Ambulatory Visit (HOSPITAL_COMMUNITY): Payer: Self-pay

## 2024-03-13 ENCOUNTER — Other Ambulatory Visit: Payer: Self-pay

## 2024-03-13 VITALS — BP 121/86 | HR 111 | Wt 196.6 lb

## 2024-03-13 DIAGNOSIS — M329 Systemic lupus erythematosus, unspecified: Secondary | ICD-10-CM

## 2024-03-13 DIAGNOSIS — R7689 Other specified abnormal immunological findings in serum: Secondary | ICD-10-CM

## 2024-03-13 DIAGNOSIS — Z98891 History of uterine scar from previous surgery: Secondary | ICD-10-CM

## 2024-03-13 DIAGNOSIS — O0990 Supervision of high risk pregnancy, unspecified, unspecified trimester: Secondary | ICD-10-CM

## 2024-03-13 DIAGNOSIS — F419 Anxiety disorder, unspecified: Secondary | ICD-10-CM

## 2024-03-13 DIAGNOSIS — F1191 Opioid use, unspecified, in remission: Secondary | ICD-10-CM

## 2024-03-13 DIAGNOSIS — R8271 Bacteriuria: Secondary | ICD-10-CM | POA: Insufficient documentation

## 2024-03-13 NOTE — Progress Notes (Unsigned)
 Subjective:  Felicia Davila is a 33 y.o. [phone] at [redacted]w[redacted]d being seen today for ongoing prenatal care.  She is currently monitored for the following issues for this high-risk pregnancy and has Prior methotrexate  therapy; Glomus jugulare tumor (HCC); Murmur; Achilles tendon rupture, right, initial encounter; History of cesarean delivery; Supervision of high risk pregnancy, antepartum; Systemic lupus complicating pregnancy (HCC); Opioid use disorder in remission; History of cholestasis during pregnancy; Radiation exposure affecting pregnancy; Group B streptococcal bacteriuria; Hepatitis C antibody detected; Iron  deficiency anemia; Tachycardia; and Anxiety on their problem list.  Patient reports she would like to discuss multiple issues.  Contractions: Not present. Vag. Bleeding: None.  Movement: Present. Denies leaking of fluid.   The following portions of the patient's history were reviewed and updated as appropriate: allergies, current medications, past family history, past medical history, past social history, past surgical history and problem list. Problem list updated.  Objective:   Vitals:   03/13/24 1608  BP: 121/86  Pulse: (!) 111  Weight: 196 lb 9.6 oz (89.2 kg)    Fetal Status: Fetal Heart Rate (bpm): 130   Movement: Present     General:  Alert, oriented and cooperative. Patient is in no acute distress.  Skin: Skin is warm and dry. No rash noted.   Cardiovascular: Normal heart rate noted  Respiratory: Normal respiratory effort, no problems with respiration noted  Abdomen: Soft, gravid, appropriate for gestational age. Pain/Pressure: Absent     Pelvic: Vag. Bleeding: None     Cervical exam deferred        Extremities: Normal range of motion.  Edema: None  Mental Status: Normal mood and affect. Normal behavior. Normal judgment and thought content.   Urinalysis:      PDMP not reviewed this encounter.    Last UDS: No results found for: CREATIUR  None on  file  Assessment and Plan:  Pregnancy: [id number] at [redacted]w[redacted]d  1. Supervision of high risk pregnancy, antepartum (Primary) BP and FHR normal Up to date on labs  2. Opioid use disorder in remission Currently gets rx through [facility] Currently doing total 16 mg daily (8/4/4), this was increased at last visit and she feels it has helped a lot  3. Hepatitis C antibody detected Neg viral load  4. Systemic lupus complicating pregnancy (HCC) Not addressed at today's visit  5. History of cesarean delivery Asked about mode of delivery, considering vaginal Reports she didn't dilate past 3-4 cm with first delivery then went for section Urgent repeat after PROM with second Discussed given severe anxiety, rest of clinical scenario, recommend RCS She is OK with this plan Not sure about BTL, might want more kids down the line, recommended against  6. Group B streptococcal bacteriuria Ppx if labors  7. Anxiety Gets rx through [facility], on Xanax  1 mg TID for many years, more recently has also received some extras to use for panic attacks Reports her prescribers are moving to closer to [place] and she lives in [place], so she is worried about losing her prescription  Would like to get transferred to [facility] Idell Remington at [facility], needs referral which we are happy to provide*** She has rx going until the end of January, told her we would not leave her hanging  8. Asymptomatic bacteriuria during pregnancy TOC today  Preterm labor symptoms and general obstetric precautions including but not limited to vaginal bleeding, contractions, leaking of fluid and fetal movement were reviewed in detail with the patient. Please refer to After  Visit Summary for other counseling recommendations.  No follow-ups on file.  Future Appointments  Date Time Provider Department Center  03/19/2024  1:15 PM [facility] PROVIDER 1 [facility]  03/19/2024  1:30 PM [facility] US2 [facility]  03/26/2024   1:15 PM [facility] PROVIDER 1 [facility]  03/26/2024  1:30 PM [facility] US4 [facility]  03/29/2024  9:30 AM [name], MD [facility]  04/02/2024  1:15 PM [facility] PROVIDER 1 [facility]  04/02/2024  1:30 PM [facility] US2 [facility]  04/09/2024  2:15 PM [facility] PROVIDER 1 [facility]  04/09/2024  2:30 PM [facility] US3 [facility]  04/12/2024  9:30 AM [name], MD [facility]  04/16/2024  1:15 PM [facility] PROVIDER 1 [facility]  04/16/2024  1:30 PM [facility] US1 [facility]     [name], MD

## 2024-03-13 NOTE — Patient Instructions (Signed)
 [employer] partners with [employer] to process electric breast pump  requests. Members can order covered breast pumps directly from [employer] without  prior authorization for consumer grade pumps.   [employer] [email or web address]://[email or web address]/ ([phone])[phone] Monday - Friday, 8:00 AM to 9:00 PM Eastern  Additional Support: [employer] offers an Chief Executive Officer for new moms" option that includes  a Breastfeeding Support Kit, which includes an infant support nursing pillow, washable  nursing pads, a nursing cover and educational brochures. Expecting parents should call  member services to request these benefits. There is also a [employer], which  includes infant sleep guidelines and educational brochures, a Halo sleepsack and a  Soothie pacifier.   www.choosehealthyblue.com Member services: [phone]  Monday - Saturday, 7 AM to 6 PM Eastern

## 2024-03-13 NOTE — Progress Notes (Unsigned)
   Subjective:   Felicia Davila is a 33 y.o. [phone] here today for ongoing substance exposed newborn consult.   Health Maintenance Due  Topic Date Due   DTaP/Tdap/Td (6 - Td or Tdap) 04/09/2018   HPV VACCINES (1 - 3-dose SCDM series) Never done   Influenza Vaccine  Never done   COVID-19 Vaccine (1 - 2025-26 season) Never done    Past Medical History:  Diagnosis Date   Anxiety    Depression    Fibroid    Lupus    PONV (postoperative nausea and vomiting)    UTI (urinary tract infection)     Past Surgical History:  Procedure Laterality Date   CESAREAN SECTION     CHOLECYSTECTOMY     DILATION AND EVACUATION N/A 11/06/2021   Procedure: DILATATION AND EVACUATION;  Surgeon: Alger Gong, MD;  Location: [facility] OR;  Service: Gynecology;  Laterality: N/A;   FOREARM SURGERY Left    HYSTEROSCOPY WITH D & C  12/14/2021   Procedure: DILATATION AND CURETTAGE /HYSTEROSCOPY;  Surgeon: Zina Jerilynn LABOR, MD;  Location: [facility] OR;  Service: Gynecology;;    The following portions of the patient's history were reviewed and updated as appropriate: allergies, current medications, past family history, past medical history, past social history, past surgical history and problem list.     Objective:   Felicia Davila is well appearing in no acute distress. She has linear thinking and clear communication.      Assessment and Plan:  Met with Felicia Davila today at [facility] for ongoing substance exposed newborn consult. We discussed her ongoing prenatal care and expected plans following delivery. Reintroduced REACH Team and our goals for wraparound care including community services via B. Gottschalk, LCAS. Felicia Davila Push NAS consult phone number and encouraged Felicia Davila to contact NAS consult phone in between appointments with any further questions or concerns.     Problem List Items Addressed This Visit       Other   History of cesarean delivery   Supervision of high risk pregnancy, antepartum -  Primary   Systemic lupus complicating pregnancy (HCC)   Opioid use disorder in remission   Relevant Orders   ToxAssure Flex 15, Ur   Group B streptococcal bacteriuria   Hepatitis C antibody detected   Anxiety   Relevant Orders   ToxAssure Flex 15, Ur   Asymptomatic bacteriuria    Routine preventative health maintenance measures emphasized. Please refer to After Visit Summary for other counseling recommendations.   No follow-ups on file.    Total face-to-face time with patient: 20 minutes.  Over 50% of encounter was spent on counseling and coordination of care.   Comer Fang, NNP-BC Neonatal Nurse Practitioner Substance Exposed Newborn Consult at the [facility] [phone]

## 2024-03-14 ENCOUNTER — Other Ambulatory Visit: Payer: Self-pay

## 2024-03-14 ENCOUNTER — Encounter (HOSPITAL_COMMUNITY)

## 2024-03-14 ENCOUNTER — Telehealth: Payer: Self-pay

## 2024-03-14 ENCOUNTER — Encounter: Payer: Self-pay | Admitting: Family Medicine

## 2024-03-14 ENCOUNTER — Other Ambulatory Visit (HOSPITAL_COMMUNITY): Payer: Self-pay

## 2024-03-14 DIAGNOSIS — F419 Anxiety disorder, unspecified: Secondary | ICD-10-CM

## 2024-03-14 NOTE — Telephone Encounter (Signed)
 Psychiatry referral order, provider note, demographics, and insurance sent to [employer] for [employer].

## 2024-03-15 ENCOUNTER — Other Ambulatory Visit (HOSPITAL_COMMUNITY): Payer: Self-pay

## 2024-03-16 ENCOUNTER — Other Ambulatory Visit (HOSPITAL_COMMUNITY): Payer: Self-pay

## 2024-03-16 ENCOUNTER — Ambulatory Visit: Payer: Self-pay | Admitting: Family Medicine

## 2024-03-16 ENCOUNTER — Other Ambulatory Visit: Payer: Self-pay

## 2024-03-16 LAB — TOXASSURE FLEX 15, UR
6-ACETYLMORPHINE IA: NEGATIVE ng/mL
7-aminoclonazepam: NOT DETECTED ng/mg{creat}
AMPHETAMINES IA: NEGATIVE ng/mL
Alpha-hydroxyalprazolam: 1168 ng/mg{creat}
Alpha-hydroxymidazolam: NOT DETECTED ng/mg{creat}
Alpha-hydroxytriazolam: NOT DETECTED ng/mg{creat}
Alprazolam: 1064 ng/mg{creat}
BARBITURATES IA: NEGATIVE ng/mL
BUPRENORPHINE: POSITIVE
Benzodiazepines: POSITIVE
Buprenorphine: 107 ng/mg{creat}
CANNABINOIDS IA: NEGATIVE ng/mL
COCAINE METABOLITE IA: NEGATIVE ng/mL
Clonazepam: NOT DETECTED ng/mg{creat}
Creatinine: 28 mg/dL (ref >=20)
Desalkylflurazepam: NOT DETECTED ng/mg{creat}
Desmethyldiazepam: NOT DETECTED ng/mg{creat}
Desmethylflunitrazepam: NOT DETECTED ng/mg{creat}
Diazepam: NOT DETECTED ng/mg{creat}
ETHYL ALCOHOL Enzymatic: NEGATIVE g/dL
FENTANYL: NEGATIVE
Fentanyl: NOT DETECTED ng/mg{creat}
Flunitrazepam: NOT DETECTED ng/mg{creat}
Lorazepam: NOT DETECTED ng/mg{creat}
METHADONE IA: NEGATIVE ng/mL
METHADONE MTB IA: NEGATIVE ng/mL
Midazolam: NOT DETECTED ng/mg{creat}
Norbuprenorphine: 743 ng/mg{creat}
Norfentanyl: NOT DETECTED ng/mg{creat}
OPIATE CLASS IA: NEGATIVE ng/mL
OXYCODONE CLASS IA: NEGATIVE ng/mL
Oxazepam: NOT DETECTED ng/mg{creat}
PHENCYCLIDINE IA: NEGATIVE ng/mL
TAPENTADOL, IA: NEGATIVE ng/mL
TRAMADOL IA: NEGATIVE ng/mL
Temazepam: NOT DETECTED ng/mg{creat}

## 2024-03-18 ENCOUNTER — Encounter: Payer: Self-pay | Admitting: Obstetrics and Gynecology

## 2024-03-19 ENCOUNTER — Ambulatory Visit: Attending: Obstetrics and Gynecology | Admitting: Obstetrics

## 2024-03-19 ENCOUNTER — Encounter: Payer: Self-pay | Admitting: Family Medicine

## 2024-03-19 ENCOUNTER — Other Ambulatory Visit (HOSPITAL_COMMUNITY): Payer: Self-pay

## 2024-03-19 ENCOUNTER — Ambulatory Visit: Payer: Self-pay

## 2024-03-19 VITALS — BP 104/66

## 2024-03-19 DIAGNOSIS — O26893 Other specified pregnancy related conditions, third trimester: Secondary | ICD-10-CM | POA: Diagnosis present

## 2024-03-19 DIAGNOSIS — O99342 Other mental disorders complicating pregnancy, second trimester: Secondary | ICD-10-CM

## 2024-03-19 DIAGNOSIS — F419 Anxiety disorder, unspecified: Secondary | ICD-10-CM | POA: Insufficient documentation

## 2024-03-19 DIAGNOSIS — O039 Complete or unspecified spontaneous abortion without complication: Secondary | ICD-10-CM | POA: Insufficient documentation

## 2024-03-19 DIAGNOSIS — M329 Systemic lupus erythematosus, unspecified: Secondary | ICD-10-CM

## 2024-03-19 DIAGNOSIS — Z3A32 32 weeks gestation of pregnancy: Secondary | ICD-10-CM | POA: Insufficient documentation

## 2024-03-19 DIAGNOSIS — O99891 Other specified diseases and conditions complicating pregnancy: Secondary | ICD-10-CM | POA: Diagnosis not present

## 2024-03-19 DIAGNOSIS — Z362 Encounter for other antenatal screening follow-up: Secondary | ICD-10-CM

## 2024-03-19 DIAGNOSIS — O09299 Supervision of pregnancy with other poor reproductive or obstetric history, unspecified trimester: Secondary | ICD-10-CM

## 2024-03-19 DIAGNOSIS — O99323 Drug use complicating pregnancy, third trimester: Secondary | ICD-10-CM

## 2024-03-19 DIAGNOSIS — F1120 Opioid dependence, uncomplicated: Secondary | ICD-10-CM

## 2024-03-19 DIAGNOSIS — O34219 Maternal care for unspecified type scar from previous cesarean delivery: Secondary | ICD-10-CM | POA: Diagnosis not present

## 2024-03-19 DIAGNOSIS — Z3689 Encounter for other specified antenatal screening: Secondary | ICD-10-CM | POA: Insufficient documentation

## 2024-03-19 DIAGNOSIS — O09293 Supervision of pregnancy with other poor reproductive or obstetric history, third trimester: Secondary | ICD-10-CM | POA: Insufficient documentation

## 2024-03-19 DIAGNOSIS — O99340 Other mental disorders complicating pregnancy, unspecified trimester: Secondary | ICD-10-CM | POA: Insufficient documentation

## 2024-03-19 DIAGNOSIS — Z98891 History of uterine scar from previous surgery: Secondary | ICD-10-CM

## 2024-03-19 NOTE — Progress Notes (Signed)
 MFM Consult Note  Felicia Davila is currently at [redacted]w[redacted]d. She has been followed due to maternal lupus and maternal treatment with Subutex  replacement therapy.  She was treated with Plaquenil  for her lupus.  However, the patient reported that she discontinued Plaquenil  as she did not like the way it made her feel.    She may have developed a lupus flare with a rash on her face last week after she discontinued Plaquenil .  Sonographic findings Single intrauterine pregnancy at 32w 5d. Fetal cardiac activity: Observed. Presentation: Cephalic. Fetal biometry shows the estimated fetal weight of 4 lb 5 oz,  1950g (28%). Amniotic fluid: Within normal limits. AFI: 16.65 cm.  MVP: 5.09 cm. Placenta: Anterior. BPP: 8/8.   Maternal lupus  The patient was advised that should she develop another lupus flare later in her pregnancy, that prednisone  may be taken during pregnancy without any fetal concerns.  Due to maternal lupus, we will continue to follow her with weekly fetal testing until delivery.    The increased risk of preeclampsia in women with lupus was discussed.  Her blood pressure today was 104/66.    Preeclampsia precautions were reviewed.    She will return in 1 week for another BPP.  The patient stated that all of her questions were answered.   A total of 20 minutes was spent counseling and coordinating the care for this patient.  Greater than 50% of the time was spent in direct face-to-face contact.

## 2024-03-20 ENCOUNTER — Other Ambulatory Visit: Payer: Self-pay

## 2024-03-20 DIAGNOSIS — Z3A32 32 weeks gestation of pregnancy: Secondary | ICD-10-CM

## 2024-03-20 MED ORDER — MISC. DEVICES MISC: 0 refills | Status: DC

## 2024-03-20 NOTE — Addendum Note (Signed)
 Addended by: HONORE VERNELL BRAVO on: 03/20/2024 08:04 AM   Modules accepted: Orders

## 2024-03-20 NOTE — Telephone Encounter (Signed)
 Duplicate message. See MyChart message encounter 03/14/24.

## 2024-03-20 NOTE — Telephone Encounter (Signed)
 Referral not accepted by [employer] (not accepting patients). New referral sent to Dr. Dallas Maurice Raddle. At [facility].

## 2024-03-26 ENCOUNTER — Encounter: Payer: Self-pay | Admitting: Family Medicine

## 2024-03-26 ENCOUNTER — Ambulatory Visit: Attending: Obstetrics and Gynecology

## 2024-03-26 ENCOUNTER — Encounter: Payer: Self-pay | Admitting: Obstetrics and Gynecology

## 2024-03-26 ENCOUNTER — Ambulatory Visit: Admitting: Obstetrics

## 2024-03-26 ENCOUNTER — Telehealth: Payer: Self-pay | Admitting: *Deleted

## 2024-03-26 VITALS — BP 125/63 | HR 128

## 2024-03-26 DIAGNOSIS — F1120 Opioid dependence, uncomplicated: Secondary | ICD-10-CM

## 2024-03-26 DIAGNOSIS — Z3A33 33 weeks gestation of pregnancy: Secondary | ICD-10-CM

## 2024-03-26 DIAGNOSIS — O99323 Drug use complicating pregnancy, third trimester: Secondary | ICD-10-CM

## 2024-03-26 DIAGNOSIS — O99320 Drug use complicating pregnancy, unspecified trimester: Secondary | ICD-10-CM | POA: Insufficient documentation

## 2024-03-26 DIAGNOSIS — O09299 Supervision of pregnancy with other poor reproductive or obstetric history, unspecified trimester: Secondary | ICD-10-CM | POA: Insufficient documentation

## 2024-03-26 DIAGNOSIS — Z98891 History of uterine scar from previous surgery: Secondary | ICD-10-CM | POA: Insufficient documentation

## 2024-03-26 DIAGNOSIS — M329 Systemic lupus erythematosus, unspecified: Secondary | ICD-10-CM

## 2024-03-26 DIAGNOSIS — O34219 Maternal care for unspecified type scar from previous cesarean delivery: Secondary | ICD-10-CM | POA: Diagnosis not present

## 2024-03-26 DIAGNOSIS — O99891 Other specified diseases and conditions complicating pregnancy: Secondary | ICD-10-CM | POA: Diagnosis not present

## 2024-03-26 DIAGNOSIS — O99343 Other mental disorders complicating pregnancy, third trimester: Secondary | ICD-10-CM

## 2024-03-26 DIAGNOSIS — O2623 Pregnancy care for patient with recurrent pregnancy loss, third trimester: Secondary | ICD-10-CM | POA: Diagnosis not present

## 2024-03-26 DIAGNOSIS — F419 Anxiety disorder, unspecified: Secondary | ICD-10-CM

## 2024-03-26 NOTE — Progress Notes (Signed)
 MFM Consult Note  Felicia Davila is currently at [redacted]w[redacted]d. She was seen for a BPP due to maternal lupus and maternal treatment with Subutex .    She denies any problems since her last exam.  Her blood pressure today was 125/63.SABRA  Sonographic findings Single intrauterine pregnancy at 33w 5d. Fetal cardiac activity: Observed. Presentation: Cephalic. Amniotic fluid: Within normal limits. AFI: 15.51 cm,  MVP: 4.74 cm. Placenta: Anterior. BPP: 8/8.   The patient is currently scheduled for a repeat cesarean delivery on May 02, 2024 (at 39 weeks).  She is concerned that her last delivery occurred quickly at 35+ weeks and she lives over an hour away from the hospital.  She was advised to monitor her contractions.  Should she feel increasing contractions later in her pregnancy, we will consider moving her cesarean delivery to 37 to 38 weeks.  She is comfortable with this plan.  She will return in 1 week for another BPP.  The patient stated that all of her questions were answered.   A total of 20 minutes was spent counseling and coordinating the care for this patient.  Greater than 50% of the time was spent in direct face-to-face contact.

## 2024-03-26 NOTE — Telephone Encounter (Signed)
 Returned call from 8:39 AM. Left patient a message to call the office to reschedule.

## 2024-03-27 ENCOUNTER — Encounter: Payer: Self-pay | Admitting: Obstetrics and Gynecology

## 2024-03-27 ENCOUNTER — Encounter: Payer: Self-pay | Admitting: Family Medicine

## 2024-03-27 ENCOUNTER — Encounter: Admitting: Family Medicine

## 2024-03-27 ENCOUNTER — Other Ambulatory Visit (HOSPITAL_COMMUNITY): Payer: Self-pay

## 2024-03-27 ENCOUNTER — Telehealth: Admitting: Family Medicine

## 2024-03-27 DIAGNOSIS — F419 Anxiety disorder, unspecified: Secondary | ICD-10-CM | POA: Diagnosis not present

## 2024-03-27 DIAGNOSIS — O0990 Supervision of high risk pregnancy, unspecified, unspecified trimester: Secondary | ICD-10-CM

## 2024-03-27 DIAGNOSIS — O99323 Drug use complicating pregnancy, third trimester: Secondary | ICD-10-CM | POA: Diagnosis not present

## 2024-03-27 DIAGNOSIS — Z3A34 34 weeks gestation of pregnancy: Secondary | ICD-10-CM

## 2024-03-27 DIAGNOSIS — O99820 Streptococcus B carrier state complicating pregnancy: Secondary | ICD-10-CM

## 2024-03-27 DIAGNOSIS — O99891 Other specified diseases and conditions complicating pregnancy: Secondary | ICD-10-CM

## 2024-03-27 DIAGNOSIS — F1191 Opioid use, unspecified, in remission: Secondary | ICD-10-CM | POA: Diagnosis not present

## 2024-03-27 DIAGNOSIS — F410 Panic disorder [episodic paroxysmal anxiety] without agoraphobia: Secondary | ICD-10-CM

## 2024-03-27 DIAGNOSIS — R8271 Bacteriuria: Secondary | ICD-10-CM

## 2024-03-27 DIAGNOSIS — Z98891 History of uterine scar from previous surgery: Secondary | ICD-10-CM

## 2024-03-27 DIAGNOSIS — O99343 Other mental disorders complicating pregnancy, third trimester: Secondary | ICD-10-CM

## 2024-03-27 DIAGNOSIS — O34219 Maternal care for unspecified type scar from previous cesarean delivery: Secondary | ICD-10-CM | POA: Diagnosis not present

## 2024-03-27 DIAGNOSIS — O288 Other abnormal findings on antenatal screening of mother: Secondary | ICD-10-CM | POA: Diagnosis not present

## 2024-03-27 DIAGNOSIS — M329 Systemic lupus erythematosus, unspecified: Secondary | ICD-10-CM | POA: Diagnosis not present

## 2024-03-27 DIAGNOSIS — R7689 Other specified abnormal immunological findings in serum: Secondary | ICD-10-CM

## 2024-03-27 MED ORDER — PREDNISONE 5 MG PO TABS
5.0000 mg | ORAL_TABLET | Freq: Every day | ORAL | 0 refills | Status: DC
  Filled 2024-03-27: qty 14, 14d supply, fill #0

## 2024-03-27 NOTE — Telephone Encounter (Signed)
 Appt rescheduled by Lola, MD. Concerns will be discussed during virtual visit today.

## 2024-03-27 NOTE — Telephone Encounter (Signed)
 Called [facility] office. They confirm receipt of referral but it was refused due to diagnosis of OUD and current prescription of Suboxone . Lola MD notified who will speak more with patient at next visit.

## 2024-03-27 NOTE — Progress Notes (Unsigned)
 I connected with Felicia Davila 03/28/24 at  4:15 PM EST by: MyChart video and verified that I am speaking with the correct person using two identifiers.  Patient is located in her car (pulled over for our visit) and provider is located [facility].     I discussed the limitations, risks, security and privacy concerns of performing an evaluation and management service by MyChart video and the availability of in person appointments. I also discussed with the patient that there may be a patient responsible charge related to this service. By engaging in this virtual visit, you consent to the provision of healthcare.  Additionally, you authorize for your insurance to be billed for the services provided during this visit.  The patient expressed understanding and agreed to proceed.  The following staff members participated in the virtual visit:   Donnice CHRISTELLA Carolus, MD, MPH, FAAFP Attending Family Medicine Physician, Faculty Practice Center for [facility], [facility]    PRENATAL VISIT NOTE  Subjective:  Felicia Davila is a 33 y.o. H1E8847 at [redacted]w[redacted]d  for virtual visit for ongoing prenatal care.  She is currently monitored for the following issues for this high-risk pregnancy and has Prior methotrexate  therapy; Glomus jugulare tumor (HCC); Murmur; Achilles tendon rupture, right, initial encounter; History of cesarean delivery; Supervision of high risk pregnancy, antepartum; Systemic lupus complicating pregnancy (HCC); Opioid use disorder in remission; History of cholestasis during pregnancy; Radiation exposure affecting pregnancy; Group B streptococcal bacteriuria; Hepatitis C antibody detected; Iron  deficiency anemia; Tachycardia; Anxiety; Asymptomatic bacteriuria; Panic disorder; and Chronic post-traumatic stress disorder (PTSD) on their problem list.  Patient reports frustration over inability to get into a new psychiatry office.  The following portions  of the patient's history were reviewed and updated as appropriate: allergies, current medications, past family history, past medical history, past social history, past surgical history and problem list.   Objective:  There were no vitals filed for this visit. Self-Obtained  Fetal Status:            PDMP reviewed this encounter.            Last UDS: Lab Results  Component Value Date   CREATIUR 28 03/13/2024     Assessment and Plan:  Pregnancy: H1E8847 at [redacted]w[redacted]d 1. Supervision of high risk pregnancy, antepartum (Primary) Has primary OB office appt in two days, OB issues not addressed  2. Anxiety See below Cont xanax  1 mg TID, previously counseled - Ambulatory referral to Psychiatry  3. Panic disorder See below - Ambulatory referral to Psychiatry  4. Opioid use disorder in remission UDS appropriate from last visit, receiving rx from [facility] medical Current TDD 16 mg (8/4/4) Patient has now been rejected from two psychiatry practices she was trying to transfer to due to being on buprenorphine  as well as having the above diagnosis in her chart I explained that unfortunately this is very common in my experience, and that many mental health providers unfortunately will not agree to take patients with this diagnosis In addition sometimes they will not see patients who take buprenorphine , regardless of the reason why Regardless, patient unhappy with this diagnosis as she feels it is not accurate On exploring her chart further I reviewed her PDMP in detail (see screen shots above) It appears she was initially placed on opioid pain medicines due to pain from her ankle fracture/achilles rupture in summer of 2024 She confirms that history which is concordant with her PDMP She then appears to have been  started on buprenorphine  in 12/2022 I suspect this was done because she was already on long term benzodiazapenes and the provider was worried about respiratory depression and  overdose with this combination Unfortunately I do not have the records to review to confirm this Patient herself denies any misuse or illicit use of opioids I told her that I want her to fill out a ROI for [facility] medical records so that I can review the day she was started on buprenorphine  I agree that there seems to be little evidence for a use disorder and that she was put on it for chronic pain indications, but that I can't change the diagnosis until I have the full chart to review She will sign ROI when she goes to [facility] in a few days and hopefully we can get it cleared up soon Regarding transfer of her psychiatric care I recommended we try to get her in with provider at [facility] in [place], as I have been in dialogue with them about accepting my referrals from the [facility] clinic and I think we are likely to have success with them  5. Asymptomatic bacteriuria TOC collected at last visit but not yet resulted, f/u at visit in two days  6. Group B streptococcal bacteriuria Ppx if labors  7. Hepatitis C antibody detected Neg viral load  8. History of cesarean delivery Scheduled for RCS with me at 39 wks Anesthesia reviewed chart in depth,  Ongoing discussion about BTL Concerns about anesthesia given her extensive allergy list, per consult with anesthesia they report the following: After looking at her allergy list, it was documented that she does not actually have an allergy to morphine. She has received it in the past without complications. Also, she has received fentanyl  multiple times in the past without complications. So, I would discuss these allergies with her, and if in fact what is documented is true, I would still do a spinal with Duramorph and fentanyl . We could use IV dilaudid  afterwards, if necessary. Did not address this with her during visit, will forward to Dr. Cleatus to discuss  9. Systemic lupus complicating pregnancy (HCC) SSA Ab's negative on 01/12/2024 F/w  MFM, normal growth to date  Preterm labor symptoms and general obstetric precautions including but not limited to vaginal bleeding, contractions, leaking of fluid and fetal movement were reviewed in detail with the patient.  Return in about 2 days (around 03/29/2024) for ob visit, HRC.  Future Appointments  Date Time Provider Department Center  03/29/2024  1:30 PM [name], MD [facility]  04/02/2024  1:15 PM [facility] PROVIDER 1 [facility]  04/02/2024  1:30 PM [facility] US2 [facility]  04/09/2024  2:15 PM [facility] PROVIDER 1 [facility]  04/09/2024  2:30 PM [facility] US3 [facility]  04/10/2024  2:35 PM [name], MD [facility]  04/12/2024  9:30 AM [name], MD [facility]  04/16/2024  1:15 PM [facility] PROVIDER 1 [facility]  04/16/2024  1:30 PM [facility] US1 [facility]     Time spent on virtual visit: 60 minutes  Donnice HERO Lola, MD

## 2024-03-28 ENCOUNTER — Encounter: Payer: Self-pay | Admitting: Obstetrics and Gynecology

## 2024-03-28 ENCOUNTER — Encounter: Payer: Self-pay | Admitting: Family Medicine

## 2024-03-28 ENCOUNTER — Other Ambulatory Visit (HOSPITAL_COMMUNITY): Payer: Self-pay

## 2024-03-28 DIAGNOSIS — Z885 Allergy status to narcotic agent status: Secondary | ICD-10-CM | POA: Insufficient documentation

## 2024-03-28 NOTE — Patient Instructions (Signed)

## 2024-03-28 NOTE — Progress Notes (Unsigned)
 OBSTETRICS PRENATAL VIRTUAL VISIT ENCOUNTER NOTE  Provider location: Center for [facility] [place]   Patient location: Home  I connected with Felicia Davila on 03/29/24 at  1:30 PM EST by MyChart Video Encounter and verified that I am speaking with the correct person using two identifiers. I discussed the limitations, risks, security and privacy concerns of performing an evaluation and management service virtually and the availability of in person appointments. I also discussed with the patient that there may be a patient responsible charge related to this service. The patient expressed understanding and agreed to proceed. Subjective:  Felicia Davila is a 33 y.o. [phone] at [redacted]w[redacted]d being seen today for ongoing prenatal care.  She is currently monitored for the following issues for this high-risk pregnancy and has Prior methotrexate  therapy; Glomus jugulare tumor (HCC); Murmur; Achilles tendon rupture, right, initial encounter; History of cesarean delivery; Supervision of high risk pregnancy, antepartum; Systemic lupus complicating pregnancy (HCC); Opioid use disorder in remission; History of cholestasis during pregnancy; Radiation exposure affecting pregnancy; Group B streptococcal bacteriuria; Hepatitis C antibody detected; Iron  deficiency anemia; Tachycardia; Anxiety; Asymptomatic bacteriuria; Panic disorder; Chronic post-traumatic stress disorder (PTSD); and 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  Patient reports no specific concerns. .  Contractions: Irregular. Vag. Bleeding: None.  Movement: Present. Denies any leaking of fluid.   The following portions of the patient's history were reviewed and updated as appropriate: allergies, current medications, past family history, past medical history, past social history, past surgical history and problem list.   Objective:    There were no vitals filed for this visit.  Fetal Status:      Movement: Present    General:  Alert, oriented and cooperative. Patient is in no acute distress.  Respiratory: Normal respiratory effort, no problems with respiration noted  Mental Status: Normal mood and affect. Normal behavior. Normal judgment and thought content.  Rest of physical exam deferred due to type of encounter  Assessment and Plan:  Pregnancy: [id number] at [redacted]w[redacted]d 1. Supervision of high risk pregnancy, antepartum Routine PNC up to date.  Asked pt if she has yet had anesthesia consult: has not yet heard from them.  Reviewed morphine allergy: Has had plenty of surgeries since then. She had a spinal with her second c-section and had no issue. She had that c-section in [place].   2. Pregnancy with 34 completed weeks gestation  3. Hepatitis C antibody detected (Primary) RNA undetectable in November.   4. History of cesarean delivery RLTCS scheduled for 05/03/23 currently.   5. Systemic lupus complicating pregnancy (HCC) Started prednisone  for recent mild flare. No other meds.   6. History of cholestasis during pregnancy No symptoms currently.   7. Iron  deficiency anemia secondary to inadequate dietary iron  intake Last CBC normal.   8. Group B streptococcal bacteriuria  9. Opioid use disorder in remission Following with [facility]. Possibly to remove this specific diagnosis but Dr. Lola checking on records from [place]. Plans to sign release of record.    Preterm labor symptoms and general obstetric precautions including but not limited to vaginal bleeding, contractions, leaking of fluid and fetal movement were reviewed in detail with the patient. I discussed the assessment and treatment plan with the patient. The patient was provided an opportunity to ask questions and all were answered. The patient agreed with the plan and demonstrated an understanding of the instructions. The patient was advised to call back or seek an in-person office evaluation/go to [facility]U at [facility] for any urgent  or concerning symptoms. Please refer to After Visit Summary for other counseling recommendations.   I provided 15 minutes of face-to-face time during this encounter.  Return in about 1 week (around 04/05/2024) for OB VISIT, MD or APP.  Future Appointments  Date Time Provider Department Center  04/02/2024  1:15 PM [facility] PROVIDER 1 [facility]  04/02/2024  1:30 PM [facility] US2 [facility]  04/09/2024  2:15 PM [facility] PROVIDER 1 [facility]  04/09/2024  2:30 PM [facility] US3 [facility]  04/10/2024  2:35 PM [name], MD [facility]  04/12/2024  9:30 AM [name], MD [facility]  04/16/2024  1:15 PM [facility] PROVIDER 1 [facility]  04/16/2024  1:30 PM [facility] US1 [facility]   Vina Solian, MD Center for [facility]

## 2024-03-29 ENCOUNTER — Encounter: Payer: Self-pay | Admitting: Obstetrics and Gynecology

## 2024-03-29 ENCOUNTER — Telehealth: Admitting: Obstetrics and Gynecology

## 2024-03-29 DIAGNOSIS — R7689 Other specified abnormal immunological findings in serum: Secondary | ICD-10-CM

## 2024-03-29 DIAGNOSIS — Z3A34 34 weeks gestation of pregnancy: Secondary | ICD-10-CM

## 2024-03-29 DIAGNOSIS — O0990 Supervision of high risk pregnancy, unspecified, unspecified trimester: Secondary | ICD-10-CM | POA: Diagnosis not present

## 2024-03-29 DIAGNOSIS — D508 Other iron deficiency anemias: Secondary | ICD-10-CM

## 2024-03-29 DIAGNOSIS — Z8759 Personal history of other complications of pregnancy, childbirth and the puerperium: Secondary | ICD-10-CM

## 2024-03-29 DIAGNOSIS — F1191 Opioid use, unspecified, in remission: Secondary | ICD-10-CM

## 2024-03-29 DIAGNOSIS — O99891 Other specified diseases and conditions complicating pregnancy: Secondary | ICD-10-CM

## 2024-03-29 DIAGNOSIS — M329 Systemic lupus erythematosus, unspecified: Secondary | ICD-10-CM

## 2024-03-29 DIAGNOSIS — Z98891 History of uterine scar from previous surgery: Secondary | ICD-10-CM | POA: Diagnosis not present

## 2024-03-29 DIAGNOSIS — R8271 Bacteriuria: Secondary | ICD-10-CM

## 2024-03-29 DIAGNOSIS — Z8719 Personal history of other diseases of the digestive system: Secondary | ICD-10-CM

## 2024-03-29 NOTE — Anesthesia Preprocedure Evaluation (Signed)
 Anesthesia Evaluation    Airway        Dental   Pulmonary Patient abstained from smoking., former smoker          Cardiovascular      Neuro/Psych    GI/Hepatic   Endo/Other    Renal/GU      Musculoskeletal   Abdominal   Peds  Hematology   Anesthesia Other Findings Pt has allergies to fentanyl  and morphine documented in her chart; however, after chart review, she has received both of these medications in the past without complications. In addition, she has received spinal anesthesia for a previous C/S at [facility] without complication. Will discuss allergies with patient and plan for spinal with fentanyl  and duramorph assuming these are not true allergies.   Reproductive/Obstetrics                              Anesthesia Physical Anesthesia Plan Anesthesia Quick Evaluation

## 2024-03-30 ENCOUNTER — Other Ambulatory Visit (HOSPITAL_COMMUNITY): Payer: Self-pay

## 2024-03-31 ENCOUNTER — Encounter: Payer: Self-pay | Admitting: Family Medicine

## 2024-03-31 ENCOUNTER — Encounter: Payer: Self-pay | Admitting: Obstetrics and Gynecology

## 2024-03-31 ENCOUNTER — Other Ambulatory Visit (HOSPITAL_COMMUNITY): Payer: Self-pay

## 2024-04-01 ENCOUNTER — Encounter (HOSPITAL_COMMUNITY): Payer: Self-pay | Admitting: Obstetrics and Gynecology

## 2024-04-01 ENCOUNTER — Encounter (HOSPITAL_COMMUNITY): Payer: Self-pay | Admitting: Obstetrics & Gynecology

## 2024-04-01 ENCOUNTER — Inpatient Hospital Stay (HOSPITAL_COMMUNITY)
Admission: AD | Admit: 2024-04-01 | Discharge: 2024-04-01 | Disposition: A | Discharge status: Home / Self Care | Attending: Obstetrics & Gynecology | Admitting: Obstetrics & Gynecology

## 2024-04-01 DIAGNOSIS — O99891 Other specified diseases and conditions complicating pregnancy: Secondary | ICD-10-CM

## 2024-04-01 DIAGNOSIS — Z79899 Other long term (current) drug therapy: Secondary | ICD-10-CM | POA: Diagnosis not present

## 2024-04-01 DIAGNOSIS — Z3A34 34 weeks gestation of pregnancy: Secondary | ICD-10-CM

## 2024-04-01 DIAGNOSIS — F32A Depression, unspecified: Secondary | ICD-10-CM

## 2024-04-01 LAB — URINALYSIS, ROUTINE W REFLEX MICROSCOPIC
Bilirubin Urine: NEGATIVE
Glucose, UA: NEGATIVE mg/dL
Ketones, ur: NEGATIVE mg/dL
Leukocytes,Ua: NEGATIVE
Nitrite: POSITIVE — AB
Protein, ur: NEGATIVE mg/dL
Specific Gravity, Urine: 1.013 (ref 1.005–1.030)
pH: 6 (ref 5.0–8.0)

## 2024-04-01 MED ORDER — FLUOXETINE HCL 20 MG PO CAPS
20.0000 mg | ORAL_CAPSULE | Freq: Every day | ORAL | Status: DC
  Administered 2024-04-01: 20 mg via ORAL
  Filled 2024-04-01 (×2): qty 1

## 2024-04-01 MED ORDER — ALPRAZOLAM 0.5 MG PO TABS
1.0000 mg | ORAL_TABLET | Freq: Once | ORAL | Status: AC
  Administered 2024-04-01: 1 mg via ORAL
  Filled 2024-04-01: qty 2
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
## 2024-04-01 NOTE — Discharge Instructions (Signed)
 I will call the pharmacy to fix the issue. I have also sent a message to the office to make sure they follow up as well.

## 2024-04-01 NOTE — MAU Note (Signed)
..  Felicia Davila is a 33 y.o. at [redacted]w[redacted]d here in MAU reporting: Pharmacy could not fill prescription and has been 24 hrs without xanax  and prozac . Since not being able to take medication has had panic attacks, headache, shortness of breath, and feels hot.   Contractions since around 1500 and are 7 minutes apart. Is also having pelvic pressure.  Has been leaking fluid since last night, reports pany-liner was full and some on her underwear.  Pain score: contractions 6/10; headache 8/10 Vitals:   04/01/24 2020  BP: 113/85  Pulse: (!) 111  Resp: 17  Temp: 98.2 F (36.8 C)  SpO2: 100%     FHT:145 Lab orders placed from triage: UA

## 2024-04-02 ENCOUNTER — Encounter: Payer: Self-pay | Admitting: Family Medicine

## 2024-04-02 ENCOUNTER — Other Ambulatory Visit: Payer: Self-pay | Admitting: Family Medicine

## 2024-04-02 ENCOUNTER — Ambulatory Visit: Attending: Obstetrics and Gynecology

## 2024-04-02 ENCOUNTER — Ambulatory Visit: Admitting: Obstetrics and Gynecology

## 2024-04-02 ENCOUNTER — Other Ambulatory Visit (HOSPITAL_COMMUNITY): Payer: Self-pay

## 2024-04-02 VITALS — BP 125/76 | HR 108

## 2024-04-02 DIAGNOSIS — F1191 Opioid use, unspecified, in remission: Secondary | ICD-10-CM

## 2024-04-02 DIAGNOSIS — Z8759 Personal history of other complications of pregnancy, childbirth and the puerperium: Secondary | ICD-10-CM | POA: Diagnosis not present

## 2024-04-02 DIAGNOSIS — F1120 Opioid dependence, uncomplicated: Secondary | ICD-10-CM

## 2024-04-02 DIAGNOSIS — O99891 Other specified diseases and conditions complicating pregnancy: Secondary | ICD-10-CM | POA: Diagnosis not present

## 2024-04-02 DIAGNOSIS — O09299 Supervision of pregnancy with other poor reproductive or obstetric history, unspecified trimester: Secondary | ICD-10-CM

## 2024-04-02 DIAGNOSIS — Z8719 Personal history of other diseases of the digestive system: Secondary | ICD-10-CM

## 2024-04-02 DIAGNOSIS — M329 Systemic lupus erythematosus, unspecified: Secondary | ICD-10-CM

## 2024-04-02 DIAGNOSIS — Z3A34 34 weeks gestation of pregnancy: Secondary | ICD-10-CM | POA: Diagnosis not present

## 2024-04-02 DIAGNOSIS — F410 Panic disorder [episodic paroxysmal anxiety] without agoraphobia: Secondary | ICD-10-CM

## 2024-04-02 DIAGNOSIS — F419 Anxiety disorder, unspecified: Secondary | ICD-10-CM

## 2024-04-02 DIAGNOSIS — Z98891 History of uterine scar from previous surgery: Secondary | ICD-10-CM

## 2024-04-02 MED ORDER — BUPRENORPHINE HCL 8 MG SL SUBL
8.0000 mg | SUBLINGUAL_TABLET | Freq: Three times a day (TID) | SUBLINGUAL | 1 refills | Status: DC
  Filled 2024-04-02 – 2024-04-13 (×3): qty 90, 30d supply, fill #0
  Filled 2024-05-09 – 2024-05-11 (×3): qty 90, 30d supply, fill #1

## 2024-04-02 MED ORDER — ALPRAZOLAM 1 MG PO TABS
1.0000 mg | ORAL_TABLET | Freq: Three times a day (TID) | ORAL | 0 refills | Status: DC | PRN
  Filled 2024-04-02: qty 90, 30d supply, fill #0

## 2024-04-02 MED ORDER — ALPRAZOLAM 1 MG PO TABS
1.0000 mg | ORAL_TABLET | Freq: Three times a day (TID) | ORAL | 1 refills | Status: AC | PRN
  Filled 2024-04-02: qty 90, 30d supply, fill #0
  Filled 2024-04-26 – 2024-04-30 (×2): qty 90, 30d supply, fill #1

## 2024-04-02 NOTE — Progress Notes (Signed)
 Maternal-Fetal Medicine Consultation  Name: Bryannah Boston  MRN: [id number]  GA: [id number] [redacted]w[redacted]d   Patient is here for antenatal testing. - Systemic lupus erythematosus (SLE).  Patient had flares last week, she had rashes in the face and joint pains.  She was prescribed prednisone .  Patient reports she takes 5 mg daily and her symptoms have improved and she does not have a place now.  Patient takes Plaquenil  daily. - Suboxone  maintenance. Patient was evaluated at the MAU to rule out labor.  NST was reactive and patient was not in labor.  Ultrasound Normal amniotic fluid.  Cephalic presentation.  Antenatal testing is reassuring.  BPP 8/8.  I discussed the importance of controlling flares that can be associated with adverse outcomes including preterm labor.  Maintenance steroids may be required if the patient continues to have flares. Patient has recurrent flares, delivery at 37-or 38-weeks' gestation could be considered.  Recommendations -Continue weekly antenatal testing till delivery.     Consultation including face-to-face (more than 50%) counseling 20 minutes.

## 2024-04-02 NOTE — Telephone Encounter (Signed)
 Addressed in another encounter

## 2024-04-02 NOTE — Progress Notes (Signed)
 Called and spoke with pharmacy regarding xanax  rx, see MAU note from last night regarding details.   They report that rx was inadvertently cancelled by Dr. Cleatus in November and that is what is causing a hold on further refills by the pharmacy.  Only way to overcome this is to send a new rx. I reported that I had hoped to avoid that as I do not want to compromise her relationship with her other prescriber. Unfortunately no other way to resolve it other than that prescriber Felicia Davila) sending in a new refill.   Pharmacist sent a request to Felicia Davila and I also sent a new rx in case he was not able to respond in a timely fashion.

## 2024-04-05 ENCOUNTER — Encounter: Payer: Self-pay | Admitting: Family Medicine

## 2024-04-09 ENCOUNTER — Ambulatory Visit

## 2024-04-10 ENCOUNTER — Encounter: Admitting: Family Medicine

## 2024-04-10 ENCOUNTER — Other Ambulatory Visit (HOSPITAL_COMMUNITY): Payer: Self-pay

## 2024-04-11 ENCOUNTER — Other Ambulatory Visit (HOSPITAL_COMMUNITY): Payer: Self-pay

## 2024-04-11 ENCOUNTER — Encounter: Payer: Self-pay | Admitting: Obstetrics and Gynecology

## 2024-04-11 ENCOUNTER — Encounter: Payer: Self-pay | Admitting: Family Medicine

## 2024-04-12 ENCOUNTER — Ambulatory Visit: Admitting: Obstetrics and Gynecology

## 2024-04-12 ENCOUNTER — Other Ambulatory Visit (HOSPITAL_COMMUNITY)
Admission: RE | Admit: 2024-04-12 | Discharge: 2024-04-12 | Disposition: A | Discharge status: Home / Self Care | Source: Ambulatory Visit | Attending: Obstetrics and Gynecology | Admitting: Obstetrics and Gynecology

## 2024-04-12 VITALS — BP 108/70 | HR 92 | Wt 199.1 lb

## 2024-04-12 DIAGNOSIS — Z113 Encounter for screening for infections with a predominantly sexual mode of transmission: Secondary | ICD-10-CM | POA: Diagnosis present

## 2024-04-12 DIAGNOSIS — O99893 Other specified diseases and conditions complicating puerperium: Secondary | ICD-10-CM

## 2024-04-12 DIAGNOSIS — F4312 Post-traumatic stress disorder, chronic: Secondary | ICD-10-CM

## 2024-04-12 DIAGNOSIS — Z3A36 36 weeks gestation of pregnancy: Secondary | ICD-10-CM | POA: Diagnosis not present

## 2024-04-12 DIAGNOSIS — R8271 Bacteriuria: Secondary | ICD-10-CM | POA: Diagnosis not present

## 2024-04-12 DIAGNOSIS — Z98891 History of uterine scar from previous surgery: Secondary | ICD-10-CM | POA: Diagnosis not present

## 2024-04-12 DIAGNOSIS — F419 Anxiety disorder, unspecified: Secondary | ICD-10-CM

## 2024-04-12 DIAGNOSIS — O99891 Other specified diseases and conditions complicating pregnancy: Secondary | ICD-10-CM

## 2024-04-12 DIAGNOSIS — Z885 Allergy status to narcotic agent status: Secondary | ICD-10-CM

## 2024-04-12 DIAGNOSIS — F1191 Opioid use, unspecified, in remission: Secondary | ICD-10-CM

## 2024-04-12 DIAGNOSIS — M329 Systemic lupus erythematosus, unspecified: Secondary | ICD-10-CM

## 2024-04-12 DIAGNOSIS — D508 Other iron deficiency anemias: Secondary | ICD-10-CM

## 2024-04-12 DIAGNOSIS — O0993 Supervision of high risk pregnancy, unspecified, third trimester: Secondary | ICD-10-CM | POA: Diagnosis not present

## 2024-04-12 DIAGNOSIS — O0990 Supervision of high risk pregnancy, unspecified, unspecified trimester: Secondary | ICD-10-CM

## 2024-04-12 DIAGNOSIS — R000 Tachycardia, unspecified: Secondary | ICD-10-CM

## 2024-04-12 DIAGNOSIS — O99343 Other mental disorders complicating pregnancy, third trimester: Secondary | ICD-10-CM

## 2024-04-13 ENCOUNTER — Other Ambulatory Visit (HOSPITAL_COMMUNITY): Payer: Self-pay

## 2024-04-13 ENCOUNTER — Encounter: Payer: Self-pay | Admitting: Obstetrics and Gynecology

## 2024-04-13 ENCOUNTER — Other Ambulatory Visit: Payer: Self-pay | Admitting: Obstetrics and Gynecology

## 2024-04-13 ENCOUNTER — Ambulatory Visit: Payer: Self-pay | Admitting: Obstetrics and Gynecology

## 2024-04-13 ENCOUNTER — Other Ambulatory Visit: Payer: Self-pay

## 2024-04-13 DIAGNOSIS — D6862 Lupus anticoagulant syndrome: Secondary | ICD-10-CM

## 2024-04-13 DIAGNOSIS — F1320 Sedative, hypnotic or anxiolytic dependence, uncomplicated: Secondary | ICD-10-CM | POA: Insufficient documentation

## 2024-04-13 DIAGNOSIS — O0990 Supervision of high risk pregnancy, unspecified, unspecified trimester: Secondary | ICD-10-CM

## 2024-04-13 LAB — CBC WITH DIFFERENTIAL/PLATELET
Basophils Absolute: 0 x10E3/uL (ref 0.0–0.2)
Basos: 0 %
EOS (ABSOLUTE): 0.1 x10E3/uL (ref 0.0–0.4)
Eos: 1 %
Hematocrit: 32.9 % — ABNORMAL LOW (ref 34.0–46.6)
Hemoglobin: 10.6 g/dL — ABNORMAL LOW (ref 11.1–15.9)
Immature Grans (Abs): 0 x10E3/uL (ref 0.0–0.1)
Immature Granulocytes: 0 %
Lymphocytes Absolute: 0.9 x10E3/uL (ref 0.7–3.1)
Lymphs: 14 %
MCH: 28.3 pg (ref 26.6–33.0)
MCHC: 32.2 g/dL (ref 31.5–35.7)
MCV: 88 fL (ref 79–97)
Monocytes Absolute: 0.7 x10E3/uL (ref 0.1–0.9)
Monocytes: 11 %
Neutrophils Absolute: 4.8 x10E3/uL (ref 1.4–7.0)
Neutrophils: 73 %
Platelets: 226 x10E3/uL (ref 150–450)
RBC: 3.74 x10E6/uL — ABNORMAL LOW (ref 3.77–5.28)
RDW: 12.7 % (ref 11.7–15.4)
WBC: 6.6 x10E3/uL (ref 3.4–10.8)

## 2024-04-13 LAB — CERVICOVAGINAL ANCILLARY ONLY
Chlamydia: NEGATIVE
Comment: NEGATIVE
Comment: NORMAL
Neisseria Gonorrhea: NEGATIVE

## 2024-04-13 LAB — FERRITIN: Ferritin: 14 ng/mL — ABNORMAL LOW (ref 15–150)

## 2024-04-13 NOTE — Progress Notes (Signed)
 "  PRENATAL VISIT NOTE  Subjective:  Felicia Davila is a 33 y.o. [phone] at [redacted]w[redacted]d being seen today for ongoing prenatal care.  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  Patient reports feeling extreme fatigue and heaviness.  Contractions: Irregular. Vag. Bleeding: None.  Movement: Present. Denies leaking of fluid.   The following portions of the patient's history were reviewed and updated as appropriate: allergies, current medications, past family history, past medical history, past social history, past surgical history and problem list.   Objective:   Vitals:   04/12/24 0933  BP: 108/70  Pulse: 92  Weight: 199 lb 1.9 oz (90.3 kg)    Fetal Status: Fetal Heart Rate (bpm): 135   Movement: Present     General:  Alert, oriented and cooperative. Patient is in no acute distress.  Skin: Skin is warm and dry. No rash noted.   Cardiovascular: Normal heart rate noted  Respiratory: Normal respiratory effort, no problems with respiration noted  Abdomen: Soft, gravid, appropriate for gestational age.  Pain/Pressure: Present      Assessment and Plan:  Pregnancy: [id number] at [redacted]w[redacted]d 1. [redacted] weeks gestation of pregnancy (Primary) 2. Supervision of high risk pregnancy, antepartum - Cervicovaginal ancillary only  3. History of cesarean delivery - We discussed her history of c-section. Her previous c-section was due to  arrest of dilation at  4-6 cm followed by a scheduled repeat.  She has a history of  no prior successful vaginal deliveries - We discussed the risks associated with repeat c-section: bleeding, infection, injury to surrounding organs/tissues I.e. bowel/bladder, development of scar tissue, wound complications such as wound separation or infection, need for additional surgery, percreta/acreta - We discussed the risks associated with TOLAC: risk of it being unsuccessful, specially in the context of her history, the risks in general of a vaginal delivery (prolapse, SUI, differences in recovery, pelvic floor dysfunction, etc), and the risk of uterine rupture. We discussed with the risk of uterine rupture that while rare it is not easily predicted, that it is a surgical emergency, and it can be potentially catastrophic for mom and baby. We discussed if uterine rupture that it may necessitate hysterectomy if the rupture caused issues with bleeding that could not be managed with other surgical options.  - After counseling, the patient was given the opportunity to ask questions and all questions answered.  - After considering her options, she would like to have a repeat c-section - CS already scheduled [facility]  4. Systemic lupus complicating pregnancy (HCC) Current meds: prednisone  5mg  daily Rheum referral placed but closed after attempts to contact patient were unsuccessful  Weekly antenatal testing, getting BPPS w/ MFM Growth 11/24 1950g (28%), next scheduled 12/22  5. Group B streptococcal bacteriuria PCN in labor (if TOLAC but planning rCS)  6. Iron  deficiency anemia secondary to inadequate dietary iron  intake Completed 3 out of 5 iron  infusions but had issues with no shows at appt Due to symptoms of anemia, we will recheck her CBC/ferritin today. Will likely need a new auth to complete iron  infusions if indicated -  CBC with Differential/Platelet - Ferritin  7. Chronic post-traumatic stress disorder (PTSD) 9.  Opioid use disorder in remission Current meds: xanax  TID prn, prozac , subutex  F/w REACH [place] following up on records from [place] to remove OUD from problem list. ROI to be signed today Psychiatry referral pending  8. 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   Future Appointments  Date Time Provider Department Center  04/16/2024  1:15 PM [facility] PROVIDER 1 [facility]  04/16/2024  1:30 PM [facility] US1 [facility]  04/24/2024  9:30 AM Rasch, Delon FERNS, NP [facility]    Kieth JAYSON Carolin, MD  "

## 2024-04-16 ENCOUNTER — Encounter: Payer: Self-pay | Admitting: General Practice

## 2024-04-16 ENCOUNTER — Ambulatory Visit

## 2024-04-16 ENCOUNTER — Ambulatory Visit: Attending: Obstetrics and Gynecology | Admitting: Obstetrics

## 2024-04-16 VITALS — BP 107/75 | HR 91

## 2024-04-16 DIAGNOSIS — F419 Anxiety disorder, unspecified: Secondary | ICD-10-CM

## 2024-04-16 DIAGNOSIS — O2623 Pregnancy care for patient with recurrent pregnancy loss, third trimester: Secondary | ICD-10-CM | POA: Diagnosis not present

## 2024-04-16 DIAGNOSIS — Z98891 History of uterine scar from previous surgery: Secondary | ICD-10-CM

## 2024-04-16 DIAGNOSIS — M329 Systemic lupus erythematosus, unspecified: Secondary | ICD-10-CM

## 2024-04-16 DIAGNOSIS — O99343 Other mental disorders complicating pregnancy, third trimester: Secondary | ICD-10-CM | POA: Diagnosis not present

## 2024-04-16 DIAGNOSIS — Z3A36 36 weeks gestation of pregnancy: Secondary | ICD-10-CM

## 2024-04-16 DIAGNOSIS — O09293 Supervision of pregnancy with other poor reproductive or obstetric history, third trimester: Secondary | ICD-10-CM | POA: Diagnosis not present

## 2024-04-16 DIAGNOSIS — O26893 Other specified pregnancy related conditions, third trimester: Secondary | ICD-10-CM | POA: Diagnosis not present

## 2024-04-16 DIAGNOSIS — O99323 Drug use complicating pregnancy, third trimester: Secondary | ICD-10-CM | POA: Insufficient documentation

## 2024-04-16 DIAGNOSIS — O99891 Other specified diseases and conditions complicating pregnancy: Secondary | ICD-10-CM

## 2024-04-16 DIAGNOSIS — Z362 Encounter for other antenatal screening follow-up: Secondary | ICD-10-CM | POA: Insufficient documentation

## 2024-04-16 DIAGNOSIS — F1120 Opioid dependence, uncomplicated: Secondary | ICD-10-CM | POA: Diagnosis not present

## 2024-04-16 DIAGNOSIS — O34219 Maternal care for unspecified type scar from previous cesarean delivery: Secondary | ICD-10-CM | POA: Diagnosis not present

## 2024-04-16 DIAGNOSIS — O09299 Supervision of pregnancy with other poor reproductive or obstetric history, unspecified trimester: Secondary | ICD-10-CM

## 2024-04-16 NOTE — Progress Notes (Signed)
 MFM Consult Note  Felicia Davila is currently at [redacted]w[redacted]d. She has been followed due to maternal lupus and maternal treatment with Subutex .    She denies any problems since her last exam and denies feeling frequent contractions.  Her blood pressure today was 107/75.  Sonographic findings Single intrauterine pregnancy at 36w 5d. Fetal cardiac activity: Observed. Presentation: Cephalic. Fetal biometry shows the estimated fetal weight of 5 lb 11 oz,  2584g (15%). Amniotic fluid: Within normal limits. AFI: 9.98 cm.  MVP: 3.23 cm. Placenta: Anterior. BPP: 8/8.   As she has 2 prior cesarean deliveries, she is already scheduled for a repeat cesarean delivery on May 02, 2024.    We will continue to follow her with weekly fetal testing until delivery.    She will return in 1 week for an NST.  We will assess the frequency of her contractions during that exam.    The patient had concerns regarding the timing of delivery as she lives over an hour away from the hospital.  She was advised that we will try to keep her delivery as scheduled on January 7, unless frequent contractions are noted on her NST next week.  The patient stated that all of her questions were answered.   A total of 20 minutes was spent counseling and coordinating the care for this patient.  Greater than 50% of the time was spent in direct face-to-face contact.

## 2024-04-17 ENCOUNTER — Telehealth (INDEPENDENT_AMBULATORY_CARE_PROVIDER_SITE_OTHER): Payer: Self-pay | Admitting: Family Medicine

## 2024-04-17 ENCOUNTER — Encounter: Payer: Self-pay | Admitting: Family Medicine

## 2024-04-17 DIAGNOSIS — F1191 Opioid use, unspecified, in remission: Secondary | ICD-10-CM

## 2024-04-17 DIAGNOSIS — O99323 Drug use complicating pregnancy, third trimester: Secondary | ICD-10-CM | POA: Diagnosis not present

## 2024-04-17 DIAGNOSIS — Z3A36 36 weeks gestation of pregnancy: Secondary | ICD-10-CM

## 2024-04-17 DIAGNOSIS — Z98891 History of uterine scar from previous surgery: Secondary | ICD-10-CM

## 2024-04-17 DIAGNOSIS — O99891 Other specified diseases and conditions complicating pregnancy: Secondary | ICD-10-CM | POA: Diagnosis not present

## 2024-04-17 DIAGNOSIS — R8271 Bacteriuria: Secondary | ICD-10-CM

## 2024-04-17 DIAGNOSIS — M329 Systemic lupus erythematosus, unspecified: Secondary | ICD-10-CM

## 2024-04-17 DIAGNOSIS — O34219 Maternal care for unspecified type scar from previous cesarean delivery: Secondary | ICD-10-CM | POA: Diagnosis not present

## 2024-04-17 DIAGNOSIS — O0993 Supervision of high risk pregnancy, unspecified, third trimester: Secondary | ICD-10-CM | POA: Diagnosis not present

## 2024-04-17 DIAGNOSIS — R7689 Other specified abnormal immunological findings in serum: Secondary | ICD-10-CM

## 2024-04-17 DIAGNOSIS — F419 Anxiety disorder, unspecified: Secondary | ICD-10-CM | POA: Diagnosis not present

## 2024-04-17 DIAGNOSIS — F410 Panic disorder [episodic paroxysmal anxiety] without agoraphobia: Secondary | ICD-10-CM | POA: Diagnosis not present

## 2024-04-17 DIAGNOSIS — O0990 Supervision of high risk pregnancy, unspecified, unspecified trimester: Secondary | ICD-10-CM

## 2024-04-17 DIAGNOSIS — O99343 Other mental disorders complicating pregnancy, third trimester: Secondary | ICD-10-CM | POA: Diagnosis not present
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
## 2024-04-17 NOTE — Patient Instructions (Signed)

## 2024-04-17 NOTE — Progress Notes (Signed)
 "   I connected with Felicia Davila 04/17/2024 at  2:15 PM EST by: MyChart video and verified that I am speaking with the correct person using two identifiers.  Patient is located at a [place] and provider is located at [facility].     I discussed the limitations, risks, security and privacy concerns of performing an evaluation and management service by MyChart video and the availability of in person appointments. I also discussed with the patient that there may be a patient responsible charge related to this service. By engaging in this virtual visit, you consent to the provision of healthcare.  Additionally, you authorize for your insurance to be billed for the services provided during this visit.  The patient expressed understanding and agreed to proceed.  The following staff members participated in the virtual visit:  Donnice CHRISTELLA Carolus, MD, MPH, FAAFP Attending Family Medicine Physician, Faculty Practice Center for [facility], [facility]   PRENATAL VISIT NOTE  Subjective:  Felicia Davila is a 33 y.o. H1E8847 at [redacted]w[redacted]d  for virtual visit for ongoing prenatal care.  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  Patient reports no complaints.  Contractions: Irritability. Vag. Bleeding: None.  Movement: Present. Denies leaking of fluid.   The following portions of the patient's history were reviewed  and updated as appropriate: allergies, current medications, past family history, past medical history, past social history, past surgical history and problem list.   Objective:  There were no vitals filed for this visit. Self-Obtained  Fetal Status:     Movement: Present     I have reviewed the PDMP during this encounter.     Last UDS:       Assessment and Plan:  Pregnancy: [id number] at [redacted]w[redacted]d 1. Supervision of high risk pregnancy, antepartum (Primary) OB issues not addressed at this visit Reports normal fetal movement to me Seen by MFM yesterday, normal BP and BPP 8/8 that day. Growth US  also done with normal growth, 15%, AFI 9.9 cm  2. Anxiety Reports she may have found a provider to do her med management Went to [facility] but did not like the psychiatrist Also went to [facility] in [place], had a much more positive visit and is planning to go there for bup taper and maintain xanax  with plan to taper in the future That doctor wrote a prescription but having pharmacy issues, but hopefully it is going to be taken care of going forward  3. Panic disorder See above  4. Opioid use disorder in remission Currently reports she has self weaned Buprenorphine  again to 4 mg BID Would like to continue this lower dose after delivery which is fine Has previously been counseled on dangers of tapering/inducing preterm delivery We are still waiting on ROI from [facility] to get further clarification on how she was put on buprenorphine  and why, see note from 03/27/2024 for more detail on this  5. Group B streptococcal bacteriuria Ppx at time of cesarean  6. Hepatitis C antibody detected Negative viral load  7. History of cesarean  delivery Majority of visit spent discussing pre op planning, pre op visit with nurse, and how actual day will go Also discussed normal sensations that most patients will feel during cesarean and that I have a low threshold to convert to  general anesthesia if a patient is uncomfortable She also asked why we do spinal vs CSE, discussed rationale, general length of surgery, etc.  We also reviewed post op pain control, importance of continuing suboxone  and using PRN oxy on top to control pain See note from 03/27/2024 regarding anesthesia consult for patient Already has RCS scheduled with me for 05/03/2023  8. Systemic lupus complicating pregnancy (HCC) SSA/SSB Ab's negative 01/12/2024 Normal growth US  to date  Preterm labor symptoms and general obstetric precautions including but not limited to vaginal bleeding, contractions, leaking of fluid and fetal movement were reviewed in detail with the patient.  No follow-ups on file.  Future Appointments  Date Time Provider Department Center  04/23/2024  1:00 PM [facility] NURSE [facility]  04/23/2024  1:15 PM [facility] NST [facility]  04/24/2024  1:30 PM Rasch, Delon FERNS, NP [facility]  04/30/2024  1:00 PM [facility] PROVIDER 1 [facility]  04/30/2024  1:15 PM [facility] NST [facility]     Time spent on virtual visit: 25 minutes  Donnice CHRISTELLA Carolus, MD  "

## 2024-04-23 ENCOUNTER — Ambulatory Visit: Attending: Obstetrics and Gynecology | Admitting: Obstetrics

## 2024-04-23 ENCOUNTER — Ambulatory Visit: Admitting: *Deleted

## 2024-04-23 ENCOUNTER — Encounter: Payer: Self-pay | Admitting: Obstetrics and Gynecology

## 2024-04-23 VITALS — BP 111/71 | HR 79

## 2024-04-23 DIAGNOSIS — Z3A37 37 weeks gestation of pregnancy: Secondary | ICD-10-CM | POA: Diagnosis not present

## 2024-04-23 DIAGNOSIS — O99891 Other specified diseases and conditions complicating pregnancy: Secondary | ICD-10-CM | POA: Diagnosis present

## 2024-04-23 DIAGNOSIS — F1120 Opioid dependence, uncomplicated: Secondary | ICD-10-CM | POA: Diagnosis not present

## 2024-04-23 DIAGNOSIS — M329 Systemic lupus erythematosus, unspecified: Secondary | ICD-10-CM | POA: Insufficient documentation

## 2024-04-23 DIAGNOSIS — O99323 Drug use complicating pregnancy, third trimester: Secondary | ICD-10-CM | POA: Diagnosis not present

## 2024-04-23 DIAGNOSIS — F1191 Opioid use, unspecified, in remission: Secondary | ICD-10-CM | POA: Diagnosis not present

## 2024-04-23 NOTE — Progress Notes (Signed)
 MFM Consult Note  Felicia Davila is currently at 37 weeks and 5 days.    She had a reactive NST today.  Contractions every 2 to 5 minutes were noted on the toco.    A modified BPP performed today showed a total AFI of 13.98 cm.    The fetus was in the vertex presentation.    A normal-appearing posterior placenta was noted.  As the patient complained about contractions, a digital exam was performed today showing that her cervix was closed and 50% effaced.    As the patient lives an hour away from the hospital, she was advised to come to the hospital immediately should she feel regular contractions that are occurring every 5 minutes apart.    She will return in 1 week for another NST/modified BPP.    She already has a repeat cesarean delivery scheduled on May 02, 2024.  The patient stated that all of her questions were answered today.    A total of 20 minutes was spent counseling and coordinating the care for this patient.

## 2024-04-23 NOTE — Procedures (Addendum)
 Felicia Davila February 22, 1991 [redacted]w[redacted]d  Fetus A Non-Stress Test Interpretation for 04/23/2024  Indication: Lupus, OUD-Subutex   Fetal Heart Rate A Mode: External Baseline Rate (A): 130 bpm Variability: Moderate Accelerations: 15 x 15 Decelerations: None Multiple birth?: No  Uterine Activity Mode: Palpation, Toco Contraction Frequency (min): 2-5 Contraction Duration (sec): 50-70 Contraction Quality: Mild Resting Tone Palpated: Relaxed Resting Time: Adequate  Interpretation (Fetal Testing) Nonstress Test Interpretation: Reactive Overall Impression: Reassuring for gestational age Comments: Dr. Ileana reviewed tracing

## 2024-04-23 NOTE — Addendum Note (Signed)
 Addended by: ILEANA BABARA RUSHIE STEFFAN on: 04/23/2024 04:26 PM   Modules accepted: Level of Service

## 2024-04-24 ENCOUNTER — Encounter (HOSPITAL_COMMUNITY): Payer: Self-pay

## 2024-04-24 ENCOUNTER — Telehealth (HOSPITAL_COMMUNITY): Payer: Self-pay | Admitting: *Deleted

## 2024-04-24 ENCOUNTER — Telehealth (HOSPITAL_COMMUNITY): Payer: Self-pay | Admitting: Pharmacy Technician

## 2024-04-24 ENCOUNTER — Encounter: Admitting: Obstetrics and Gynecology

## 2024-04-24 ENCOUNTER — Other Ambulatory Visit: Payer: Self-pay | Admitting: Obstetrics and Gynecology

## 2024-04-24 ENCOUNTER — Other Ambulatory Visit (HOSPITAL_COMMUNITY): Payer: Self-pay | Admitting: Obstetrics and Gynecology

## 2024-04-24 NOTE — Telephone Encounter (Signed)
 Preadmission screen

## 2024-04-24 NOTE — Progress Notes (Signed)
 Last 2 doses of venofer  ordered

## 2024-04-24 NOTE — Telephone Encounter (Signed)
 Auth Submission: NO AUTH NEEDED Site of care: [facility] Payer: [employer] Medication & CPT/J Code(s) submitted: Venofer  (Iron  Sucrose) J1756 Diagnosis Code: D50.8 Route of submission (phone, fax, portal):  Phone # Fax # Auth type: Buy/Bill HB Units/visits requested: 200MG  X 2 DOSES Reference number:  Approval from: 04/24/2024 to 05/27/24    Dagoberto Armour, CPhT [facility] Phone: [phone] 04/24/2024

## 2024-04-25 ENCOUNTER — Telehealth (HOSPITAL_COMMUNITY): Payer: Self-pay | Admitting: *Deleted

## 2024-04-25 NOTE — Telephone Encounter (Signed)
 Preadmission screen

## 2024-04-26 ENCOUNTER — Other Ambulatory Visit (HOSPITAL_COMMUNITY): Payer: Self-pay

## 2024-04-26 ENCOUNTER — Encounter: Payer: Self-pay | Admitting: Family Medicine

## 2024-04-27 ENCOUNTER — Other Ambulatory Visit: Payer: Self-pay

## 2024-04-27 ENCOUNTER — Encounter (HOSPITAL_COMMUNITY): Payer: Self-pay | Admitting: Obstetrics and Gynecology

## 2024-04-27 ENCOUNTER — Encounter (HOSPITAL_COMMUNITY): Admission: AD | Disposition: A | Payer: Self-pay | Source: Home / Self Care | Attending: Obstetrics and Gynecology

## 2024-04-27 ENCOUNTER — Other Ambulatory Visit: Payer: Self-pay | Admitting: Obstetrics and Gynecology

## 2024-04-27 ENCOUNTER — Inpatient Hospital Stay (HOSPITAL_COMMUNITY): Admitting: Anesthesiology

## 2024-04-27 ENCOUNTER — Encounter: Payer: Self-pay | Admitting: Family Medicine

## 2024-04-27 ENCOUNTER — Encounter (HOSPITAL_COMMUNITY): Payer: Self-pay

## 2024-04-27 ENCOUNTER — Inpatient Hospital Stay (HOSPITAL_COMMUNITY)
Admission: AD | Admit: 2024-04-27 | Discharge: 2024-04-30 | DRG: 788 | Disposition: A | Discharge status: Home / Self Care | Attending: Family Medicine | Admitting: Family Medicine

## 2024-04-27 ENCOUNTER — Telehealth (HOSPITAL_COMMUNITY): Payer: Self-pay | Admitting: *Deleted

## 2024-04-27 ENCOUNTER — Encounter: Payer: Self-pay | Admitting: Obstetrics and Gynecology

## 2024-04-27 DIAGNOSIS — Z87891 Personal history of nicotine dependence: Secondary | ICD-10-CM

## 2024-04-27 DIAGNOSIS — F419 Anxiety disorder, unspecified: Secondary | ICD-10-CM | POA: Diagnosis present

## 2024-04-27 DIAGNOSIS — M329 Systemic lupus erythematosus, unspecified: Secondary | ICD-10-CM | POA: Diagnosis present

## 2024-04-27 DIAGNOSIS — Z3A38 38 weeks gestation of pregnancy: Secondary | ICD-10-CM

## 2024-04-27 DIAGNOSIS — O99892 Other specified diseases and conditions complicating childbirth: Secondary | ICD-10-CM | POA: Diagnosis present

## 2024-04-27 DIAGNOSIS — R8271 Bacteriuria: Secondary | ICD-10-CM | POA: Diagnosis present

## 2024-04-27 DIAGNOSIS — Z885 Allergy status to narcotic agent status: Secondary | ICD-10-CM

## 2024-04-27 DIAGNOSIS — O34211 Maternal care for low transverse scar from previous cesarean delivery: Principal | ICD-10-CM | POA: Diagnosis present

## 2024-04-27 DIAGNOSIS — Z98891 History of uterine scar from previous surgery: Principal | ICD-10-CM

## 2024-04-27 DIAGNOSIS — O99824 Streptococcus B carrier state complicating childbirth: Secondary | ICD-10-CM | POA: Diagnosis present

## 2024-04-27 DIAGNOSIS — Z8269 Family history of other diseases of the musculoskeletal system and connective tissue: Secondary | ICD-10-CM | POA: Diagnosis not present

## 2024-04-27 DIAGNOSIS — O99820 Streptococcus B carrier state complicating pregnancy: Secondary | ICD-10-CM | POA: Diagnosis not present

## 2024-04-27 DIAGNOSIS — O99344 Other mental disorders complicating childbirth: Secondary | ICD-10-CM | POA: Diagnosis present

## 2024-04-27 DIAGNOSIS — O9902 Anemia complicating childbirth: Secondary | ICD-10-CM | POA: Diagnosis present

## 2024-04-27 DIAGNOSIS — D6862 Lupus anticoagulant syndrome: Secondary | ICD-10-CM

## 2024-04-27 LAB — COMPREHENSIVE METABOLIC PANEL WITH GFR
ALT: 9 U/L (ref 0–44)
AST: 23 U/L (ref 15–41)
Albumin: 3.6 g/dL (ref 3.5–5.0)
Alkaline Phosphatase: 173 U/L — ABNORMAL HIGH (ref 38–126)
Anion gap: 12 (ref 5–15)
BUN: 8 mg/dL (ref 6–20)
CO2: 22 mmol/L (ref 22–32)
Calcium: 9.1 mg/dL (ref 8.9–10.3)
Chloride: 101 mmol/L (ref 98–111)
Creatinine, Ser: 0.6 mg/dL (ref 0.44–1.00)
GFR, Estimated: >60 mL/min
Glucose, Bld: 66 mg/dL — ABNORMAL LOW (ref 70–99)
Potassium: 4.1 mmol/L (ref 3.5–5.1)
Sodium: 134 mmol/L — ABNORMAL LOW (ref 135–145)
Total Bilirubin: 0.3 mg/dL (ref 0.0–1.2)
Total Protein: 7.1 g/dL (ref 6.5–8.1)

## 2024-04-27 LAB — TYPE AND SCREEN
ABO/RH(D): A POS
Antibody Screen: NEGATIVE

## 2024-04-27 LAB — CBC
HCT: 35.8 % — ABNORMAL LOW (ref 36.0–46.0)
Hemoglobin: 11.8 g/dL — ABNORMAL LOW (ref 12.0–15.0)
MCH: 28.4 pg (ref 26.0–34.0)
MCHC: 33 g/dL (ref 30.0–36.0)
MCV: 86.3 fL (ref 80.0–100.0)
Platelets: 273 K/uL (ref 150–400)
RBC: 4.15 MIL/uL (ref 3.87–5.11)
RDW: 13.2 % (ref 11.5–15.5)
WBC: 9.5 K/uL (ref 4.0–10.5)
nRBC: 0 % (ref 0.0–0.2)

## 2024-04-27 LAB — CREATININE, SERUM
Creatinine, Ser: 0.55 mg/dL (ref 0.44–1.00)
GFR, Estimated: >60 mL/min

## 2024-04-27 MED ORDER — OXYTOCIN-SODIUM CHLORIDE 30-0.9 UT/500ML-% IV SOLN
2.5000 [IU]/h | INTRAVENOUS | Status: AC
  Administered 2024-04-28: 2.5 [IU]/h via INTRAVENOUS
  Filled 2024-04-27: qty 500

## 2024-04-27 MED ORDER — FENTANYL CITRATE (PF) 100 MCG/2ML IJ SOLN
INTRAMUSCULAR | Status: AC
  Filled 2024-04-27: qty 2

## 2024-04-27 MED ORDER — ALPRAZOLAM 0.25 MG PO TABS
1.0000 mg | ORAL_TABLET | Freq: Three times a day (TID) | ORAL | Status: DC | PRN
  Administered 2024-04-28 – 2024-04-30 (×7): 1 mg via ORAL
  Filled 2024-04-27 (×7): qty 4

## 2024-04-27 MED ORDER — TRANEXAMIC ACID-NACL 1000-0.7 MG/100ML-% IV SOLN
INTRAVENOUS | Status: DC | PRN
  Administered 2024-04-27: 1000 mg via INTRAVENOUS

## 2024-04-27 MED ORDER — BUPRENORPHINE HCL 8 MG SL SUBL
8.0000 mg | SUBLINGUAL_TABLET | Freq: Three times a day (TID) | SUBLINGUAL | Status: DC
  Filled 2024-04-27: qty 1

## 2024-04-27 MED ORDER — FLUOXETINE HCL 20 MG PO CAPS
20.0000 mg | ORAL_CAPSULE | Freq: Every day | ORAL | Status: DC
  Administered 2024-04-28 – 2024-04-30 (×3): 20 mg via ORAL
  Filled 2024-04-27 (×3): qty 1

## 2024-04-27 MED ORDER — ONDANSETRON HCL 4 MG/2ML IJ SOLN: 4.0000 mg | Freq: Three times a day (TID) | INTRAMUSCULAR | Status: DC | PRN

## 2024-04-27 MED ORDER — FENTANYL CITRATE (PF) 100 MCG/2ML IJ SOLN
INTRAMUSCULAR | Status: DC | PRN
  Administered 2024-04-27: 15 ug via INTRATHECAL

## 2024-04-27 MED ORDER — IBUPROFEN 600 MG PO TABS
600.0000 mg | ORAL_TABLET | Freq: Four times a day (QID) | ORAL | Status: DC
  Administered 2024-04-29 – 2024-04-30 (×7): 600 mg via ORAL
  Filled 2024-04-27 (×7): qty 1

## 2024-04-27 MED ORDER — ACETAMINOPHEN 10 MG/ML IV SOLN
INTRAVENOUS | Status: DC | PRN
  Administered 2024-04-27: 1000 mg via INTRAVENOUS

## 2024-04-27 MED ORDER — DEXAMETHASONE SOD PHOSPHATE PF 10 MG/ML IJ SOLN
INTRAMUSCULAR | Status: DC | PRN
  Administered 2024-04-27: 10 mg via INTRAVENOUS

## 2024-04-27 MED ORDER — DIBUCAINE (PERIANAL) 1 % EX OINT: 1.0000 | TOPICAL_OINTMENT | CUTANEOUS | Status: DC | PRN

## 2024-04-27 MED ORDER — SIMETHICONE 80 MG PO CHEW
80.0000 mg | CHEWABLE_TABLET | Freq: Three times a day (TID) | ORAL | Status: DC
  Administered 2024-04-28 – 2024-04-30 (×9): 80 mg via ORAL
  Filled 2024-04-27 (×9): qty 1

## 2024-04-27 MED ORDER — MIDAZOLAM HCL 5 MG/5ML IJ SOLN
INTRAMUSCULAR | Status: DC | PRN
  Administered 2024-04-27 (×2): 1 mg via INTRAVENOUS

## 2024-04-27 MED ORDER — BUPIVACAINE HCL (PF) 0.25 % IJ SOLN
INTRAMUSCULAR | Status: AC
  Filled 2024-04-27: qty 30

## 2024-04-27 MED ORDER — MIDAZOLAM HCL 2 MG/2ML IJ SOLN
INTRAMUSCULAR | Status: AC
  Filled 2024-04-27: qty 2

## 2024-04-27 MED ORDER — HYDROMORPHONE HCL 1 MG/ML IJ SOLN
0.2000 mg | INTRAMUSCULAR | Status: AC | PRN
  Administered 2024-04-28 – 2024-04-30 (×23): 0.6 mg via INTRAVENOUS
  Filled 2024-04-27 (×23): qty 1

## 2024-04-27 MED ORDER — GABAPENTIN 100 MG PO CAPS
200.0000 mg | ORAL_CAPSULE | Freq: Three times a day (TID) | ORAL | Status: DC
  Administered 2024-04-28 (×4): 200 mg via ORAL
  Filled 2024-04-27 (×4): qty 2

## 2024-04-27 MED ORDER — COCONUT OIL OIL: 1.0000 | TOPICAL_OIL | Status: DC | PRN

## 2024-04-27 MED ORDER — SOD CITRATE-CITRIC ACID 500-334 MG/5ML PO SOLN
30.0000 mL | ORAL | Status: AC
  Administered 2024-04-27: 30 mL via ORAL
  Filled 2024-04-27: qty 30

## 2024-04-27 MED ORDER — HYDROMORPHONE HCL 1 MG/ML IJ SOLN
INTRAMUSCULAR | Status: AC
  Filled 2024-04-27: qty 0.5

## 2024-04-27 MED ORDER — LACTATED RINGERS IV SOLN: INTRAVENOUS | Status: DC

## 2024-04-27 MED ORDER — WITCH HAZEL-GLYCERIN EX PADS: 1.0000 | MEDICATED_PAD | CUTANEOUS | Status: DC | PRN

## 2024-04-27 MED ORDER — MEPERIDINE HCL 25 MG/ML IJ SOLN: 6.2500 mg | INTRAMUSCULAR | Status: DC | PRN

## 2024-04-27 MED ORDER — OXYTOCIN-SODIUM CHLORIDE 30-0.9 UT/500ML-% IV SOLN
INTRAVENOUS | Status: DC | PRN
  Administered 2024-04-27: 30 [IU] via INTRAVENOUS

## 2024-04-27 MED ORDER — PHENYLEPHRINE HCL-NACL 20-0.9 MG/250ML-% IV SOLN
INTRAVENOUS | Status: DC | PRN
  Administered 2024-04-27: 60 ug/min via INTRAVENOUS

## 2024-04-27 MED ORDER — KETAMINE HCL 50 MG/5ML IJ SOSY
PREFILLED_SYRINGE | INTRAMUSCULAR | Status: AC
  Filled 2024-04-27: qty 5

## 2024-04-27 MED ORDER — HYDROMORPHONE HCL 1 MG/ML IJ SOLN
0.2500 mg | INTRAMUSCULAR | Status: DC | PRN
  Administered 2024-04-27 (×4): 0.5 mg via INTRAVENOUS

## 2024-04-27 MED ORDER — SENNOSIDES-DOCUSATE SODIUM 8.6-50 MG PO TABS
2.0000 | ORAL_TABLET | Freq: Every day | ORAL | Status: DC
  Administered 2024-04-28 – 2024-04-30 (×3): 2 via ORAL
  Filled 2024-04-27 (×3): qty 2

## 2024-04-27 MED ORDER — ACETAMINOPHEN 10 MG/ML IV SOLN: 1000.0000 mg | Freq: Once | INTRAVENOUS | Status: DC | PRN

## 2024-04-27 MED ORDER — ASPIRIN 81 MG PO TBEC
162.0000 mg | DELAYED_RELEASE_TABLET | Freq: Every day | ORAL | Status: DC
  Administered 2024-04-28 – 2024-04-30 (×3): 162 mg via ORAL
  Filled 2024-04-27 (×3): qty 2

## 2024-04-27 MED ORDER — SODIUM CHLORIDE 0.9% FLUSH: 3.0000 mL | INTRAVENOUS | Status: DC | PRN

## 2024-04-27 MED ORDER — OXYCODONE HCL 5 MG PO TABS
5.0000 mg | ORAL_TABLET | ORAL | Status: DC | PRN
  Administered 2024-04-27 – 2024-04-29 (×7): 10 mg via ORAL
  Filled 2024-04-27 (×7): qty 2

## 2024-04-27 MED ORDER — MORPHINE SULFATE (PF) 0.5 MG/ML IJ SOLN
INTRAMUSCULAR | Status: AC
  Filled 2024-04-27: qty 10

## 2024-04-27 MED ORDER — SCOPOLAMINE 1 MG/3DAYS TD PT72: 1.0000 | MEDICATED_PATCH | Freq: Once | TRANSDERMAL | Status: DC

## 2024-04-27 MED ORDER — DIPHENHYDRAMINE HCL 25 MG PO CAPS: 25.0000 mg | ORAL_CAPSULE | ORAL | Status: DC | PRN

## 2024-04-27 MED ORDER — FENTANYL CITRATE (PF) 100 MCG/2ML IJ SOLN
INTRAMUSCULAR | Status: DC | PRN
  Administered 2024-04-27: 25 ug via INTRAVENOUS
  Administered 2024-04-27: 60 ug via INTRAVENOUS

## 2024-04-27 MED ORDER — GLYCOPYRROLATE 0.2 MG/ML IJ SOLN
INTRAMUSCULAR | Status: DC | PRN
  Administered 2024-04-27: .2 mg via INTRAVENOUS

## 2024-04-27 MED ORDER — NALOXONE HCL 0.4 MG/ML IJ SOLN: 0.4000 mg | INTRAMUSCULAR | Status: DC | PRN

## 2024-04-27 MED ORDER — BUPIVACAINE IN DEXTROSE 0.75-8.25 % IT SOLN
INTRATHECAL | Status: DC | PRN
  Administered 2024-04-27: 1.6 mL via INTRATHECAL

## 2024-04-27 MED ORDER — PRENATAL MULTIVITAMIN CH
1.0000 | ORAL_TABLET | Freq: Every day | ORAL | Status: DC
  Filled 2024-04-27 (×3): qty 1

## 2024-04-27 MED ORDER — POVIDONE-IODINE 10 % EX SWAB: 2.0000 | Freq: Once | CUTANEOUS | Status: DC

## 2024-04-27 MED ORDER — MORPHINE SULFATE (PF) 0.5 MG/ML IJ SOLN
INTRAMUSCULAR | Status: DC | PRN
  Administered 2024-04-27: 150 ug via INTRATHECAL

## 2024-04-27 MED ORDER — KETOROLAC TROMETHAMINE 30 MG/ML IJ SOLN
30.0000 mg | Freq: Four times a day (QID) | INTRAMUSCULAR | Status: AC
  Administered 2024-04-27 – 2024-04-28 (×4): 30 mg via INTRAVENOUS
  Filled 2024-04-27 (×4): qty 1

## 2024-04-27 MED ORDER — TRANEXAMIC ACID 1000 MG/10ML IV SOLN: INTRAVENOUS | Status: DC | PRN

## 2024-04-27 MED ORDER — LACTATED RINGERS IV SOLN: INTRAVENOUS | Status: DC | PRN

## 2024-04-27 MED ORDER — TRANEXAMIC ACID-NACL 1000-0.7 MG/100ML-% IV SOLN: 1000.0000 mg | Freq: Once | INTRAVENOUS | Status: DC

## 2024-04-27 MED ORDER — MENTHOL 3 MG MT LOZG: 1.0000 | LOZENGE | OROMUCOSAL | Status: DC | PRN

## 2024-04-27 MED ORDER — NALOXONE HCL 4 MG/10ML IJ SOLN: 1.0000 ug/kg/h | INTRAVENOUS | Status: DC | PRN

## 2024-04-27 MED ORDER — DIPHENHYDRAMINE HCL 25 MG PO CAPS: 25.0000 mg | ORAL_CAPSULE | Freq: Four times a day (QID) | ORAL | Status: DC | PRN

## 2024-04-27 MED ORDER — BUPIVACAINE HCL 0.25 % IJ SOLN
INTRAMUSCULAR | Status: DC | PRN
  Administered 2024-04-27: 30 mL

## 2024-04-27 MED ORDER — DEXMEDETOMIDINE HCL IN NACL 80 MCG/20ML IV SOLN
INTRAVENOUS | Status: DC | PRN
  Administered 2024-04-27: 8 ug via INTRAVENOUS
  Administered 2024-04-27: 4 ug via INTRAVENOUS

## 2024-04-27 MED ORDER — DIPHENHYDRAMINE HCL 50 MG/ML IJ SOLN: 12.5000 mg | INTRAMUSCULAR | Status: DC | PRN

## 2024-04-27 MED ORDER — ACETAMINOPHEN 500 MG PO TABS
1000.0000 mg | ORAL_TABLET | Freq: Four times a day (QID) | ORAL | Status: DC
  Administered 2024-04-28 – 2024-04-30 (×10): 1000 mg via ORAL
  Filled 2024-04-27 (×10): qty 2

## 2024-04-27 MED ORDER — SODIUM CHLORIDE 0.9 % IV SOLN: INTRAVENOUS | Status: DC | PRN

## 2024-04-27 MED ORDER — MEASLES, MUMPS & RUBELLA VAC ~~LOC~~ SUSR: 0.5000 mL | Freq: Once | SUBCUTANEOUS | Status: DC

## 2024-04-27 MED ORDER — SCOPOLAMINE 1 MG/3DAYS TD PT72
MEDICATED_PATCH | TRANSDERMAL | Status: AC
  Filled 2024-04-27: qty 1

## 2024-04-27 MED ORDER — ONDANSETRON HCL 4 MG/2ML IJ SOLN
INTRAMUSCULAR | Status: DC | PRN
  Administered 2024-04-27: 4 mg via INTRAVENOUS

## 2024-04-27 MED ORDER — ZOLPIDEM TARTRATE 5 MG PO TABS: 5.0000 mg | ORAL_TABLET | Freq: Every evening | ORAL | Status: DC | PRN

## 2024-04-27 MED ORDER — SIMETHICONE 80 MG PO CHEW: 80.0000 mg | CHEWABLE_TABLET | ORAL | Status: DC | PRN

## 2024-04-27 MED ORDER — CEFAZOLIN SODIUM-DEXTROSE 2-4 GM/100ML-% IV SOLN
2.0000 g | INTRAVENOUS | Status: AC
  Administered 2024-04-27: 2 g via INTRAVENOUS
  Filled 2024-04-27: qty 100

## 2024-04-27 MED ORDER — ENOXAPARIN SODIUM 40 MG/0.4ML IJ SOSY
40.0000 mg | PREFILLED_SYRINGE | INTRAMUSCULAR | Status: DC
  Filled 2024-04-27: qty 0.4

## 2024-04-27 MED ORDER — DIPHENHYDRAMINE HCL 50 MG/ML IJ SOLN
INTRAMUSCULAR | Status: DC | PRN
  Administered 2024-04-27: 12.5 mg via INTRAVENOUS

## 2024-04-27 NOTE — Transfer of Care (Signed)
 Immediate Anesthesia Transfer of Care Note  Patient: Felicia Davila  Procedure(s) Performed: CESAREAN DELIVERY  Patient Location: PACU  Anesthesia Type:Spinal  Level of Consciousness: awake, oriented, drowsy, and patient cooperative  Airway & Oxygen Therapy: Patient Spontanous Breathing  Post-op Assessment: Report given to RN and Post -op Vital signs reviewed and stable  Post vital signs: Reviewed and stable  Last Vitals:  Vitals Value Taken Time  BP 105/46 04/27/24 21:21  Temp    Pulse 53 04/27/24 21:25  Resp 12 04/27/24 21:25  SpO2 94 % 04/27/24 21:25  Vitals shown include unfiled device data.  Last Pain:  Vitals:   04/27/24 1925  TempSrc: Oral  PainSc: 9          Complications: No notable events documented.

## 2024-04-27 NOTE — Op Note (Signed)
 Preoperative Diagnosis:  IUP @ [redacted]w[redacted]d, Previous C-section x 2  Postoperative Diagnosis:  Same  Procedure: repeat low transverse cesarean section  Surgeon: Glenys Birk, M.D.  Assistant: Magali Bishop MD An experienced assistant was required given the standard of surgical care given the complexity of the case.  This assistant was needed for exposure, dissection, suctioning, retraction, instrument exchange, assisting with delivery with administration of fundal pressure, and for overall help during the procedure.  Findings: Viable female infant, APGAR (1 MIN): 9  APGAR (5 MINS): 9  Weight pending, vertex presentation, ROA position  Estimated blood loss: 162 cc  Complications: None known  Specimens: Placenta to labor and delivery  Reason for procedure: Briefly, the patient is a 34 y.o. H1E8847 [redacted]w[redacted]d who presents for painful contractions and 2 prior C-sections.  Procedure: Patient is a to the OR where spinal analgesia was administered. She was then placed in a supine position with left lateral tilt. She received 2 g of Ancef and SCDs were in place. A timeout was performed. She was prepped and draped in the usual sterile fashion. A Foley catheter was placed in the bladder. A knife was then used to make a Pfannenstiel incision. This incision was carried out to underlying fascia which was divided in the midline with the knife. The incision was extended laterally, sharply.  The rectus was divided in the midline.  The peritoneal cavity was entered bluntly.  Alexis retractor was placed inside the incision.  A knife was used to make a low transverse incision on the uterus. This incision was carried down to the amniotic cavity which was entered bluntly. Fetus was in ROA position and was brought up out of the incision without difficulty. Cord was clamped x 2 and cut. Infant taken to waiting nurse.  Cord blood was obtained. Placenta was delivered from the uterus.  Uterus was cleaned with dry lap pads.  Uterine incision closed with 0 Monocryl suture in a running fashion. Alexis retractor was removed from the abdomen. Peritoneal closure was done with 0 Monocryl suture. Right rectus bleeding stopped with figure of eight x 1. Fascia is closed with 0 Vicryl suture in a running fashion. Subcutaneous tissue infused with 30cc 0.25% Marcaine.  Subcutaneous closure was performed with 0 plain suture.  Skin closed using 3-0 Vicryl on a Keith needle.  Honeycomb applied, followed by pressure dressing.  All instrument, needle and lap counts were correct x 2.  Patient was awake and taken to PACU stable.  Infant remained with mom in couplet care, stable.   Glenys RAMAN PrattMD 04/27/2024 9:31 PM

## 2024-04-27 NOTE — Anesthesia Postprocedure Evaluation (Signed)
"   Anesthesia Post Note  Patient: Felicia Davila  Procedure(s) Performed: CESAREAN DELIVERY     Patient location during evaluation: Mother Baby Anesthesia Type: Spinal Level of consciousness: oriented and awake and alert Pain management: pain level controlled Vital Signs Assessment: post-procedure vital signs reviewed and stable Respiratory status: spontaneous breathing and respiratory function stable Cardiovascular status: blood pressure returned to baseline and stable Postop Assessment: no headache, no backache, no apparent nausea or vomiting and able to ambulate Anesthetic complications: no   No notable events documented.  Last Vitals:  Vitals:   04/27/24 2205 04/27/24 2215  BP:  (!) 95/44  Pulse: (!) 51 (!) 44  Resp: 20 10  Temp:    SpO2: 93% 95%    Last Pain:  Vitals:   04/27/24 2222  TempSrc:   PainSc: 8                  [name] P Octavia Velador      "

## 2024-04-27 NOTE — Anesthesia Procedure Notes (Signed)
 Spinal  Patient location during procedure: OR Start time: 04/27/2024 8:10 PM End time: 04/27/2024 8:14 PM  Staffing Performed: anesthesiologist  Authorized by: Dorethea Cordella SQUIBB, DO   Performed by: Dorethea Cordella SQUIBB, DO  Preanesthetic Checklist Completed: patient identified, IV checked, site marked, risks and benefits discussed, surgical consent, monitors and equipment checked, pre-op evaluation and timeout performed Spinal Block Patient position: sitting Prep: DuraPrep Patient monitoring: heart rate, cardiac monitor, continuous pulse ox and blood pressure Approach: midline Location: L3-4 Injection technique: single-shot Needle Needle type: Pencan  Needle gauge: 24 G Needle length: 10 cm Assessment Events: CSF return  Additional Notes Patient identified. Risks/Benefits/Options discussed with patient including but not limited to bleeding, infection, nerve damage, paralysis, failed block, incomplete pain control, headache, blood pressure changes, nausea, vomiting, reactions to medications, itching and postpartum back pain. Confirmed with bedside nurse the patient's most recent platelet count. Confirmed with patient that they are not currently taking any anticoagulation, have any bleeding history or any family history of bleeding disorders. Patient expressed understanding and wished to proceed. All questions were answered. Sterile technique was used throughout the entire procedure. Please see nursing notes for vital signs. Warning signs of high block given to the patient including shortness of breath, tingling/numbness in hands, complete motor block, or any concerning symptoms with instructions to call for help. Patient was given instructions on fall risk and not to get out of bed. All questions and concerns addressed with instructions to call with any issues or inadequate analgesia.

## 2024-04-27 NOTE — Anesthesia Preprocedure Evaluation (Signed)
 "                                  Anesthesia Evaluation  Patient identified by MRN, date of birth, ID band Patient awake    Reviewed: Allergy & Precautions, NPO status , Patient's Chart, lab work & pertinent test results  History of Anesthesia Complications (+) PONV and history of anesthetic complications  Airway Mallampati: II  TM Distance: >3 FB Neck ROM: Full    Dental no notable dental hx.    Pulmonary neg pulmonary ROS, Patient abstained from smoking., former smoker   Pulmonary exam normal        Cardiovascular negative cardio ROS  Rhythm:Regular Rate:Normal     Neuro/Psych   Anxiety Depression    negative neurological ROS     GI/Hepatic negative GI ROS, Neg liver ROS,,,  Endo/Other  negative endocrine ROS    Renal/GU negative Renal ROS  negative genitourinary   Musculoskeletal negative musculoskeletal ROS (+)    Abdominal Normal abdominal exam  (+)   Peds  Hematology  (+) Blood dyscrasia, anemia Lab Results      Component                Value               Date                      WBC                      6.6                 04/12/2024                HGB                      10.6 (L)            04/12/2024                HCT                      32.9 (L)            04/12/2024                MCV                      88                  04/12/2024                PLT                      226                 04/12/2024             Lab Results      Component                Value               Date                      NA  134 (L)             01/03/2024                K                        3.8                 01/03/2024                CO2                      22                  01/03/2024                GLUCOSE                  89                  01/03/2024                BUN                      <5 (L)              01/03/2024                CREATININE               0.52                01/03/2024                 CALCIUM                  8.7 (L)             01/03/2024                EGFR                     123                 11/10/2023                GFRNONAA                 >60                 01/03/2024              Anesthesia Other Findings   Reproductive/Obstetrics (+) Pregnancy                              Anesthesia Physical Anesthesia Plan  ASA: 2  Anesthesia Plan: Spinal   Post-op Pain Management:    Induction:   PONV Risk Score and Plan: 3 and Ondansetron , Dexamethasone  and Treatment may vary due to age or medical condition  Airway Management Planned: Natural Airway  Additional Equipment: None  Intra-op Plan:   Post-operative Plan:   Informed Consent: I have reviewed the patients History and Physical, chart, labs and discussed the procedure including the risks, benefits and alternatives for the proposed anesthesia with the patient or authorized representative who has indicated his/her understanding and acceptance.     Dental advisory given  Plan Discussed with: CRNA  Anesthesia Plan Comments:  Anesthesia Quick Evaluation  "

## 2024-04-27 NOTE — Telephone Encounter (Signed)
 Preadmission screen
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
## 2024-04-27 NOTE — Discharge Summary (Signed)
 "  Postpartum Discharge Summary     Patient Name: Felicia Davila DOB: 03-31-1991 MRN: [id number]  Date of admission: 04/27/2024 Delivery date:04/27/2024 Delivering provider: FREDIRICK GLENYS RAMAN Date of discharge: 04/30/2024  Admitting diagnosis: History of cesarean delivery [Z98.891] Intrauterine pregnancy: [redacted]w[redacted]d     Secondary diagnosis:  Principal Problem:   History of cesarean delivery Active Problems:   Group B streptococcal bacteriuria  Additional problems: latent labor    Discharge diagnosis: Term Pregnancy Delivered                                              Post partum procedures:None Augmentation: N/A Complications: None  Hospital course: Onset of Labor With Unplanned C/S   34 y.o. yo [id number] at [redacted]w[redacted]d was admitted in Latent Labor on 04/27/2024. Patient had a labor course significant for none. The patient went for cesarean section due to Elective Repeat. Delivery details as follows: Membrane Rupture Time/Date:  ,   Delivery Method:C-Section, Low Transverse Operative Delivery:N/A Details of operation can be found in separate operative note. Patient had a postpartum course complicated by post-op pain, discussed pain management plan in detail prior to discharge.  She is ambulating,tolerating a regular diet, passing flatus, and urinating well.  Patient is discharged home in stable condition 04/30/2024.  Newborn Data: Birth date:04/27/2024 Birth time:8:36 PM Gender:Female Living status:Living Apgars:9 ,9  Weight:3140 g  Magnesium Sulfate received: No BMZ received: No Rhophylac:N/A MMR:N/A T-DaP:declined Flu: N/A RSV Vaccine received: No Transfusion:No  Immunizations received: Immunization History  Administered Date(s) Administered   DTP 10/23/1991, 02/11/1992, 04/29/1992, 08/04/1994   DTaP / HiB 11/26/1992   HIB, Unspecified 11/26/1992   Hep B, Unspecified 10/23/1991, 02/11/1992, 08/04/1994   Hepatitis B 10/23/1991, 02/11/1992, 08/04/1994   MMR 11/26/1992, 04/09/2008   OPV  10/23/1991, 02/11/1992, 04/29/1992, 08/04/1994   Tdap 04/09/2008   Varicella 07/01/2008    Physical exam  Vitals:   04/29/24 0617 04/29/24 1618 04/29/24 2055 04/30/24 0616  BP: (!) 94/52 106/74 105/66 100/67  Pulse: (!) 53 (!) 58 62 60  Resp: 20 20 18 16   Temp: 97.6 F (36.4 C) 97.9 F (36.6 C) 97.6 F (36.4 C)   TempSrc: Oral Oral Oral   SpO2: 96% 99% 99% 98%  Weight:      Height:       General: alert, cooperative, and no distress Lochia: appropriate Uterine Fundus: firm Incision: Healing well with no significant drainage DVT Evaluation: No evidence of DVT seen on physical exam. Labs: Lab Results  Component Value Date   WBC 12.9 (H) 04/28/2024   HGB 10.6 (L) 04/28/2024   HCT 32.1 (L) 04/28/2024   MCV 87.0 04/28/2024   PLT 198 04/28/2024      Latest Ref Rng & Units 04/27/2024    6:31 PM  CMP  Glucose 70 - 99 mg/dL 66   BUN 6 - 20 mg/dL 8   Creatinine 9.55 - 8.99 mg/dL 9.55 - 8.99 mg/dL 9.39    9.44   Sodium 864 - 145 mmol/L 134   Potassium 3.5 - 5.1 mmol/L 4.1   Chloride 98 - 111 mmol/L 101   CO2 22 - 32 mmol/L 22   Calcium 8.9 - 10.3 mg/dL 9.1   Total Protein 6.5 - 8.1 g/dL 7.1   Total Bilirubin 0.0 - 1.2 mg/dL 0.3   Alkaline Phos 38 - 126 U/L 173   AST 15 -  41 U/L 23   ALT 0 - 44 U/L 9    Edinburgh Score:    04/29/2024    8:31 AM  Edinburgh Postnatal Depression Scale Screening Tool  I have been able to laugh and see the funny side of things. 1  I have looked forward with enjoyment to things. 2  I have blamed myself unnecessarily when things went wrong. 1  I have been anxious or worried for no good reason. 3  I have felt scared or panicky for no good reason. 3  Things have been getting on top of me. 1  I have been so unhappy that I have had difficulty sleeping. 1  I have felt sad or miserable. 2  I have been so unhappy that I have been crying. 1  The thought of harming myself has occurred to me. 0  Edinburgh Postnatal Depression Scale Total 15    Edinburgh Postnatal Depression Scale Total: (!) 15   After visit meds:  Allergies as of 04/30/2024       Reactions   Morphine  Anaphylaxis, Other (See Comments)   Morphine  And Codeine Anaphylaxis, Rash, Other (See Comments)      Nitrofurantoin Nausea And Vomiting, Other (See Comments)   Also, felt weird in the head   Fentanyl  Other (See Comments)   Makes Pt hot and tingly   Hydrocodone  Other (See Comments)   Pins and needle feeling in extremities   Meloxicam     N/v   Naproxen     N/v   Chlorhexidine  Itching, Rash        Medication List     STOP taking these medications    aspirin  EC 81 MG tablet   Misc. Devices Misc   predniSONE  5 MG tablet Commonly known as: DELTASONE        TAKE these medications    acetaminophen  500 MG tablet Commonly known as: TYLENOL  Take 2 tablets (1,000 mg total) by mouth every 6 (six) hours.   ALPRAZolam  1 MG tablet Commonly known as: XANAX  Take 1 tablet (1 mg total) by mouth 3 (three) times daily as needed.   buprenorphine  8 MG Subl SL tablet Commonly known as: SUBUTEX  Place 1 tablet (8 mg total) under the tongue 3 (three) times daily.   coconut oil Oil Apply 1 Application topically as needed.   dibucaine 1 % Oint Commonly known as: NUPERCAINAL Place 1 Application rectally as needed for hemorrhoids.   FLUoxetine  20 MG capsule Commonly known as: PROZAC  Take 1 capsule (20 mg total) by mouth daily.   ibuprofen  600 MG tablet Commonly known as: ADVIL  Take 1 tablet (600 mg total) by mouth every 6 (six) hours.   naloxone  4 MG/0.1ML Liqd nasal spray kit Commonly known as: Narcan  Place 1 spray in nostril if poorly responding or turning blue, for oversedation or accidental overdose.   ondansetron  4 MG disintegrating tablet Commonly known as: ZOFRAN -ODT Dissolve 1 tablet (4 mg total) by mouth every 8 (eight) hours as needed for nausea or vomiting.   Oxycodone  HCl 10 MG Tabs Take 1-1.5 tablets (10-15 mg total) by mouth  every 4 (four) hours as needed for up to 7 days for moderate pain (pain score 4-6).   Vitafol Gummies 3.33-0.333-34.8 MG Chew Chew by mouth.   Vitamin D  (Ergocalciferol ) 1.25 MG (50000 UNIT) Caps capsule Commonly known as: DRISDOL  Take 1 capsule (50,000 Units total) by mouth once a week.   witch hazel-glycerin  pad Commonly known as: TUCKS Apply 1 Application topically as needed for hemorrhoids.  Discharge home in stable condition Infant Feeding: Bottle and Breast Infant Disposition:rooming in Discharge instruction: per After Visit Summary and Postpartum booklet. Activity: Advance as tolerated. Pelvic rest for 6 weeks.  Diet: routine diet Future Appointments: No future appointments.  Follow up Visit:  Follow-up Information     [facility] for [facility] Follow up in 1 week(s).   Specialty: Obstetrics and Gynecology Why: postop check Contact information: [address] [place]  [address] [phone]                 Please schedule this patient for a In person postpartum visit in 4 weeks with the following provider: MD. Additional Postpartum F/U:Postpartum Depression checkup and Incision check 1 week  High risk pregnancy complicated by: SLE Delivery mode:  C-Section, Low Transverse Anticipated Birth Control:  Unsure   04/30/2024 Charlie DELENA Courts, MD    "

## 2024-04-27 NOTE — MAU Note (Signed)
 Felicia Davila is a 34 y.o. at [redacted]w[redacted]d here in MAU reporting: contraction started 0900 and possible LOF, wearing pad- clear or white. Seeing some spots. She reports light FMs, Denies LOF, VB, blurry vision, headaches, peripheral edema, or RUQ pain. NSTs done with MFM  LMP:  Onset of complaint: 0900 Pain score: 8/10 Vitals:   04/27/24 1543  BP: 120/75  Pulse: 91  Resp: 18  Temp: 98.4 F (36.9 C)  SpO2: 99%     FHT:  142 Lab orders placed from triage:   1 cm on monday

## 2024-04-27 NOTE — MAU Note (Signed)
 Pt states that she has been contracting since 0300 and the contractions have gotten worse over the day. Pt denies vag bleeding. Pain 8/10. FHR monitors placed.FHR 135. VEVA LITTIE POD, RN

## 2024-04-28 ENCOUNTER — Encounter: Payer: Self-pay | Admitting: Family Medicine

## 2024-04-28 ENCOUNTER — Encounter (HOSPITAL_COMMUNITY): Payer: Self-pay | Admitting: Family Medicine

## 2024-04-28 LAB — CBC
HCT: 32.1 % — ABNORMAL LOW (ref 36.0–46.0)
Hemoglobin: 10.6 g/dL — ABNORMAL LOW (ref 12.0–15.0)
MCH: 28.7 pg (ref 26.0–34.0)
MCHC: 33 g/dL (ref 30.0–36.0)
MCV: 87 fL (ref 80.0–100.0)
Platelets: 198 K/uL (ref 150–400)
RBC: 3.69 MIL/uL — ABNORMAL LOW (ref 3.87–5.11)
RDW: 13.1 % (ref 11.5–15.5)
WBC: 12.9 K/uL — ABNORMAL HIGH (ref 4.0–10.5)
nRBC: 0 % (ref 0.0–0.2)

## 2024-04-28 LAB — SYPHILIS: RPR W/REFLEX TO RPR TITER AND TREPONEMAL ANTIBODIES, TRADITIONAL SCREENING AND DIAGNOSIS ALGORITHM: RPR Ser Ql: NONREACTIVE

## 2024-04-28 MED ORDER — ALPRAZOLAM 0.25 MG PO TABS
1.0000 mg | ORAL_TABLET | ORAL | Status: AC
  Administered 2024-04-28: 1 mg via ORAL
  Filled 2024-04-28: qty 4

## 2024-04-28 NOTE — Progress Notes (Signed)
 Patient declined her lovenox  injection for now. This RN educated patient on reason for medication. Notified Luke Gentry, CNM. Tammy, Steva [place]

## 2024-04-28 NOTE — Lactation Note (Signed)
 This note was copied from a baby's chart. Lactation Consultation Note  Patient Name: Felicia Davila Unijb'd Date: 04/28/2024 Age:33 hours Reason for consult: Initial assessment;Early term 37-38.6wks  P3, Father states it is his first baby. Baby skin to skin after bath and cueing.  Mother very sleepy. Assisted with latching baby in cradle hold for 5 min. Mother complaining of biting.  Flanged bottom lip. Mother states she plans to breastfeed, pump and supplement with breastmilk and formula. She expressed interest in ending feeding due to discomfort. Baby consumed 5 ml of breastmilk after latching with Nfant white nipple. Left baby skin to skin on mother's chest but due to mother being sleepy, advised father to watch baby for safety.  Reviewed milk storage and cleaning with father. Recommend mother pump q 3 hours for 15 min. Discussed volume expectations with pumping.  Maternal Data Has patient been taught Hand Expression?: Yes Does the patient have breastfeeding experience prior to this delivery?: Yes  Feeding Mother's Current Feeding Choice: Breast Milk and Formula Nipple Type: Nfant Standard Flow (white)  LATCH Score Latch: Repeated attempts needed to sustain latch, nipple held in mouth throughout feeding, stimulation needed to elicit sucking reflex.  Audible Swallowing: A few with stimulation  Type of Nipple: Everted at rest and after stimulation  Comfort (Breast/Nipple): Soft / non-tender  Hold (Positioning): Assistance needed to correctly position infant at breast and maintain latch.  LATCH Score: 7   Lactation Tools Discussed/Used Tools: Pump;Flanges Flange Size: 24 Breast pump type: Other (comment) (Spectra  personal) Pump Education: Milk Storage (Cleaning, recommend using gray bin on counter versus bathroom sink) Reason for Pumping: mother's choice Pumping frequency: q 3 hours for 15 min Pumped volume: 12 mL  Interventions Interventions: Breast feeding  basics reviewed;Assisted with latch;Skin to skin;Hand express;DEBP;Education;LC Services brochure;CDC milk storage guidelines  Discharge Pump: Personal;DEBP (Spectra )  Consult Status Consult Status: Follow-up Date: 04/29/24 Follow-up type: In-patient    Felicia Davila [facility] 04/28/2024, 12:23 PM

## 2024-04-28 NOTE — Progress Notes (Signed)
 Called 1st call L&D ([place] CNM) because patient continues to rate pain 8-9 with pain radiating around her back.  Patient is also very anxious and requesting more Xanex but as ordered now, I told her she cannot get another dose until 8 hours from the first dose.  I have given patient as many medications as possible (on time).  Patient requests to be woken up for meds if she is asleep when they are due.   This am, patient did remove her L IV catheter (she stated it was painful.  Site was not bleeding, offered bandaid, she declined).  Did state to patient to call out for help and not to take any at home meds she might have with her in the room.   Around 5am, another nurse went in the room as I was admitting a new patient, the patient turned off her Pitocin  stating it was making her cramp horribly.  (She had already asked about it and I had explained the purpose).  The nurse that went in for me, did tell her not to turn off meds to call out, and checked her fundus and bleeding, both were fine.

## 2024-04-28 NOTE — Progress Notes (Signed)
 POSTPARTUM PROGRESS NOTE  Subjective: Felicia Davila is a 34 y.o. H1E8847 s/p RLTCS at [redacted]w[redacted]d.  She reports she doing well. No acute events overnight. She denies any problems with ambulating, voiding or po intake. Denies nausea or vomiting. She has not passed flatus. Pain is poorly controlled.  Lochia is appropriate.  Objective: Blood pressure 100/69, pulse (!) 43, temperature 98.3 F (36.8 C), temperature source Oral, resp. rate 18, height 5' 7 (1.702 m), weight 91.2 kg, last menstrual period 07/29/2023, SpO2 98%, unknown if currently breastfeeding.  Physical Exam:  General: alert, cooperative and no distress Chest: no respiratory distress Abdomen: soft, non-tender  Uterine Fundus: firm, appropriately tender Extremities: No calf swelling or tenderness  trace edema  Recent Labs    04/27/24 1831  HGB 11.8*  HCT 35.8*    Assessment/Plan: Felicia Davila is a 34 y.o. H1E8847 s/p RLTCS at [redacted]w[redacted]d  Routine Postpartum Care: Doing well, poorly controlled. History of chronic pain, please see Dr. Merlynn excellent notes. -- One time dose of Xanax  now for anxiety, patient has not slept all night. -- DC pitocin , with low threshold for restarting if bleeding concerning.  Patient feels better since pitocin  discontinued.  -- Consulted Dr. Fredirick, and we cannot safely give more medications than currently prescribed. -- Patient declines ordered buprenorphine , states Dr. Lola told her not to take s/p surgery. Per my reading of note, suggests otherwise.  -- Continue routine care, lactation support  -- Contraception: BTL -- Feeding: breast  Dispo: Plan for discharge 04/29/24.  Camie Rote, MSN, CNM, RNC-OB Certified Nurse Midwife, [facility] 04/28/2024 4:59 AM

## 2024-04-28 NOTE — Progress Notes (Signed)
 Patient requested and abdominal binder. Called Luke Gentry, CNM, who ordered binder for patient. Tammy, Steva [place]

## 2024-04-29 ENCOUNTER — Encounter: Payer: Self-pay | Admitting: Obstetrics and Gynecology

## 2024-04-29 ENCOUNTER — Other Ambulatory Visit (HOSPITAL_COMMUNITY): Payer: Self-pay

## 2024-04-29 MED ORDER — GABAPENTIN 300 MG PO CAPS
300.0000 mg | ORAL_CAPSULE | Freq: Three times a day (TID) | ORAL | Status: DC
  Administered 2024-04-29 – 2024-04-30 (×4): 300 mg via ORAL
  Filled 2024-04-29 (×4): qty 1

## 2024-04-29 MED ORDER — OXYCODONE HCL 5 MG PO TABS
10.0000 mg | ORAL_TABLET | ORAL | Status: DC | PRN
  Administered 2024-04-29 – 2024-04-30 (×6): 15 mg via ORAL
  Filled 2024-04-29 (×6): qty 3

## 2024-04-29 NOTE — Lactation Note (Addendum)
 This note was copied from a baby's chart. 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 Mother recently pumped 10 ml. Clarified (verbally and wrote on board in room) pumping frequency of q 3 hours for 15 min, both breasts at the same time for efficiency Mother is using her personal Spectra  pump with 24 mm flanges which she states are comfortable.  Mother states her baby has not reacted well to the formula. Offered choice of donor milk with agreement to pump q 3 hours.   Mother happy with choice and signed Donor Milk acknowledgment.  Father gave baby 10 ml of bresatmilk with Nfant White standard nipple and an additional 8 ml of donor milk.  Demonstrated how to feed in side lying position  Reviewed volume guidelines, milk storage of breastmilk or donor milk. Answered all questions.  Suggest calling for help as needed.  Returned to room to give family 100 ml of donor milk. Reminded mother to pump and give the difference with donor milk.  Baby recently consumed formula.  Baby actively sucking on pacifier when LC entered the room. Reviewed donor milk storage and provided smaller bottles.  Maternal Data Has patient been taught Hand Expression?: Yes Does the patient have breastfeeding experience prior to this delivery?: Yes  Feeding Mother's Current Feeding Choice: Breast Milk and Donor Milk Nipple Type: Nfant Standard Flow (white) Lactation Tools Discussed/Used Tools: Pump;Flanges Flange Size: 24 Breast pump type: Double-Electric Breast Pump (Spectra  personal) Pump Education: Milk Storage Pumping frequency: q 3 hour for 15-20 min Pumped volume: 10 mL  Interventions Interventions: DEBP;Education  Discharge Pump: Personal;DEBP  Consult Status Consult Status: Follow-up Date: 04/30/24 Follow-up  type: In-patient   Shannon Levorn Lemme  RN, IBCLC 04/29/2024, 2:05 PM

## 2024-04-29 NOTE — Progress Notes (Signed)
 Notified [place]  Claudene, CNM and Dr. Erik that patient would like to see one of them today to discuss medications and her care. Patient still receiving IV dilaudid  every 2 hours for pain and is declining subutex . Spoke with [place]  and Dr. Erik about pain medications. Dr. Erik increased gabapentin  and oxycodone --states that for now, it is fine to still give increased doses with the full 0.6 mg dose of IV dilaudid  as patient has been taking it. Patient also states that she takes her xanax  every 4-6 hours as needed; however, it is only three times a day prn now. Notified MD. Dr. Erik states that it is ok to give an extra dose today if needed. For now patient is ok and second dose given around 1100. Planned with patient to give another dose around dinner time, in which patient is in agreement with. Dr. Erik states that someone will come to speak with patient when able to. Tammy, Steva [place]

## 2024-04-29 NOTE — Progress Notes (Signed)
 Late Entry d/t pt acuity on labor:   Post Partum Day 2 Subjective: up ad lib, voiding, and tolerating PO. CNM was requested at patient bedside by pt request. Patient wanted to discuss her plan for discharge and her medication for managing her pain. She denies good pain control since her C/S. Requesting to continue her pain control with Toradol  versus ibuprofen . Patient also is concerned for not having her Prozac . Continues to refuse Subutex .    Objective: Blood pressure (!) 94/52, pulse (!) 53, temperature 97.6 F (36.4 C), temperature source Oral, resp. rate 20, height 5' 7 (1.702 m), weight 91.2 kg, last menstrual period 07/29/2023, SpO2 96%, unknown if currently breastfeeding.  Physical Exam:  General: alert, cooperative, appears stated age, and no distress Lochia: appropriate Uterine Fundus: firm Incision: Incision covered by a pressure dressing. Pressure dressing clean dry and intact. Patient initially requesting to have pressure dressing removed, then would prefer to have it removed with AM shower. CNM agreeable to plan of care.  DVT Evaluation: No evidence of DVT seen on physical exam.  Recent Labs    04/27/24 1831 04/28/24 0546  HGB 11.8* 10.6*  HCT 35.8* 32.1*    Assessment/Plan: - Discussed with patient the anesthesia order for Toradol  and the recommendation for ibuprofen  use and patient agreeable to try the ibuprofen .  - Plan for discharge after 48 hours s/p C/S. Reviewed that it will be Monday morning for discharge given time of delivery.  - Encouraged to continue with current pain regimen.  - Patient denied spiraling. Reviewed hx and current meds patient is receiving Prozac  daily. Continue with current Prozac  orders.  - Continue with her current postpartum care    LOS: 2 days   Felicia Davila, CNM 04/29/2024, 7:01 AM   - RN notified CNM of pain improvement with PO Ibuprofen .   Felicia Davila) [place], MSN, CNM  Center for [facility]  04/29/2024 7:18 AM

## 2024-04-29 NOTE — Lactation Note (Signed)
 This note was copied from a baby's chart. Lactation Consultation Note  Patient Name: Felicia Davila Date: 04/29/2024 Age:33 hours Reason for consult: Follow-up assessment;Early term 37-38.6wks  P3. Mom had a lot of questions about pumping and how much milk should she be getting and how much milk does the baby need to be drinking.  Mom appeared very tired and sleepy. Mom concerned that her colostrum has decreased. Encouraged mom to rest, dad is holding baby. Gave mom bottle/formula feeding information sheet according to hours of age how much the baby needs to be drinking. Milk storage reviewed as well. Mom was giving baby 11/2 bottle of formula by pouring it another bottle then saving the rest. Informed mom it needs to be wasted. Mom understandably said that  is very wasteful.  LC agreed but explained reasoning. Encouraged mom to rest, hydrate, and not stress over how much she is pumping right now. It is very early after delivery. Encouraged mom to call for assistance or further questions. Maternal Data    Feeding Mother's Current Feeding Choice: Breast Milk and Formula Nipple Type: Nfant Standard Flow (white)  LATCH Score                    Lactation Tools Discussed/Used    Interventions Interventions: Education  Discharge    Consult Status Consult Status: Follow-up Date: 04/30/24 Follow-up type: In-patient    Felicia Davila 04/29/2024, 3:43 AM

## 2024-04-30 ENCOUNTER — Encounter: Payer: Self-pay | Admitting: Obstetrics and Gynecology

## 2024-04-30 ENCOUNTER — Other Ambulatory Visit (HOSPITAL_COMMUNITY): Payer: Self-pay

## 2024-04-30 ENCOUNTER — Encounter (HOSPITAL_COMMUNITY): Payer: Self-pay | Admitting: Family Medicine

## 2024-04-30 ENCOUNTER — Other Ambulatory Visit

## 2024-04-30 ENCOUNTER — Other Ambulatory Visit: Payer: Self-pay

## 2024-04-30 ENCOUNTER — Telehealth: Payer: Self-pay | Admitting: Family Medicine

## 2024-04-30 ENCOUNTER — Encounter (HOSPITAL_COMMUNITY): Payer: Self-pay | Admitting: Obstetrics and Gynecology

## 2024-04-30 ENCOUNTER — Telehealth: Payer: Self-pay | Admitting: Pediatrics

## 2024-04-30 ENCOUNTER — Encounter: Payer: Self-pay | Admitting: Family Medicine

## 2024-04-30 ENCOUNTER — Encounter (HOSPITAL_COMMUNITY)
Admission: RE | Admit: 2024-04-30 | Discharge: 2024-04-30 | Disposition: A | Discharge status: Home / Self Care | Source: Ambulatory Visit

## 2024-04-30 ENCOUNTER — Ambulatory Visit

## 2024-04-30 MED ORDER — WITCH HAZEL-GLYCERIN EX PADS: 1.0000 | MEDICATED_PAD | CUTANEOUS | Status: AC | PRN

## 2024-04-30 MED ORDER — DIBUCAINE (PERIANAL) 1 % EX OINT: 1.0000 | TOPICAL_OINTMENT | CUTANEOUS | Status: AC | PRN

## 2024-04-30 MED ORDER — COCONUT OIL OIL: 1.0000 | TOPICAL_OIL | Status: AC | PRN

## 2024-04-30 MED ORDER — ACETAMINOPHEN 500 MG PO TABS
1000.0000 mg | ORAL_TABLET | Freq: Four times a day (QID) | ORAL | 0 refills | Status: DC
  Filled 2024-04-30: qty 30, 4d supply, fill #0

## 2024-04-30 MED ORDER — IBUPROFEN 600 MG PO TABS
600.0000 mg | ORAL_TABLET | Freq: Four times a day (QID) | ORAL | 0 refills | Status: DC
  Filled 2024-04-30: qty 30, 8d supply, fill #0

## 2024-04-30 MED ORDER — GABAPENTIN 300 MG PO CAPS
300.0000 mg | ORAL_CAPSULE | Freq: Three times a day (TID) | ORAL | 0 refills | Status: DC
  Filled 2024-04-30: qty 21, 7d supply, fill #0

## 2024-04-30 MED ORDER — OXYCODONE HCL 10 MG PO TABS
10.0000 mg | ORAL_TABLET | ORAL | 0 refills | Status: DC | PRN
  Filled 2024-04-30: qty 20, 4d supply, fill #0

## 2024-04-30 NOTE — Clinical Social Work Maternal (Signed)
 " CLINICAL SOCIAL WORK MATERNAL/CHILD NOTE  Patient Details  Name: Felicia Davila MRN: [id number] Date of Birth: 04/29/1990  Date:  04/30/2024  Clinical Social Worker Initiating Note:  Felicia Davila Date/Time: Initiated:  04/30/24/1600     Child's Name:  Felicia Davila   Biological Parents:  Mother, Father Felicia Davila [birth year] Felicia Davila 10-11-1992)   Need for Interpreter:  None   Reason for Referral:  Behavioral Health Concerns, Current Substance Use/Substance Use During Pregnancy     Address:  [address] [place] [address]    Phone number:  [phone] (home)     Additional phone number:   Household Members/Support Persons (HM/SP):   Household Member/Support Person 1, Household Member/Support Person 2, Household Member/Support Person 3   HM/SP Name Relationship DOB or Age  HM/SP -1 Felicia Davila [place] 10-11-1992  HM/SP -2 Felicia Davila Daughter 04-05-2013  HM/SP -3 Felicia Davila Son 10-04-2014  HM/SP -4        HM/SP -5        HM/SP -6        HM/SP -7        HM/SP -8          Natural Supports (not living in the home):  Spouse/significant other, Immediate Family   Professional Supports: None   Employment: Unemployed   Type of Work:     Education:  Attending college   Homebound arranged:    Surveyor, Quantity Resources:  Medicaid   Other Resources:  Felicia Davila   Cultural/Religious Considerations Which May Impact Care:    Strengths:  Home prepared for child  , Merchandiser, retail, Psychotropic Medications, Understanding of illness, Ability to meet basic needs     Psychotropic Medications:  Prozac , Xanax       Pediatrician:       Pediatrician List:   Felicia Davila    Felicia Davila      Pediatrician Fax Number:    Risk Factors/Current Problems:  Substance Use  , Mental Health Concerns     Cognitive State:  Able to Concentrate  , Alert  , Linear Thinking  , Insightful  , Goal  Oriented     Mood/Affect:  Comfortable  , Calm  , Relaxed     CSW Assessment: CSW received a consult due to Felicia Davila's subutex  use during pregnancy, Edinburgh score of 15 and hx of anxiety/depression. CSW met Felicia Davila at bedside to complete a full psychosocial assessment and offer support. CSW entered the room, introduced herself and explained the reason for the visit.  Felicia Davila presented bonding/holding the infant as they lay in the bed. CSW asked Felicia Davila has she selected a pediatrician for the infant's follow up visits; Felicia Davila said  CSW collected Felicia Davila's demographic information and Felicia Davila reported Felicia Davila (04-05-2013) she currently has full custody while she shares custody of Felicia Davila (10-04-2014) with his father. Felicia Davila denied CPS involvement.   CSW acknowledged Edinburgh score of 15 and listened to Felicia Davila explore her feelings about transitioning into motherhood.  Felicia Davila denied current worries/concerns. Felicia Davila reported recently restarting her medication in the last 4 months as well as relearning how to care for a newborn baby with having older children. CSW inquired to Felicia Davila about mental health history. Felicia Davila reported being diagnosed with Anxiety/Depression at the age of 34 years old. Felicia Davila reported in the past participating in therapy; however she denied current participation in therapy. CSW asked Felicia Davila would therapy  resources be helpful during this PP period; Felicia Davila said yes. CSW provided Felicia Davila with therapy resources for her area. Felicia Davila reported currently prescribed Prozac  20mg  and Xanax  which are managed by the psychiatrist Dr. Jackee Davila. Felicia Davila reported Felicia Davila is currently preparing to retire; however his office will be assisting in establishing care with another provider. 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 Felicia Davila reported being involved in a toxic relationship with her sons father which contributed to her symptoms. Felicia Davila reported at the time she was  already prescribed her current regiment of medication; however they  attempted to change her medication for additional support. Felicia Davila reported other medications were not as supportive and they restarted her medication regiment and increased her doses for support of her PPD symptoms. CSW assessed for safety with Felicia Davila SI/HI/DV;Felicia Davila denied all.  CSW asked Felicia Davila does she receive supportive resources; Felicia Davila said yes(WIC). Felicia Davila reported she had foodstamps in the past and she will reapply upon discharge.  Felicia Davila reported having all essential items for the infant including a carseat, bassinet and crib for safe sleeping. CSW provided review of Sudden Infant Death Syndrome (SIDS) precautions.  CSW asked Felicia Davila about the Subutex  once during pregnancy. Felicia Davila reported August 2024 she broke her heel and was prescribed Oxycodone  for pain relief. Felicia Davila reported contacting her doctor and wanting to discontinue her use due to not wanting to become addicted to the medication. Felicia Davila reported she was given Buprenorphine  for pain support inplace of the oxycodone . Felicia Davila reported she has been prescribed Buprenorphine  since then and has hopes of discontinuing her use in 4 months. Felicia Davila reported Felicia Davila has been prescribing her Subutex  along with the [facility] and they will still follow her throughout her PP journey. CSW informed Felicia Davila due to Subutex  use during pregnancy; the hospital will perform a UDS and CDS on the infant. If the screenings return with positive results a report to CPS will be made; Felicia Davila was understanding. CSW explained to Felicia Davila a CPS report will be completed due to the infant's UDS testing positive for Benzodiazepines. Felicia Davila reported being prescribed Xanax  which is a Benzodiazepine throughout her pregnancy. Felicia Davila denied use of any illicit substances.   CSW will complete a CPS report due to the infant UDS testing positive for Benzodiazepines. CSW will continue to monitor the CDS and complete a updated CPS report if warranted.  CSW Plan/Description:   No Further Intervention Required/No Barriers to Discharge,  Sudden Infant Death Syndrome (SIDS) Education, Perinatal Mood and Anxiety Disorder (PMADs) Education, Hospital Drug Screen Policy Information, Other Information/Referral to Felicia Davila, CSW Will Continue to Monitor Umbilical Cord Tissue Drug Screen Results and Make Report if Ranelle Felicia MARLA Joshua, LCSW 04/30/2024, 4:03 PM  "

## 2024-04-30 NOTE — Telephone Encounter (Signed)
 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  We reviewed the extended observation period of 5 days and reasons this stay could be extended (infant with poor feeding, weight loss, NOWS symptoms).  We also reviewed Eat, Sleep and Console management.  Patient concerned that she may not be able to stay with infant around the clock when she is discharged due to home obligations.  We discussed [facility] and [address] and engineer, agricultural options that could come to the nursery and hold her baby pending availability.  Encouraged patient to discuss desires and options with pediatric staff to understand plan for infant.  Patient also expressed home pain management concerns.  Encouraged her to review her discharge instructions and relay any ongoing concerns to her OB team.

## 2024-04-30 NOTE — Lactation Note (Signed)
 This note was copied from a baby's chart. Lactation Consultation Note  Patient Name: Felicia Davila Unijb'd Date: 04/30/2024 Age:33 hours  Reason for consult: Follow-up assessment;Early term 37-38.6wks;Infant weight loss (Eat, Sleep, Console (Xanax , Subulex-[facility] program))  P3, [redacted]w[redacted]d, ESC, 10.19% weight loss  Follow up LC visit and mother was receptive to visit. She reports her pumped milk volume is increasing and milk is transitioning to mature milk. She expressed 35 mL this am and fed to baby by bottle. In addition, baby took 15 mL of DBM. Mother is pumping with her own pump, Spectra .   Mother is aware baby will need to stay additional days and that she will be discharged. Mother is unsure if she will stay with baby while baby is the patient in the room or go home and return for visits. She plans to continue to provide her milk and supplement with donor breast milk while baby is hospitalized.   Infant has 10% weight loss. Mother states baby does not like formula and prefers breast milk. Baby is in her crib, sucking on pacifier, and having intermittent jitteriness. RN is aware. Discussed the benefit of baby getting her expressed milk. Also discussed that the baby's provider may want to increase baby's caloric intake and if so, they will discuss with parents.   Encouraged to keep pumping every 3 hrs for 15-20 minutes to promote and sustain her milk production. Call for assistance if mother would like assistance with latching baby to breast.    Feeding Mother's Current Feeding Choice: Breast Milk and Donor Milk Nipple Type: Nfant Standard Flow (white)    Lactation Tools Discussed/Used Reason for Pumping: Mother is using her personal Spectra  pump ( per choice) Pumping frequency: every 3 hrs Pumped volume: 35 mL  Interventions Interventions: Education  Discharge Discharge Education: Engorgement and breast care;Warning signs for feeding baby  Consult Status Consult Status:  Follow-up Date: 05/01/24 Follow-up type: In-patient    Joshua Line M 04/30/2024, 9:28 AM

## 2024-04-30 NOTE — Telephone Encounter (Signed)
 This is a [facility] patient she is calling because she says she delivered early and is having issues in labor and delivery. She has sent mychart messages trying to reach [place] or [place]  and wants to be seen tomorrow although she is still in the hospital. I let the patient know that I needed to reach out to Dr.Eckstat and we will then give her a call back.

## 2024-04-30 NOTE — Progress Notes (Signed)
 This RN, MD Jomarie, FOB, and pt in room together at 0730 to discuss plan of care for the day. After lengthy discussion, decision was made to stop IV Dilaudid  at 12:00pm and pt was able to have one more dose in that time period. Education was provided to patient regarding use of IV medication close to discharge, the negative impacts of stopping IV medications right before discharge, and the impacts of refusing subutex . Pt encouraged strongly to take prescribed oral medications. Pt would be discharging with oxycodone , ibuprofen , and tylenol  ([facility]). All parties verbalized understanding with no additional questions from pt or FOB.   0805: Pt called RN for IV pain medication. RN told pt the earliest she can have it is [phone], and RN reminded pt there would only be one dose administered per MD. Pt then decided to wait a little longer. Dilaudid  later given to patient at [phone] per request.   RN also brought in PO oxycodone  (15mg ) when it was able to be given at 1033. Scheduled tylenol , ibuprofen , and gabapentin  also given per eMAR.

## 2024-04-30 NOTE — Patient Instructions (Addendum)
 Your appointment with Outpatient Lactation is: Date: 05/16/2024 Time: 2:00 PM [facility] (First Floor) [address] [place]   Check in under baby's name.  Please bring your baby hungry along with your pump and a bottle of either formula or expressed breast milk. Please also bring your pump flanges and we welcome support people! If you need lactation assistance before your appointment, please call [phone] for lactation voice mail.   Please reach out to us  at:  [facility] (First Floor) ?? [address], [place], [place]  ?? [phone] Please leave a message on our lactation voicemail box. We welcome any lactation-related questions or concerns -- our team is here to support you and your baby.  Lactation Support Groups Join us  at: [facility] ?? Tuesdays, 10:00 AM - 12:00 PM ?? [address], [place] [employer], [employer]  Lactating parents and lap babies are welcome!  ?? [email or web address]  ?? [email or web address]      Geraldina Louder, [facility] Center for [facility]

## 2024-05-01 ENCOUNTER — Inpatient Hospital Stay (HOSPITAL_COMMUNITY)
Admission: AD | Admit: 2024-05-01 | Discharge: 2024-05-01 | Discharge status: Against Medical Advice | Attending: Obstetrics & Gynecology | Admitting: Obstetrics & Gynecology

## 2024-05-01 ENCOUNTER — Other Ambulatory Visit: Payer: Self-pay

## 2024-05-01 ENCOUNTER — Ambulatory Visit (INDEPENDENT_AMBULATORY_CARE_PROVIDER_SITE_OTHER): Admitting: Family Medicine

## 2024-05-01 ENCOUNTER — Encounter: Payer: Self-pay | Admitting: Family Medicine

## 2024-05-01 ENCOUNTER — Other Ambulatory Visit (HOSPITAL_COMMUNITY): Payer: Self-pay

## 2024-05-01 ENCOUNTER — Encounter: Payer: Self-pay | Admitting: Advanced Practice Midwife

## 2024-05-01 VITALS — BP 117/77 | HR 75 | Wt 204.6 lb

## 2024-05-01 DIAGNOSIS — F419 Anxiety disorder, unspecified: Secondary | ICD-10-CM | POA: Diagnosis not present

## 2024-05-01 DIAGNOSIS — Z98891 History of uterine scar from previous surgery: Secondary | ICD-10-CM | POA: Diagnosis not present

## 2024-05-01 DIAGNOSIS — D6862 Lupus anticoagulant syndrome: Secondary | ICD-10-CM | POA: Diagnosis not present

## 2024-05-01 MED ORDER — ONDANSETRON 4 MG PO TBDP
4.0000 mg | ORAL_TABLET | Freq: Three times a day (TID) | ORAL | 2 refills | Status: DC | PRN
  Filled 2024-05-01: qty 20, 7d supply, fill #0

## 2024-05-01 MED ORDER — OXYCODONE HCL 10 MG PO TABS
10.0000 mg | ORAL_TABLET | ORAL | 0 refills | Status: DC | PRN
  Filled 2024-05-01: qty 30, 5d supply, fill #0
  Filled 2024-05-02: qty 30, 6d supply, fill #0
  Filled ????-??-??: fill #0

## 2024-05-01 NOTE — Progress Notes (Signed)
PT not in lobby 

## 2024-05-01 NOTE — Progress Notes (Signed)
 Pt not in lobby. Dr Magali aware that pt has left AMA before being Triaged
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
## 2024-05-01 NOTE — Progress Notes (Signed)
 Pt called and not in lobby. Pt told admission personnel that she was going to make a phone call and has not returned at this time.
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
## 2024-05-01 NOTE — Telephone Encounter (Signed)
 Concerns addressed at visit with Lola, MD today.  Vernell RN 05/01/24

## 2024-05-02 ENCOUNTER — Encounter (HOSPITAL_COMMUNITY): Admission: RE | Payer: Self-pay | Source: Home / Self Care

## 2024-05-02 ENCOUNTER — Encounter (HOSPITAL_COMMUNITY): Payer: Self-pay | Admitting: Obstetrics and Gynecology

## 2024-05-02 ENCOUNTER — Inpatient Hospital Stay (HOSPITAL_COMMUNITY): Admission: RE | Admit: 2024-05-02 | Source: Home / Self Care | Admitting: Family Medicine

## 2024-05-02 ENCOUNTER — Other Ambulatory Visit (HOSPITAL_COMMUNITY): Payer: Self-pay

## 2024-05-02 ENCOUNTER — Encounter (HOSPITAL_COMMUNITY): Payer: Self-pay | Admitting: Anesthesiology

## 2024-05-02 DIAGNOSIS — Z98891 History of uterine scar from previous surgery: Secondary | ICD-10-CM

## 2024-05-02 SURGERY — Surgical Case
Anesthesia: Regional

## 2024-05-02 NOTE — Assessment & Plan Note (Signed)
 We discussed her pain at length. We reviewed that the focus needs to be on achieving functionality and tolerability, NOT being pain free as this is not realistic. I think its ok to give some additional pain control but she can't be taking 15 mg as she has, will drop to 10 mg and give additional Rx with a bit extra so that she has some for breakthrough control after that. Also agreed that taking her suboxone  is a great idea and will also help. Discussed we will not give further opioid rx after this.

## 2024-05-02 NOTE — Assessment & Plan Note (Signed)
 Hopefully new provider will step in, has some time, she will keep us  in the loop

## 2024-05-03 NOTE — Progress Notes (Addendum)
 CSW has completed a CPS report with [facility] regarding the infant UDS/CDS results with Intake reported [facility].  Report was screened out   Rosina Molt, [facility] Clinical Social Worker [phone]

## 2024-05-03 NOTE — Lactation Note (Signed)
 This note was copied from a baby's chart. Lactation Consultation Note  Patient Name: Felicia Davila Unijb'd Date: 05/03/2024 Age:33 days   LC asked NICU RN Oscar Darnold) to call [facility] when Mom arrives to follow-up.   Claudene Aleck BRAVO 05/03/2024, 5:40 PM

## 2024-05-04 NOTE — Lactation Note (Signed)
 This note was copied from a baby'Felicia chart.  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 Felicia Davila is a P3 and reported her plan is to provide with her breastmilk for baby. She has been pumping consistently day and night and has developed an abundant supply, praised her for all her efforts. Noticed she has only been pumping once side at a time. She couldn't figure out how to use both hoses/tubing on her Spectra  pump, she chooses to use her personal pump over the Symphony in baby'Felicia room.  Assisted with maneuvering to make her Spectra  S2 pump work for bilateral pumping. She has been using coconut oil prior to pumping to prevent any nipple trauma. Explained that we'll need to discard her milk if her nipples are bleeding or if she notices any blood residue pink milk on any of her pumping sessions due to Hepatitis C (+) status; she voiced understanding. Noticed that one bottle she brought from home didn't have a label; let her know that the milk bank won't be able to pick up any milk that doesn't have a label, assisted with labeling prior to exiting the room.   OBJECTIVE Infant data: Mother'Felicia Current Feeding Choice: Breast Milk  O2 Device: Room Air  Infant feeding assessment IDFS - Readiness: 2 IDFS - Quality: 3   Maternal data: [id number] C-Section, Low Transverse Current breast feeding challenges:: NICU admission Pumping frequency: 6 times/24 hours Pumped volume: 150 mL Risk factor for low/delayed milk supply:: C/Felicia, infant separation  Pump: Personal, DEBP (Spectra  S2)  ASSESSMENT Infant: Feeding Status: Scheduled  9-12-3-6 Feeding method: Bottle; Tube/Gavage (Bolus) Nipple Type: Dr. Jonna Fling Preemie  Maternal: Milk volume: Abundant  INTERVENTIONS/PLAN Interventions: Interventions: Breast feeding basics reviewed; DEBP; Coconut oil; Education Tools: Coconut oil; Pump  Plan: STS around care times Pump both breasts on maintain mode every 3 hours for 15 minutes; ideally 8 pumping sessions/24 hours. Use coconut oil prior to each pumping session Call for assistance if she decides taking baby to breast  No other support person at this time. All questions and concerns answered, family to contact [employer] [facility] [employer] services PRN.  Consult Status: NICU follow-up NICU Follow-up type: Weekly NICU follow up   Felicia Davila Felicia Davila 05/04/2024, 6:28 PM

## 2024-05-07 ENCOUNTER — Encounter: Payer: Self-pay | Admitting: *Deleted

## 2024-05-07 ENCOUNTER — Encounter (HOSPITAL_COMMUNITY): Payer: Self-pay | Admitting: Obstetrics and Gynecology

## 2024-05-07 ENCOUNTER — Other Ambulatory Visit (HOSPITAL_COMMUNITY): Payer: Self-pay

## 2024-05-07 ENCOUNTER — Encounter: Payer: Self-pay | Admitting: Obstetrics and Gynecology

## 2024-05-07 ENCOUNTER — Ambulatory Visit

## 2024-05-07 DIAGNOSIS — D6862 Lupus anticoagulant syndrome: Secondary | ICD-10-CM

## 2024-05-07 MED ORDER — ACETAMINOPHEN 500 MG PO TABS
1000.0000 mg | ORAL_TABLET | Freq: Four times a day (QID) | ORAL | 0 refills | Status: AC
  Filled 2024-05-07: qty 30, 4d supply, fill #0

## 2024-05-07 MED ORDER — IBUPROFEN 600 MG PO TABS
600.0000 mg | ORAL_TABLET | Freq: Four times a day (QID) | ORAL | 0 refills | Status: DC
  Filled 2024-05-07: qty 30, 8d supply, fill #0

## 2024-05-07 MED ORDER — ONDANSETRON 4 MG PO TBDP
4.0000 mg | ORAL_TABLET | Freq: Three times a day (TID) | ORAL | 1 refills | Status: AC | PRN
  Filled 2024-05-07: qty 20, 7d supply, fill #0

## 2024-05-07 NOTE — Progress Notes (Signed)
 Subjective:  Felicia Davila is a 34 y.o. female here for BP check and incision check. Pt reported does have headache, unchanged since before delivery, no blurred vision. Pt is 10 days s/p repeat low transverse C-section. Pt reported pain not well controlled in need of refills for Oxycodone .   Hypertension ROS: taking medications as instructed, no medication side effects noted, no TIA's, no chest pain on exertion, no dyspnea on exertion, and no swelling of ankles.   Pt reports incision covered, has not removed dressing.  Objective:  BP 124/79   Pulse 87   Ht 5' 7 (1.702 m)   Wt 189 lb (85.7 kg)   Breastfeeding Yes   BMI 29.60 kg/m   Appearance alert, well appearing, and in no distress. Honeycomb dressing removed, Incision healing well, no significant drainage, no dehiscence, no significant erythema   Assessment:   Blood Pressure today in office well controlled and stable.  Incision healed nicely, no signs of infection, scar looks good.  Plan:  Current treatment plan is effective, no change in therapy. Reviewed with provider regarding patient request for refills. Per Dr. Cris, and review of Dr. Lola note, unable to refill Oxycodone , received verbal for refill of Ibuprofen , Tylenol  and Zofran .   Silvano LELON Piano, RN

## 2024-05-08 ENCOUNTER — Ambulatory Visit: Admitting: Family Medicine

## 2024-05-08 ENCOUNTER — Encounter: Payer: Self-pay | Admitting: Family Medicine

## 2024-05-08 VITALS — BP 93/68 | HR 79 | Wt 196.5 lb

## 2024-05-08 DIAGNOSIS — Z98891 History of uterine scar from previous surgery: Secondary | ICD-10-CM

## 2024-05-08 DIAGNOSIS — F1191 Opioid use, unspecified, in remission: Secondary | ICD-10-CM

## 2024-05-08 DIAGNOSIS — F419 Anxiety disorder, unspecified: Secondary | ICD-10-CM
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
## 2024-05-10 ENCOUNTER — Other Ambulatory Visit (HOSPITAL_COMMUNITY): Payer: Self-pay

## 2024-05-10 ENCOUNTER — Encounter: Payer: Self-pay | Admitting: Obstetrics and Gynecology

## 2024-05-10 ENCOUNTER — Other Ambulatory Visit: Payer: Self-pay

## 2024-05-11 ENCOUNTER — Other Ambulatory Visit (HOSPITAL_COMMUNITY): Payer: Self-pay

## 2024-05-11 ENCOUNTER — Other Ambulatory Visit: Payer: Self-pay

## 2024-05-12 ENCOUNTER — Other Ambulatory Visit (HOSPITAL_COMMUNITY): Payer: Self-pay

## 2024-05-12 ENCOUNTER — Encounter: Payer: Self-pay | Admitting: Family Medicine

## 2024-05-12 LAB — TOXASSURE FLEX 15, UR
6-ACETYLMORPHINE IA: NEGATIVE ng/mL
7-aminoclonazepam: NOT DETECTED ng/mg{creat}
AMPHETAMINES IA: NEGATIVE ng/mL
Alpha-hydroxyalprazolam: 1690 ng/mg{creat}
Alpha-hydroxymidazolam: NOT DETECTED ng/mg{creat}
Alpha-hydroxytriazolam: NOT DETECTED ng/mg{creat}
Alprazolam: 921 ng/mg{creat}
BARBITURATES IA: NEGATIVE ng/mL
BUPRENORPHINE: POSITIVE
Benzodiazepines: POSITIVE
Buprenorphine: 106 ng/mg{creat}
CANNABINOIDS IA: NEGATIVE ng/mL
COCAINE METABOLITE IA: NEGATIVE ng/mL
Clonazepam: NOT DETECTED ng/mg{creat}
Creatinine: 108 mg/dL
Desalkylflurazepam: NOT DETECTED ng/mg{creat}
Desmethyldiazepam: NOT DETECTED ng/mg{creat}
Desmethylflunitrazepam: NOT DETECTED ng/mg{creat}
Diazepam: NOT DETECTED ng/mg{creat}
ETHYL ALCOHOL Enzymatic: NEGATIVE g/dL
FENTANYL: NEGATIVE
Fentanyl: NOT DETECTED ng/mg{creat}
Flunitrazepam: NOT DETECTED ng/mg{creat}
Lorazepam: NOT DETECTED ng/mg{creat}
METHADONE IA: NEGATIVE ng/mL
METHADONE MTB IA: NEGATIVE ng/mL
Midazolam: NOT DETECTED ng/mg{creat}
Norbuprenorphine: 551 ng/mg{creat}
Norfentanyl: NOT DETECTED ng/mg{creat}
Oxazepam: NOT DETECTED ng/mg{creat}
PHENCYCLIDINE IA: NEGATIVE ng/mL
TAPENTADOL, IA: NEGATIVE ng/mL
TRAMADOL IA: NEGATIVE ng/mL
Temazepam: NOT DETECTED ng/mg{creat}

## 2024-05-12 LAB — OPIATE CLASS, MS, UR RFX
Codeine: NOT DETECTED ng/mg{creat}
Dihydrocodeine: NOT DETECTED ng/mg{creat}
Hydrocodone: NOT DETECTED ng/mg{creat}
Hydromorphone: NOT DETECTED ng/mg{creat}
Morphine: NOT DETECTED ng/mg{creat}
Norcodeine: NOT DETECTED ng/mg{creat}
Norhydrocodone: NOT DETECTED ng/mg{creat}
Normorphine: NOT DETECTED ng/mg{creat}
Opiate Class Confirmation: NEGATIVE

## 2024-05-12 LAB — OXYCODONE CLASS, MS, UR RFX
Noroxycodone: >9259 ng/mg{creat}
Noroxymorphone: 3460 ng/mg{creat}
Oxycodone Class Confirmation: POSITIVE
Oxycodone: >9259 ng/mg{creat}
Oxymorphone: 5805 ng/mg{creat}

## 2024-05-13 ENCOUNTER — Encounter (HOSPITAL_COMMUNITY): Payer: Self-pay | Admitting: Obstetrics and Gynecology

## 2024-05-13 ENCOUNTER — Encounter: Payer: Self-pay | Admitting: Obstetrics and Gynecology

## 2024-05-13 ENCOUNTER — Other Ambulatory Visit (HOSPITAL_COMMUNITY): Payer: Self-pay

## 2024-05-13 ENCOUNTER — Other Ambulatory Visit: Payer: Self-pay

## 2024-05-13 ENCOUNTER — Inpatient Hospital Stay (HOSPITAL_COMMUNITY)
Admission: AD | Admit: 2024-05-13 | Discharge: 2024-05-13 | Disposition: A | Discharge status: Home / Self Care | Attending: Obstetrics and Gynecology | Admitting: Obstetrics and Gynecology

## 2024-05-13 ENCOUNTER — Ambulatory Visit: Payer: Self-pay | Admitting: Family Medicine

## 2024-05-13 ENCOUNTER — Encounter: Payer: Self-pay | Admitting: Family Medicine

## 2024-05-13 ENCOUNTER — Inpatient Hospital Stay (HOSPITAL_COMMUNITY)

## 2024-05-13 DIAGNOSIS — N939 Abnormal uterine and vaginal bleeding, unspecified: Secondary | ICD-10-CM | POA: Diagnosis not present

## 2024-05-13 DIAGNOSIS — R1030 Lower abdominal pain, unspecified: Secondary | ICD-10-CM | POA: Insufficient documentation

## 2024-05-13 DIAGNOSIS — Z5329 Procedure and treatment not carried out because of patient's decision for other reasons: Secondary | ICD-10-CM | POA: Diagnosis not present

## 2024-05-13 DIAGNOSIS — R9389 Abnormal findings on diagnostic imaging of other specified body structures: Secondary | ICD-10-CM | POA: Diagnosis not present

## 2024-05-13 DIAGNOSIS — Z98891 History of uterine scar from previous surgery: Secondary | ICD-10-CM

## 2024-05-13 DIAGNOSIS — D509 Iron deficiency anemia, unspecified: Secondary | ICD-10-CM

## 2024-05-13 LAB — CBC
HCT: 33.6 % — ABNORMAL LOW (ref 36.0–46.0)
Hemoglobin: 10.8 g/dL — ABNORMAL LOW (ref 12.0–15.0)
MCH: 28.3 pg (ref 26.0–34.0)
MCHC: 32.1 g/dL (ref 30.0–36.0)
MCV: 88.2 fL (ref 80.0–100.0)
Platelets: 266 K/uL (ref 150–400)
RBC: 3.81 MIL/uL — ABNORMAL LOW (ref 3.87–5.11)
RDW: 13.7 % (ref 11.5–15.5)
WBC: 7.5 K/uL (ref 4.0–10.5)
nRBC: 0 % (ref 0.0–0.2)

## 2024-05-13 MED ORDER — FERRIC MALTOL 30 MG PO CAPS
1.0000 | ORAL_CAPSULE | Freq: Two times a day (BID) | ORAL | 2 refills | Status: AC
  Filled 2024-05-13: qty 60, 30d supply, fill #0

## 2024-05-13 MED ORDER — KETOROLAC TROMETHAMINE 30 MG/ML IJ SOLN
30.0000 mg | Freq: Once | INTRAMUSCULAR | Status: DC
  Filled 2024-05-13: qty 1

## 2024-05-13 MED ORDER — ONDANSETRON 4 MG PO TBDP
ORAL_TABLET | ORAL | Status: AC
  Filled 2024-05-13: qty 2

## 2024-05-13 MED ORDER — ONDANSETRON 4 MG PO TBDP
8.0000 mg | ORAL_TABLET | Freq: Once | ORAL | Status: AC
  Administered 2024-05-13: 8 mg via ORAL

## 2024-05-13 MED ORDER — KETOROLAC TROMETHAMINE 30 MG/ML IJ SOLN
30.0000 mg | Freq: Once | INTRAMUSCULAR | Status: AC
  Administered 2024-05-13: 30 mg via INTRAMUSCULAR

## 2024-05-13 NOTE — MAU Note (Signed)
 Patient refused to sign discharge paperwork even after receiving MSE & second opinion from Dr. Erik and stating she was agreeable to the plan of care while provider was at the bedside. Patient also continuously calling out for pain medication while in MAU despite ibuprofen  at 0200 & 30mg  IM toradol  at 0522. Patient cleared for discharge by provider and left - ambulatory, alert, and oriented x 4 at 0950. Provider notified that patient refused to sign paperwork.
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
## 2024-05-13 NOTE — MAU Provider Note (Signed)
 " Event Date/Time   First Provider Initiated Contact with Patient 05/13/24 [phone]     Chief Complaint Vaginal bleeding with blood clots status post C-section 04/27/24 for elective repeat C-section  S/HPI Ms. Felicia Davila is a 34 y.o. [id number] patient who presents to MAU today with complaint of patient states that she was at [facility] last night for symptoms of vaginal bleeding lower abdominal pain that started at 1800.  She has been passing golf ball sized clots and changing pad every 1-2 hours for hygiene.  Patient reports she left AMA from the hospital and the provider had called her after she left and told her to come to the ED for concerns of retained products of conception seen on their ultrasound.    Her pregnancy was complicated by history of lupus, STD and history of C-section x 2.  She had an uncomplicated postpartum course recently seen in the office by Dr. Lola on 05/08/24  Objective  BP 106/64   Pulse 81   Temp 98.2 F (36.8 C) (Oral)   Ht 5' 7 (1.702 m)   Wt 89.8 kg   SpO2 96%   Breastfeeding Yes   BMI 31.00 kg/m  Physical Exam Vitals and nursing note reviewed. Exam conducted with a chaperone present.  Constitutional:      General: She is not in acute distress.    Appearance: She is not ill-appearing.  HENT:     Head: Normocephalic.  Cardiovascular:     Rate and Rhythm: Normal rate.  Pulmonary:     Effort: Pulmonary effort is normal.  Abdominal:     General: A surgical scar is present.     Tenderness: There is no abdominal tenderness.     Comments: Surgical incision from C-section is clean dry and intact  Genitourinary:    Comments: Deferred for TVS  Skin:    General: Skin is warm.  Neurological:     Mental Status: She is alert and oriented to person, place, and time.  Psychiatric:        Mood and Affect: Mood is anxious.     MDM  HIGH  Vaginal bleeding PP with Blood clots  TVS ordered  CBC Pain management Toradol  given x  1  @ 0644 /Awaiting final report from radiology due to concern for Blood in the lower uterine segment with concern for retained POC  R/O rPOC   Orders Placed This Encounter  Procedures   US  PELVIS TRANSVAGINAL NON-OB (TV ONLY)    Standing Status:   Standing    Number of Occurrences:   1    Symptom/Reason for Exam:   Vaginal bleeding [[id number]]    Symptom/Reason for Exam:   Bleeding postpartum [[id number]]   CBC    Standing Status:   Standing    Number of Occurrences:   1    Study Result  Narrative & Impression  EXAM: PELVIC ULTRASOUND 05/13/2024 05:02:46 AM   TECHNIQUE: Transvaginal pelvic duplex ultrasound using B-mode/gray scaled imaging with color flow was obtained. Color Doppler evaluation was obtained of the right ovary without spectral Doppler analysis.   COMPARISON: Comparison is made with prepartum ob ultrasound 04/16/2024.   The patient reportedly had an ultrasound at [facility] yesterday, but the images and results are unavailable at this time.   CLINICAL HISTORY: The patient is 2 weeks postpartum and continues to pass clots. Underwent C-SECTION 2 weeks ago.   FINDINGS:   ULTRASOUND FINDINGS: UTERUS: Uterus is retroverted, measuring 10.6 x 7.4  x 8.2 cm. Uterus demonstrates normal myometrial echotexture.   There is a heterogeneous, mostly hypoechoic blood clot in the cavity measuring 7 x 3.5 x 4.0 cm and extending to the serosal surface at the ventral lower uterine segment site of the cesarean incision.   No abnormal vascularity was seen in the endometrial layer or within the blood clot. There is funneling of the internal cervical os and trace fluid or blood in the endocervical canal.   ENDOMETRIAL STRIPE: Endometrial layer measures 18 mm. There is a heterogeneous, mostly hypoechoic blood clot in the cavity measuring 7 x 3.5 x 4.0 cm and extending to the serosal surface at the ventral lower uterine segment and site of the cesarean incision.   No  abnormal vascularity was seen in the endometrial layer or within the blood clot.   RIGHT OVARY: Right ovary is unremarkable, measuring 7.1 ml in volume. There is normal vascularity in the right ovary with color flow Doppler examination.   LEFT OVARY: The left ovary could not be visualized.   FREE FLUID: There is a small volume of anechoic free fluid.   IMPRESSION: 1. Thickened endometrium with large avascular intrauterine blood clot extending to the ventral lower uterine segment cesarean incision site, without sonographic evidence of retained products of conception. 2. Funneling of the internal cervical os with trace fluid or blood in the endocervical canal.   Electronically signed by: Francis Quam MD 05/13/2024 07:05 AM EST RP Workstation: [id number]      ASSESSMENT/PLAN Medical screening exam complete  Vaginal bleeding No evidence of products of conception Normal postpartum bleeding CBC stable continue PO iron    Postpartum state Routine postpartum care Follow-up with Dr. Lola  as scheduled  H/O cesarean section Incision is clean dry and intact   Discharge from MAU in stable condition  See AVS for full description of educational information and instructions provided to the patient at time of discharge   Warning signs for worsening condition that would warrant emergency follow-up discussed  Patient may return to MAU as needed   Future Appointments  Date Time Provider Department Center  05/29/2024  1:55 PM [name], MD [facility]  05/31/2024  2:30 PM [name], MD [facility]  06/11/2024 10:50 AM [name], MD [facility]    ----------------------------------------------------------------------- [name], MSN, [facility], Center for [facility]   "

## 2024-05-13 NOTE — MAU Note (Signed)
 Patient currently refusing discharge. States that she wants a second opinion from the provider that comes on at 8am and that she doesn't understand how the ED at [facility] could tell her that she had retained POC and needed to be seen immediately while our US  was normal. Patient also states that if the new provider concurs that she can go home based off of her new US  results, that she will not sign discharge paperwork because she doesn't agree with her plan of care.

## 2024-05-13 NOTE — MAU Note (Signed)
 Felicia Davila is a 34 y.o. at Unknown here in MAU reporting: vaginal bleeding and lower abdominal pain that started at 1800. Patient states that she has not had any bleeding since 2 days postpartum. She reports passing gold ball sized clots and changing a pad every 1-2 hours more so for hygiene. She took ibuprofen  at about 0200 am. She was seen at [facility] last night for symptoms. She left there AMA and states that the provider called her after she left and told her to come straight to the ED for concerns of POC seen on u/s.  Onset of complaint: yesterday evening Pain score: 9/10 Vitals:   05/13/24 0404  BP: 111/71  Pulse: 89  Temp: 98.2 F (36.8 C)  SpO2: 97%      Lab orders placed from triage: none

## 2024-05-14 ENCOUNTER — Telehealth: Payer: Self-pay

## 2024-05-14 ENCOUNTER — Other Ambulatory Visit: Payer: Self-pay

## 2024-05-14 ENCOUNTER — Encounter (HOSPITAL_COMMUNITY): Payer: Self-pay

## 2024-05-14 NOTE — Telephone Encounter (Signed)
 RN received message from front office to call patient to be triaged. RN returned patient call. Pt reported in need of follow at office and wanting to discuss MAU visit and how she is currently feeling. Pt reported went to MAU due to passing clots, saw Dr. Erik. Pt reported feels like abdomen is swelling, can hardly walk, pain rating 9/10, when stands up worsens, has gushes. Pt reported changing maxi pads at least 10 x day. RN reviewed provider recommendations for follow up if severe uncontrolled pain and or heavy vaginal bleeding. RN advised for patient to return to MAU. Pt verbalized understanding, does not have driver currently, will have driver this evening. Pt reported nearest hospital to her is [place] and can be transferred if needed.  Pt reported may drive herself to [place] as not currently reporting dizziness or weakness. Patient scheduled for follow up with Dr. Erik. Pt reported feeling terrible and worried may be dead in the next 10 days. RN advised for patient to call 911. Pt verbalized understanding and reported would plan to be evaluated.  Silvano LELON Piano, RN

## 2024-05-14 NOTE — Telephone Encounter (Signed)
 Reviewed with Dr. Cleatus, patient seen in MAU after message in mychart with plan reviewed by Dr. Erik. See MAU note from provider.   Silvano LELON Piano, RN

## 2024-05-15 ENCOUNTER — Other Ambulatory Visit: Payer: Self-pay

## 2024-05-15 ENCOUNTER — Ambulatory Visit: Payer: Self-pay | Admitting: Family Medicine

## 2024-05-15 ENCOUNTER — Other Ambulatory Visit (HOSPITAL_COMMUNITY): Payer: Self-pay

## 2024-05-15 ENCOUNTER — Encounter: Payer: Self-pay | Admitting: Family Medicine

## 2024-05-15 ENCOUNTER — Telehealth (INDEPENDENT_AMBULATORY_CARE_PROVIDER_SITE_OTHER): Admitting: Family Medicine

## 2024-05-15 DIAGNOSIS — F410 Panic disorder [episodic paroxysmal anxiety] without agoraphobia: Secondary | ICD-10-CM

## 2024-05-15 DIAGNOSIS — Z98891 History of uterine scar from previous surgery: Secondary | ICD-10-CM

## 2024-05-15 DIAGNOSIS — F419 Anxiety disorder, unspecified: Secondary | ICD-10-CM

## 2024-05-15 DIAGNOSIS — F1191 Opioid use, unspecified, in remission: Secondary | ICD-10-CM | POA: Diagnosis not present

## 2024-05-15 MED ORDER — FLUOXETINE HCL 10 MG PO CAPS
10.0000 mg | ORAL_CAPSULE | Freq: Every day | ORAL | 3 refills | Status: AC
  Filled 2024-05-15: qty 90, 90d supply, fill #0
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
## 2024-05-15 NOTE — Assessment & Plan Note (Signed)
 Extensive chart review, no othe rx of administration in chart or Care Everywhere anywhere during her pregnancy. I encouraged her to be upfront and honest as this is the best thing in my experience, and also reminded her that she has had 100% consistent UDS with us  during her pregnancy.

## 2024-05-15 NOTE — Assessment & Plan Note (Addendum)
 Provided reassurance. Discussed methergine vs ongoing conservative management, favors the latter as she doesn't want to be in more pain which is reasonable. If no improvement after about two weeks we can attempt methergine vs revisit miso or D&C.

## 2024-05-15 NOTE — Assessment & Plan Note (Signed)
 Discussed options, including increasing prozac  dose, adding on adjunctive therapy with wellbutrin, or adjunctive therapy with abilify. Second not an option from prior bad reaction. I would prefer to only do third option if needed. Agrees to increase prozac  dose to 30 mg daily and I asked her to discuss with primary psych provider as well and get their thoughts.

## 2024-05-17 NOTE — Lactation Note (Signed)
 This note was copied from a baby's chart.  NICU Lactation Consultation Note  Patient Name: Felicia Davila Date: 05/17/2024 Age:33 wk.o.  Reason for consult: Follow-up assessment; NICU baby; Exclusive pumping and bottle feeding; Term  SUBJECTIVE  LC in to meet up with P3 Mom of baby Felicia Davila on the day of baby's discharge from the NICU.  Mom is a member of the [facility].  Baby admitted to the NICU for poor feedings due to NAS.  Baby has been ad lib and is stable with her weight.  Mom states her milk supply has dropped some, but she is pumping every 3 hrs, but one breast at a time.  Mom using a Spectra  pump, with larger flanges, 24/28 mm that comes with it.  Educated Mom on ordering smaller flanges for the Spectra  pump online.  Mom's breast were full and leaking.  Mom states she had never used the pump in the room, but states she is willing to try it.  LC provided a pumping band and size 21 mm flanges.  Mom assisted to pump on maintain mode, both at the same time.  Mom states she usually pumps one breast at a time.  Mom has WIC in [place].  LC faxed a [facility] referral asking if Mom could have a hospital grade pump to boost her milk supply.  LC offered a 2 week [facility] loaner for $30 refundable deposit but Mom politely declined.  Mom encouraged to pump every 3 hrs for 20 mins both breasts at the same time.  OBJECTIVE Infant data: No data recorded O2 Device: Room Air  Infant feeding assessment IDFS - Readiness: 1 IDFS - Quality: 3   Maternal data: [id number] C-Section, Low Transverse Pumping frequency: every 3 hrs now after not pumping for a day and a half. Pumped volume: 50 mL Flange Size: 21 Hands-free pumping top sizes: Large Alejos)  WIC Program: Yes WIC Referral Sent?: Yes What county?: Other [facility]) Pump: DEBP, Personal (Spectra  S2, offered a [facility] loaner)  ASSESSMENT Infant:  Feeding Status: Ad lib Feeding method: Bottle Nipple Type: Dr. Jonna Fling  Preemie  Maternal: Milk volume: Normal (low normal)  INTERVENTIONS/PLAN Interventions: Interventions: Breast feeding basics reviewed; Skin to skin; Breast massage; Hand express; DEBP; Hand pump; Education Discharge Education: Outpatient recommendation; Outpatient Epic message sent Tools: Pump; Flanges; Hands-free pumping top Pump Education: Setup, frequency, and cleaning; Milk Storage  Plan: Consult Status: Complete NICU Follow-up type: Baby's discharge   Claudene Aleck BRAVO 05/17/2024, 6:29 PM

## 2024-05-18 ENCOUNTER — Other Ambulatory Visit (HOSPITAL_COMMUNITY): Payer: Self-pay

## 2024-05-18 MED ORDER — ALPRAZOLAM 1 MG PO TABS: 1.0000 mg | ORAL_TABLET | Freq: Three times a day (TID) | ORAL | 0 refills | Status: DC

## 2024-05-18 MED ORDER — FLUOXETINE HCL 10 MG PO TABS
30.0000 mg | ORAL_TABLET | Freq: Every day | ORAL | 4 refills | Status: AC
  Filled 2024-05-18: qty 270, 90d supply, fill #0

## 2024-05-21 ENCOUNTER — Encounter: Payer: Self-pay | Admitting: Family Medicine

## 2024-05-21 ENCOUNTER — Other Ambulatory Visit: Payer: Self-pay | Admitting: Obstetrics & Gynecology

## 2024-05-23 ENCOUNTER — Encounter (HOSPITAL_COMMUNITY): Payer: Self-pay | Admitting: Obstetrics and Gynecology

## 2024-05-23 ENCOUNTER — Other Ambulatory Visit (HOSPITAL_COMMUNITY): Payer: Self-pay

## 2024-05-23 MED ORDER — BUPRENORPHINE HCL 8 MG SL SUBL
8.0000 mg | SUBLINGUAL_TABLET | Freq: Three times a day (TID) | SUBLINGUAL | 1 refills | Status: AC
  Filled 2024-05-23: qty 90, 30d supply, fill #0

## 2024-05-23 MED ORDER — ALPRAZOLAM 1 MG PO TABS
1.0000 mg | ORAL_TABLET | Freq: Three times a day (TID) | ORAL | 1 refills | Status: AC | PRN
  Filled 2024-05-23 – 2024-05-28 (×4): qty 90, 30d supply, fill #0
  Filled ????-??-?? (×3): fill #0

## 2024-05-24 ENCOUNTER — Ambulatory Visit: Admitting: Obstetrics and Gynecology

## 2024-05-25 ENCOUNTER — Encounter: Payer: Self-pay | Admitting: Family Medicine

## 2024-05-25 ENCOUNTER — Other Ambulatory Visit (HOSPITAL_COMMUNITY): Payer: Self-pay

## 2024-05-25 MED ORDER — IBUPROFEN 600 MG PO TABS
600.0000 mg | ORAL_TABLET | Freq: Four times a day (QID) | ORAL | 0 refills | Status: AC
  Filled 2024-05-25: qty 30, 8d supply, fill #0

## 2024-05-28 ENCOUNTER — Other Ambulatory Visit (HOSPITAL_COMMUNITY): Payer: Self-pay

## 2024-05-28 ENCOUNTER — Other Ambulatory Visit: Payer: Self-pay

## 2024-05-29 ENCOUNTER — Encounter (HOSPITAL_COMMUNITY): Payer: Self-pay

## 2024-05-29 ENCOUNTER — Ambulatory Visit: Admitting: Family Medicine

## 2024-05-31 ENCOUNTER — Ambulatory Visit: Admitting: Obstetrics and Gynecology

## 2024-05-31 ENCOUNTER — Encounter: Payer: Self-pay | Admitting: Obstetrics and Gynecology

## 2024-05-31 DIAGNOSIS — Z3042 Encounter for surveillance of injectable contraceptive: Secondary | ICD-10-CM

## 2024-05-31 DIAGNOSIS — M329 Systemic lupus erythematosus, unspecified: Secondary | ICD-10-CM

## 2024-05-31 DIAGNOSIS — D509 Iron deficiency anemia, unspecified: Secondary | ICD-10-CM

## 2024-05-31 DIAGNOSIS — R9389 Abnormal findings on diagnostic imaging of other specified body structures: Secondary | ICD-10-CM

## 2024-05-31 MED ORDER — SLYND 4 MG PO TABS: 1.0000 | ORAL_TABLET | Freq: Every day | ORAL | 3 refills | Status: AC

## 2024-05-31 MED ORDER — MEDROXYPROGESTERONE ACETATE 150 MG/ML IM SUSY
150.0000 mg | PREFILLED_SYRINGE | Freq: Once | INTRAMUSCULAR | Status: AC
  Administered 2024-05-31: 150 mg via INTRAMUSCULAR

## 2024-05-31 MED ORDER — PREDNISONE 5 MG PO TABS: 10.0000 mg | ORAL_TABLET | Freq: Every day | ORAL | 0 refills | Status: AC

## 2024-05-31 NOTE — Progress Notes (Signed)
 "   Post Partum Visit Note  Felicia Davila is a 34 y.o. [phone] female who presents for a postpartum visit. She is 4 weeks postpartum following a repeat cesarean section.  I have fully reviewed the prenatal and intrapartum course. The delivery was at term.  Anesthesia: spinal. Postpartum course has been somewhat complicated by her iron  def anemia, lupus and flares and retained blood. She has dizziness and heart palpitations. Baby is doing well - no longer in NICU. Baby is feeding by bottle - Enfamil Nutramigen. Bleeding brown. Bowel function is normal. Bladder function is normal. Patient is sexually active. Contraception method is Depo-Provera  injections. Postpartum depression screening: negative.   The pregnancy intention screening data noted above was reviewed. Potential methods of contraception were discussed. The patient elected to proceed with No data recorded.    Health Maintenance Due  Topic Date Due   COVID-19 Vaccine (1) Never done   DTaP/Tdap/Td (6 - Td or Tdap) 04/09/2018   Influenza Vaccine  Never done    The following portions of the patient's history were reviewed and updated as appropriate: allergies, current medications, past family history, past medical history, past social history, past surgical history, and problem list.  Review of Systems Pertinent items are noted in HPI.  Objective:  BP 110/74   Pulse (!) 51   Ht 5' 6 (1.676 m)   Wt 192 lb (87.1 kg)   Breastfeeding No   BMI 30.99 kg/m    General:  alert, cooperative, and no distress   Breasts:  not indicated  Lungs: Normal effort  Heart:  Regular rate  Abdomen: soft, non-tender; bowel sounds normal; no masses,  no organomegaly   Wound well approximated incision, small sutures still present and removed from incision  GU exam:  not indicated       Assessment:   Journe was seen today for postpartum care.  Diagnoses and all orders for this visit:  Postpartum care and examination -      medroxyPROGESTERone  Acetate SUSY 150 mg -     Drospirenone  (SLYND ) 4 MG TABS; Take 1 tablet (4 mg total) by mouth daily.  Thickened endometrium -     US  PELVIC COMPLETE WITH TRANSVAGINAL; Future  Exacerbation of systemic lupus (HCC) -     predniSONE  (DELTASONE ) 5 MG tablet; Take 2 tablets (10 mg total) by mouth daily with breakfast.  Iron  deficiency anemia, unspecified iron  deficiency anemia type -     Ambulatory referral to Hematology / Oncology  Encounter for management and injection of depo-Provera  -     POCT urine pregnancy   Plan:   Essential components of care per ACOG recommendations:  1.  Mood and well being: Patient with negative depression screening today. Reviewed local resources for support.  - Patient tobacco use? No.   - hx of drug use? Yes (Benzodiazepines - prescribed). Discussed support systems and outpatient/inpatient treatment options.    2. Infant care and feeding:  -Patient currently breastmilk feeding? No.  -Social determinants of health (SDOH) reviewed in EPIC. No concerns  3. Sexuality, contraception and birth spacing - Patient does not want a pregnancy in the next year.  Desired family size is 3 children.  - Reviewed reproductive life planning. Reviewed contraceptive methods based on pt preferences and effectiveness.  Patient desired Hormonal Injection today.   - Discussed birth spacing of 18 months  4. Sleep and fatigue -Encouraged family/partner/community support of 4 hrs of uninterrupted sleep to help with mood and fatigue  5. Physical  Recovery  - Discussed patients delivery and complications. She describes her labor as good. - Patient had a C-section.  - Patient has urinary incontinence? No. - Patient is safe to resume physical and sexual activity in one week  6.  Health Maintenance - HM due items addressed Yes - Last pap smear  Diagnosis  Date Value Ref Range Status  01/11/2022   Final   - Negative for intraepithelial lesion or malignancy  (NILM)   Pap smear not done at today's visit.  -Breast Cancer screening indicated? No.   7. Chronic Disease/Pregnancy Condition follow up: Anemia and SLE - F/u with Rheum and Hematology - Long term disability forms filled out for until 12 weeks postpartum to give her time to address ongoing iron  def anemia and associated symptoms and her SLE flares.  - Recheck US  to see EL.  - PCP follow up  Vina Solian, MD [facility], [facility]  "

## 2024-06-04 ENCOUNTER — Other Ambulatory Visit

## 2024-06-05 ENCOUNTER — Ambulatory Visit (HOSPITAL_COMMUNITY)

## 2024-06-05 ENCOUNTER — Ambulatory Visit: Admitting: Family Medicine

## 2024-06-07 ENCOUNTER — Encounter (HOSPITAL_COMMUNITY)

## 2024-06-11 ENCOUNTER — Ambulatory Visit: Admitting: Obstetrics and Gynecology
